# Patient Record
Sex: Male | Born: 1935 | Race: White | Hispanic: No | Smoking: Former smoker
Health system: Southern US, Community
[De-identification: ages and names within clinical notes are randomized; demographics above are authoritative.]

## PROBLEM LIST (undated history)

## (undated) DIAGNOSIS — I219 Acute myocardial infarction, unspecified: Secondary | ICD-10-CM

## (undated) DIAGNOSIS — E11621 Type 2 diabetes mellitus with foot ulcer: Secondary | ICD-10-CM

## (undated) DIAGNOSIS — E119 Type 2 diabetes mellitus without complications: Secondary | ICD-10-CM

## (undated) DIAGNOSIS — I2510 Atherosclerotic heart disease of native coronary artery without angina pectoris: Secondary | ICD-10-CM

## (undated) DIAGNOSIS — G629 Polyneuropathy, unspecified: Secondary | ICD-10-CM

## (undated) DIAGNOSIS — K219 Gastro-esophageal reflux disease without esophagitis: Secondary | ICD-10-CM

## (undated) DIAGNOSIS — T4145XA Adverse effect of unspecified anesthetic, initial encounter: Secondary | ICD-10-CM

## (undated) DIAGNOSIS — E559 Vitamin D deficiency, unspecified: Secondary | ICD-10-CM

## (undated) DIAGNOSIS — M810 Age-related osteoporosis without current pathological fracture: Secondary | ICD-10-CM

## (undated) DIAGNOSIS — E785 Hyperlipidemia, unspecified: Secondary | ICD-10-CM

## (undated) DIAGNOSIS — M1990 Unspecified osteoarthritis, unspecified site: Secondary | ICD-10-CM

## (undated) DIAGNOSIS — E042 Nontoxic multinodular goiter: Secondary | ICD-10-CM

## (undated) DIAGNOSIS — K633 Ulcer of intestine: Secondary | ICD-10-CM

## (undated) DIAGNOSIS — I209 Angina pectoris, unspecified: Secondary | ICD-10-CM

## (undated) DIAGNOSIS — T8859XA Other complications of anesthesia, initial encounter: Secondary | ICD-10-CM

## (undated) DIAGNOSIS — L97529 Non-pressure chronic ulcer of other part of left foot with unspecified severity: Secondary | ICD-10-CM

## (undated) HISTORY — PX: OTHER SURGICAL HISTORY: SHX169

## (undated) HISTORY — PX: CARPAL TUNNEL RELEASE: SHX101

## (undated) HISTORY — PX: JOINT REPLACEMENT: SHX530

## (undated) HISTORY — PX: SKIN LESION EXCISION: SHX2412

---

## 2011-12-06 HISTORY — PX: ABDOMINAL AORTIC ANEURYSM REPAIR: SUR1152

## 2018-05-23 ENCOUNTER — Other Ambulatory Visit: Payer: Self-pay | Admitting: Podiatry

## 2018-05-24 ENCOUNTER — Encounter
Admission: RE | Admit: 2018-05-24 | Discharge: 2018-05-24 | Disposition: A | Discharge status: Home / Self Care | Payer: Medicare Other | Source: Ambulatory Visit | Attending: Podiatry | Admitting: Podiatry

## 2018-05-24 ENCOUNTER — Other Ambulatory Visit: Payer: Self-pay

## 2018-05-24 DIAGNOSIS — Z01812 Encounter for preprocedural laboratory examination: Secondary | ICD-10-CM | POA: Diagnosis not present

## 2018-05-24 HISTORY — DX: Age-related osteoporosis without current pathological fracture: M81.0

## 2018-05-24 HISTORY — DX: Gastro-esophageal reflux disease without esophagitis: K21.9

## 2018-05-24 HISTORY — DX: Type 2 diabetes mellitus without complications: E11.9

## 2018-05-24 HISTORY — DX: Hyperlipidemia, unspecified: E78.5

## 2018-05-24 HISTORY — DX: Unspecified osteoarthritis, unspecified site: M19.90

## 2018-05-24 HISTORY — DX: Nontoxic multinodular goiter: E04.2

## 2018-05-24 HISTORY — DX: Vitamin D deficiency, unspecified: E55.9

## 2018-05-24 HISTORY — DX: Ulcer of intestine: K63.3

## 2018-05-24 LAB — BASIC METABOLIC PANEL
Anion gap: 8 (ref 5–15)
BUN: 15 mg/dL (ref 6–20)
CALCIUM: 9.8 mg/dL (ref 8.9–10.3)
CHLORIDE: 100 mmol/L — AB (ref 101–111)
CO2: 27 mmol/L (ref 22–32)
CREATININE: 0.68 mg/dL (ref 0.61–1.24)
GFR calc Af Amer: >60 mL/min (ref >60)
GFR calc non Af Amer: >60 mL/min (ref >60)
Glucose, Bld: 383 mg/dL — ABNORMAL HIGH (ref 65–99)
Potassium: 4.3 mmol/L (ref 3.5–5.1)
Sodium: 135 mmol/L (ref 135–145)

## 2018-05-24 NOTE — Pre-Procedure Instructions (Signed)
LABS/REQUEST FOR OPTIMIZATION CALLED AND FAXED TO DR B Suezanne JacquetSHANER. ALSO CALLED AND FAXED FYI TO DR Ether GriffinsFOWLER

## 2018-05-24 NOTE — Patient Instructions (Signed)
Your procedure is scheduled on: Friday 06/01/18 Report to Day Surgery. To find out your arrival time please call [phone] between 1PM - 3PM on Thurs. 6/27.  Remember: Instructions that are not followed completely may result in serious medical risk, up to and including death, or upon the discretion of your surgeon and anesthesiologist your surgery may need to be rescheduled.     _X__ 1. Do not eat food after midnight the night before your procedure.                 No gum chewing or hard candies. You may drink clear liquids up to 2 hours                 before you are scheduled to arrive for your surgery- DO not drink clear                 liquids within 2 hours of the start of your surgery.                 Clear Liquids include:  water,  __X__2.  On the morning of surgery brush your teeth with toothpaste and water, you may rinse your mouth with mouthwash if you wish.  Do not swallow any              toothpaste of mouthwash.     _X__ 3.  No Alcohol for 24 hours before or after surgery.   ___ 4.  Do Not Smoke or use e-cigarettes For 24 Hours Prior to Your Surgery.                 Do not use any chewable tobacco products for at least 6 hours prior to                 surgery.  ____  5.  Bring all medications with you on the day of surgery if instructed.   __x__  6.  Notify your doctor if there is any change in your medical condition      (cold, fever, infections).     Do not wear jewelry, make-up, hairpins, clips or nail polish. Do not wear lotions, powders, or perfumes. You may wear deodorant. Do not shave 48 hours prior to surgery. Men may shave face and neck. Do not bring valuables to the hospital.    [facility] is not responsible for any belongings or valuables.  Contacts, dentures or bridgework may not be worn into surgery. Leave your suitcase in the car. After surgery it may be brought to your room. For patients admitted to the hospital, discharge time  is determined by your treatment team.   Patients discharged the day of surgery will not be allowed to drive home.   Please read over the following fact sheets that you were given:    _x___ Take these medicines the morning of surgery with A SIP OF WATER:    1. Omeprazole take an extra dose the night before and morning of surgery  2. sertraline   3. atorvastatin  4.Lyrica  5.  6.  ____ Fleet Enema (as directed)   __x__ Use CHG Soap as directed  __x__ Use inhalers on the day of surgery albuterol and bring to hospital  ____ Stop metformin 2 days prior to surgery    ____ Take 1/2 of usual insulin dose the night before surgery. No insulin the morning          of surgery.   ____ Stop  Coumadin/Plavix/aspirin on   ____ Stop Anti-inflammatories on    ____ Stop supplements until after surgery.    ____ Bring C-Pap to the hospital.

## 2018-05-31 NOTE — Pre-Procedure Instructions (Signed)
NOTE IN CHART,PCP DID NOT CLEAR PATIENT AND NOTIFYING DR Antonietta BarcelonaFOWER

## 2018-06-08 ENCOUNTER — Ambulatory Visit: Admission: RE | Admit: 2018-06-08 | Payer: Medicare Other | Source: Ambulatory Visit | Admitting: Podiatry

## 2018-06-08 ENCOUNTER — Encounter: Admission: RE | Payer: Self-pay | Source: Ambulatory Visit

## 2018-06-08 SURGERY — LENGTHENING, TENDON
Anesthesia: Choice | Laterality: Left

## 2018-09-19 ENCOUNTER — Other Ambulatory Visit: Payer: Self-pay | Admitting: Podiatry

## 2018-09-25 ENCOUNTER — Other Ambulatory Visit: Payer: Self-pay

## 2018-09-25 ENCOUNTER — Encounter
Admission: RE | Admit: 2018-09-25 | Discharge: 2018-09-25 | Disposition: A | Discharge status: Home / Self Care | Payer: Medicare PPO | Source: Ambulatory Visit | Attending: Podiatry | Admitting: Podiatry

## 2018-09-25 DIAGNOSIS — Z880 Allergy status to penicillin: Secondary | ICD-10-CM | POA: Diagnosis not present

## 2018-09-25 DIAGNOSIS — Z87891 Personal history of nicotine dependence: Secondary | ICD-10-CM | POA: Diagnosis not present

## 2018-09-25 DIAGNOSIS — Z79899 Other long term (current) drug therapy: Secondary | ICD-10-CM | POA: Diagnosis not present

## 2018-09-25 DIAGNOSIS — E890 Postprocedural hypothyroidism: Secondary | ICD-10-CM | POA: Diagnosis not present

## 2018-09-25 DIAGNOSIS — Z882 Allergy status to sulfonamides status: Secondary | ICD-10-CM | POA: Diagnosis not present

## 2018-09-25 DIAGNOSIS — M810 Age-related osteoporosis without current pathological fracture: Secondary | ICD-10-CM | POA: Diagnosis not present

## 2018-09-25 DIAGNOSIS — L97522 Non-pressure chronic ulcer of other part of left foot with fat layer exposed: Secondary | ICD-10-CM

## 2018-09-25 DIAGNOSIS — E11621 Type 2 diabetes mellitus with foot ulcer: Secondary | ICD-10-CM | POA: Diagnosis present

## 2018-09-25 DIAGNOSIS — L97521 Non-pressure chronic ulcer of other part of left foot limited to breakdown of skin: Secondary | ICD-10-CM | POA: Diagnosis not present

## 2018-09-25 DIAGNOSIS — E1140 Type 2 diabetes mellitus with diabetic neuropathy, unspecified: Secondary | ICD-10-CM | POA: Diagnosis not present

## 2018-09-25 DIAGNOSIS — K219 Gastro-esophageal reflux disease without esophagitis: Secondary | ICD-10-CM | POA: Diagnosis not present

## 2018-09-25 DIAGNOSIS — Z01818 Encounter for other preprocedural examination: Secondary | ICD-10-CM

## 2018-09-25 DIAGNOSIS — Z888 Allergy status to other drugs, medicaments and biological substances status: Secondary | ICD-10-CM | POA: Diagnosis not present

## 2018-09-25 DIAGNOSIS — M216X2 Other acquired deformities of left foot: Secondary | ICD-10-CM | POA: Diagnosis present

## 2018-09-25 DIAGNOSIS — Z794 Long term (current) use of insulin: Secondary | ICD-10-CM | POA: Diagnosis not present

## 2018-09-25 DIAGNOSIS — Z902 Acquired absence of lung [part of]: Secondary | ICD-10-CM | POA: Diagnosis not present

## 2018-09-25 DIAGNOSIS — Z96651 Presence of right artificial knee joint: Secondary | ICD-10-CM | POA: Diagnosis not present

## 2018-09-25 DIAGNOSIS — E785 Hyperlipidemia, unspecified: Secondary | ICD-10-CM | POA: Diagnosis not present

## 2018-09-25 DIAGNOSIS — Z7982 Long term (current) use of aspirin: Secondary | ICD-10-CM | POA: Diagnosis not present

## 2018-09-25 DIAGNOSIS — Z7951 Long term (current) use of inhaled steroids: Secondary | ICD-10-CM | POA: Diagnosis not present

## 2018-09-25 DIAGNOSIS — I252 Old myocardial infarction: Secondary | ICD-10-CM | POA: Diagnosis not present

## 2018-09-25 DIAGNOSIS — I25118 Atherosclerotic heart disease of native coronary artery with other forms of angina pectoris: Secondary | ICD-10-CM | POA: Diagnosis not present

## 2018-09-25 HISTORY — DX: Acute myocardial infarction, unspecified: I21.9

## 2018-09-25 HISTORY — DX: Polyneuropathy, unspecified: G62.9

## 2018-09-25 HISTORY — DX: Type 2 diabetes mellitus with foot ulcer: E11.621

## 2018-09-25 HISTORY — DX: Angina pectoris, unspecified: I20.9

## 2018-09-25 HISTORY — DX: Non-pressure chronic ulcer of other part of left foot with unspecified severity: L97.529

## 2018-09-25 HISTORY — DX: Adverse effect of unspecified anesthetic, initial encounter: T41.45XA

## 2018-09-25 HISTORY — DX: Atherosclerotic heart disease of native coronary artery without angina pectoris: I25.10

## 2018-09-25 HISTORY — DX: Other complications of anesthesia, initial encounter: T88.59XA

## 2018-09-25 NOTE — Patient Instructions (Signed)
Your procedure is scheduled on: Friday 09/28/18 Report to [place]. To find out your arrival time please call [phone] between 1PM - 3PM on Thursday 09/27/18.  Remember: Instructions that are not followed completely may result in serious medical risk, up to and including death, or upon the discretion of your surgeon and anesthesiologist your surgery may need to be rescheduled.     _X__ 1. Do not eat food after midnight the night before your procedure.                 No gum chewing or hard candies. You may drink clear liquids up to 2 hours                 before you are scheduled to arrive for your surgery- DO not drink clear                 liquids within 2 hours of the start of your surgery.                 Clear Liquids include:  water, apple juice without pulp, clear carbohydrate                 drink such as Clearfast or Gatorade, Black Coffee or Tea (Do not add                 anything to coffee or tea).  __X__2.  On the morning of surgery brush your teeth with toothpaste and water, you                 may rinse your mouth with mouthwash if you wish.  Do not swallow any              toothpaste of mouthwash.     _X__ 3.  No Alcohol for 24 hours before or after surgery.   _X__ 4.  Do Not Smoke or use e-cigarettes For 24 Hours Prior to Your Surgery.                 Do not use any chewable tobacco products for at least 6 hours prior to                 surgery.  ____  5.  Bring all medications with you on the day of surgery if instructed.   __X__  6.  Notify your doctor if there is any change in your medical condition      (cold, fever, infections).     Do not wear jewelry, make-up, hairpins, clips or nail polish. Do not wear lotions, powders, or perfumes.  Do not shave 48 hours prior to surgery. Men may shave face and neck. Do not bring valuables to the hospital.    [facility] is not responsible for any belongings or  valuables.  Contacts, dentures/partials or body piercings may not be worn into surgery. Bring a case for your contacts, glasses or hearing aids, a denture cup will be supplied. Leave your suitcase in the car. After surgery it may be brought to your room. For patients admitted to the hospital, discharge time is determined by your treatment team.   Patients discharged the day of surgery will not be allowed to drive home.   Please read over the following fact sheets that you were given:   MRSA Information  __X__ Take these medicines the morning of surgery with A SIP OF WATER:  1. atorvastatin (LIPITOR)  2. omeprazole (PRILOSEC)   3. pregabalin (LYRICA)  4.  5.  6.  ____ Fleet Enema (as directed)   __X__ Use CHG Soap/SAGE wipes as directed  __X__ Use inhalers on the day of surgery  __X__ Stop metformin/Janumet/Farxiga 2 days prior to surgery    ____ Take 1/2 of usual insulin dose the night before surgery. No insulin the morning          of surgery.   ____ Stop Blood Thinners Coumadin/Plavix/Xarelto/Pleta/Pradaxa/Eliquis/Effient/Aspirin  on   Or contact your Surgeon, Cardiologist or Medical Doctor regarding  ability to stop your blood thinners  __X__ Stop Anti-inflammatories 7 days before surgery such as Advil, Ibuprofen, Motrin,  BC or Goodies Powder, Naprosyn, Naproxen, Aleve, Aspirin    __X__ Stop all herbal supplements, fish oil or vitamin E until after surgery.    ____ Bring C-Pap to the hospital.

## 2018-09-25 NOTE — Pre-Procedure Instructions (Addendum)
Labs from medicine clearance note 09/04/18  Lab Results  Component Value Date  HGBA1C 6.7 (H) 09/04/2018  HGBA1C >14.0 (H) 05/30/2018   Glucose today in clinic was 137  Lab Results  Component Value Date  CREATININE 0.9 05/31/2018  BUN 18 05/31/2018  NA 138 05/31/2018  K 4.2 05/31/2018  CL 100 05/31/2018  CO2 28 05/31/2018   Lab Results  Component Value Date  ALT 19 03/23/2018  AST 19 03/23/2018  ALKPHOS 61 03/23/2018   Lab Results  Component Value Date  WBC 8.4 03/23/2018  HGB 12.9 (L) 03/23/2018  HCT 39.3 03/23/2018  MCV 93 03/23/2018  PLT 161 03/23/2018   Lab Results  Component Value Date  DPCMALBCRERA <30 11/23/2017  MALBCREAT 4.2 07/21/2010  MALBDUAP 10 11/23/2017  MICROALBUR 3.4 07/21/2010

## 2018-09-25 NOTE — Pre-Procedure Instructions (Addendum)
NM Stress test in [facility] 08/2018  Component Name Value Ref Range  Vent Rate (bpm) 105   PR Interval (msec) 230   QRS Interval (msec) 120   QT Interval (msec) 348   QTc (msec) 459   Other Result Information  This result has an attachment that is not available.  Result Narrative  Sinus tachycardia 1st degree AV block Right bundle branch block Low voltage in frontal leads Nonspecific ST elevation Abnormal ECG  When compared with ECG of 24-Aug-2018 14:53, Sinus tachycardia has replaced Sinus rhythm Low voltage in frontal leads are now present I reviewed and concur with this report. Electronically signed WU:[email or web address], MD, PAUL [phone]) on 08/24/2018 8:59:35 PM

## 2018-09-27 MED ORDER — CLINDAMYCIN PHOSPHATE 900 MG/50ML IV SOLN
900.0000 mg | INTRAVENOUS | Status: AC
  Administered 2018-09-28: 900 mg via INTRAVENOUS

## 2018-09-28 ENCOUNTER — Ambulatory Visit: Payer: Medicare PPO | Admitting: Anesthesiology

## 2018-09-28 ENCOUNTER — Encounter: Payer: Self-pay | Admitting: *Deleted

## 2018-09-28 ENCOUNTER — Encounter
Admission: RE | Disposition: A | Discharge status: Home / Self Care | Payer: Self-pay | Source: Ambulatory Visit | Attending: Podiatry

## 2018-09-28 ENCOUNTER — Other Ambulatory Visit: Payer: Self-pay

## 2018-09-28 ENCOUNTER — Ambulatory Visit
Admission: RE | Admit: 2018-09-28 | Discharge: 2018-09-28 | Disposition: A | Discharge status: Home / Self Care | Payer: Medicare PPO | Source: Ambulatory Visit | Attending: Podiatry | Admitting: Podiatry

## 2018-09-28 DIAGNOSIS — Z7951 Long term (current) use of inhaled steroids: Secondary | ICD-10-CM | POA: Insufficient documentation

## 2018-09-28 DIAGNOSIS — Z888 Allergy status to other drugs, medicaments and biological substances status: Secondary | ICD-10-CM | POA: Insufficient documentation

## 2018-09-28 DIAGNOSIS — Z794 Long term (current) use of insulin: Secondary | ICD-10-CM | POA: Insufficient documentation

## 2018-09-28 DIAGNOSIS — E890 Postprocedural hypothyroidism: Secondary | ICD-10-CM | POA: Insufficient documentation

## 2018-09-28 DIAGNOSIS — E1140 Type 2 diabetes mellitus with diabetic neuropathy, unspecified: Secondary | ICD-10-CM | POA: Insufficient documentation

## 2018-09-28 DIAGNOSIS — Z7982 Long term (current) use of aspirin: Secondary | ICD-10-CM | POA: Insufficient documentation

## 2018-09-28 DIAGNOSIS — Z880 Allergy status to penicillin: Secondary | ICD-10-CM | POA: Insufficient documentation

## 2018-09-28 DIAGNOSIS — M810 Age-related osteoporosis without current pathological fracture: Secondary | ICD-10-CM | POA: Insufficient documentation

## 2018-09-28 DIAGNOSIS — E11621 Type 2 diabetes mellitus with foot ulcer: Secondary | ICD-10-CM | POA: Insufficient documentation

## 2018-09-28 DIAGNOSIS — I252 Old myocardial infarction: Secondary | ICD-10-CM | POA: Insufficient documentation

## 2018-09-28 DIAGNOSIS — E785 Hyperlipidemia, unspecified: Secondary | ICD-10-CM | POA: Insufficient documentation

## 2018-09-28 DIAGNOSIS — Z902 Acquired absence of lung [part of]: Secondary | ICD-10-CM | POA: Insufficient documentation

## 2018-09-28 DIAGNOSIS — Z882 Allergy status to sulfonamides status: Secondary | ICD-10-CM | POA: Insufficient documentation

## 2018-09-28 DIAGNOSIS — Z87891 Personal history of nicotine dependence: Secondary | ICD-10-CM | POA: Insufficient documentation

## 2018-09-28 DIAGNOSIS — L97521 Non-pressure chronic ulcer of other part of left foot limited to breakdown of skin: Secondary | ICD-10-CM | POA: Insufficient documentation

## 2018-09-28 DIAGNOSIS — Z79899 Other long term (current) drug therapy: Secondary | ICD-10-CM | POA: Insufficient documentation

## 2018-09-28 DIAGNOSIS — M216X2 Other acquired deformities of left foot: Secondary | ICD-10-CM | POA: Diagnosis not present

## 2018-09-28 DIAGNOSIS — Z96651 Presence of right artificial knee joint: Secondary | ICD-10-CM | POA: Insufficient documentation

## 2018-09-28 DIAGNOSIS — I25118 Atherosclerotic heart disease of native coronary artery with other forms of angina pectoris: Secondary | ICD-10-CM | POA: Insufficient documentation

## 2018-09-28 DIAGNOSIS — K219 Gastro-esophageal reflux disease without esophagitis: Secondary | ICD-10-CM | POA: Insufficient documentation

## 2018-09-28 HISTORY — PX: WOUND DEBRIDEMENT: SHX247

## 2018-09-28 HISTORY — PX: ACHILLES TENDON SURGERY: SHX542

## 2018-09-28 LAB — GLUCOSE, CAPILLARY
Glucose-Capillary: 126 mg/dL — ABNORMAL HIGH (ref 70–99)
Glucose-Capillary: 135 mg/dL — ABNORMAL HIGH (ref 70–99)

## 2018-09-28 LAB — CBC
HEMATOCRIT: 37 % — AB (ref 39.0–52.0)
HEMOGLOBIN: 11.7 g/dL — AB (ref 13.0–17.0)
MCH: 29.5 pg (ref 26.0–34.0)
MCHC: 31.6 g/dL (ref 30.0–36.0)
MCV: 93.4 fL (ref 80.0–100.0)
Platelets: 242 10*3/uL (ref 150–400)
RBC: 3.96 MIL/uL — AB (ref 4.22–5.81)
RDW: 15.5 % (ref 11.5–15.5)
WBC: 7.3 10*3/uL (ref 4.0–10.5)
nRBC: 0 % (ref 0.0–0.2)

## 2018-09-28 LAB — BASIC METABOLIC PANEL
Anion gap: 9 (ref 5–15)
BUN: 28 mg/dL — AB (ref 8–23)
CHLORIDE: 108 mmol/L (ref 98–111)
CO2: 27 mmol/L (ref 22–32)
CREATININE: 0.83 mg/dL (ref 0.61–1.24)
Calcium: 9.7 mg/dL (ref 8.9–10.3)
GFR calc Af Amer: >60 mL/min (ref >60)
GFR calc non Af Amer: >60 mL/min (ref >60)
Glucose, Bld: 153 mg/dL — ABNORMAL HIGH (ref 70–99)
Potassium: 4 mmol/L (ref 3.5–5.1)
SODIUM: 144 mmol/L (ref 135–145)

## 2018-09-28 SURGERY — TENOTOMY, ACHILLES
Anesthesia: Choice | Laterality: Left

## 2018-09-28 MED ORDER — BUPIVACAINE HCL 0.5 % IJ SOLN
INTRAMUSCULAR | Status: DC | PRN
  Administered 2018-09-28: 2.5 mL

## 2018-09-28 MED ORDER — HYDROCODONE-ACETAMINOPHEN 5-325 MG PO TABS: 1.0000 | ORAL_TABLET | Freq: Four times a day (QID) | ORAL | 0 refills | Status: DC | PRN

## 2018-09-28 MED ORDER — ONDANSETRON HCL 4 MG PO TABS: 4.0000 mg | ORAL_TABLET | Freq: Four times a day (QID) | ORAL | Status: DC | PRN

## 2018-09-28 MED ORDER — BUPIVACAINE HCL (PF) 0.5 % IJ SOLN
INTRAMUSCULAR | Status: AC
  Filled 2018-09-28: qty 30

## 2018-09-28 MED ORDER — FENTANYL CITRATE (PF) 100 MCG/2ML IJ SOLN
INTRAMUSCULAR | Status: AC
  Filled 2018-09-28: qty 2

## 2018-09-28 MED ORDER — FENTANYL CITRATE (PF) 100 MCG/2ML IJ SOLN
INTRAMUSCULAR | Status: DC | PRN
  Administered 2018-09-28 (×2): 25 ug via INTRAVENOUS

## 2018-09-28 MED ORDER — POVIDONE-IODINE 7.5 % EX SOLN
Freq: Once | CUTANEOUS | Status: DC
  Filled 2018-09-28: qty 118

## 2018-09-28 MED ORDER — BUPIVACAINE HCL (PF) 0.25 % IJ SOLN
INTRAMUSCULAR | Status: AC
  Filled 2018-09-28: qty 30

## 2018-09-28 MED ORDER — LIDOCAINE HCL (CARDIAC) PF 100 MG/5ML IV SOSY
PREFILLED_SYRINGE | INTRAVENOUS | Status: DC | PRN
  Administered 2018-09-28: 50 mg via INTRAVENOUS

## 2018-09-28 MED ORDER — ONDANSETRON HCL 4 MG/2ML IJ SOLN: 4.0000 mg | Freq: Once | INTRAMUSCULAR | Status: DC | PRN

## 2018-09-28 MED ORDER — LIDOCAINE-EPINEPHRINE 1 %-1:100000 IJ SOLN
INTRAMUSCULAR | Status: DC | PRN
  Administered 2018-09-28: 2.5 mL

## 2018-09-28 MED ORDER — PROPOFOL 500 MG/50ML IV EMUL
INTRAVENOUS | Status: DC | PRN
  Administered 2018-09-28: 55 ug/kg/min via INTRAVENOUS

## 2018-09-28 MED ORDER — LIDOCAINE HCL (PF) 1 % IJ SOLN
INTRAMUSCULAR | Status: AC
  Filled 2018-09-28: qty 30

## 2018-09-28 MED ORDER — FENTANYL CITRATE (PF) 100 MCG/2ML IJ SOLN: 25.0000 ug | INTRAMUSCULAR | Status: DC | PRN

## 2018-09-28 MED ORDER — SODIUM CHLORIDE 0.9 % IV SOLN
INTRAVENOUS | Status: DC
  Administered 2018-09-28: 08:00:00 via INTRAVENOUS

## 2018-09-28 MED ORDER — ONDANSETRON HCL 4 MG/2ML IJ SOLN: 4.0000 mg | Freq: Four times a day (QID) | INTRAMUSCULAR | Status: DC | PRN

## 2018-09-28 MED ORDER — LIDOCAINE HCL (PF) 2 % IJ SOLN
INTRAMUSCULAR | Status: AC
  Filled 2018-09-28: qty 10

## 2018-09-28 MED ORDER — PROPOFOL 500 MG/50ML IV EMUL
INTRAVENOUS | Status: AC
  Filled 2018-09-28: qty 50

## 2018-09-28 MED ORDER — CLINDAMYCIN PHOSPHATE 900 MG/50ML IV SOLN
INTRAVENOUS | Status: AC
  Filled 2018-09-28: qty 50

## 2018-09-28 MED ORDER — PROPOFOL 10 MG/ML IV BOLUS
INTRAVENOUS | Status: AC
  Filled 2018-09-28: qty 20

## 2018-09-28 SURGICAL SUPPLY — 85 items
BANDAGE ACE 4X5 VEL STRL LF (GAUZE/BANDAGES/DRESSINGS) ×2 IMPLANT
BANDAGE ELASTIC 4 LF NS (GAUZE/BANDAGES/DRESSINGS) ×4 IMPLANT
BLADE OSC/SAGITTAL MD 5.5X18 (BLADE) IMPLANT
BLADE OSCILLATING/SAGITTAL (BLADE)
BLADE SURG 15 STRL LF DISP TIS (BLADE) ×2 IMPLANT
BLADE SURG 15 STRL SS (BLADE) ×2
BLADE SURG MINI STRL (BLADE) ×2 IMPLANT
BLADE SW THK.38XMED LNG THN (BLADE) IMPLANT
BNDG COHESIVE 4X5 TAN STRL (GAUZE/BANDAGES/DRESSINGS) ×2 IMPLANT
BNDG COHESIVE 6X5 TAN STRL LF (GAUZE/BANDAGES/DRESSINGS) IMPLANT
BNDG CONFORM 3 STRL LF (GAUZE/BANDAGES/DRESSINGS) ×2 IMPLANT
BNDG ESMARK 4X12 TAN STRL LF (GAUZE/BANDAGES/DRESSINGS) ×2 IMPLANT
BNDG ESMARK 6X12 TAN STRL LF (GAUZE/BANDAGES/DRESSINGS) ×2 IMPLANT
BNDG GAUZE 4.5X4.1 6PLY STRL (MISCELLANEOUS) ×2 IMPLANT
CANISTER SUCT 1200ML W/VALVE (MISCELLANEOUS) ×2 IMPLANT
CANISTER SUCT 3000ML PPV (MISCELLANEOUS) ×2 IMPLANT
COVER WAND RF STERILE (DRAPES) ×2 IMPLANT
CUFF TOURN 18 STER (MISCELLANEOUS) IMPLANT
CUFF TOURN DUAL PL 12 NO SLV (MISCELLANEOUS) IMPLANT
DRAPE FLUOR MINI C-ARM 54X84 (DRAPES) ×2 IMPLANT
DRAPE XRAY CASSETTE 23X24 (DRAPES) IMPLANT
DRESSING ALLEVYN 4X4 (MISCELLANEOUS) IMPLANT
DURAPREP 26ML APPLICATOR (WOUND CARE) ×2 IMPLANT
ELECT REM PT RETURN 9FT ADLT (ELECTROSURGICAL) ×2
ELECTRODE REM PT RTRN 9FT ADLT (ELECTROSURGICAL) ×1 IMPLANT
GAUZE PACKING 1/4 X5 YD (GAUZE/BANDAGES/DRESSINGS) ×2 IMPLANT
GAUZE PACKING IODOFORM 1X5 (MISCELLANEOUS) ×2 IMPLANT
GAUZE PETRO XEROFOAM 1X8 (MISCELLANEOUS) ×2 IMPLANT
GAUZE SPONGE 4X4 12PLY STRL (GAUZE/BANDAGES/DRESSINGS) ×2 IMPLANT
GAUZE STRETCH 2X75IN STRL (MISCELLANEOUS) ×2 IMPLANT
GLOVE BIO SURGEON STRL SZ7.5 (GLOVE) ×2 IMPLANT
GLOVE INDICATOR 8.0 STRL GRN (GLOVE) ×2 IMPLANT
GOWN STRL REUS W/ TWL LRG LVL3 (GOWN DISPOSABLE) ×2 IMPLANT
GOWN STRL REUS W/TWL LRG LVL3 (GOWN DISPOSABLE) ×2
GOWN STRL REUS W/TWL MED LVL3 (GOWN DISPOSABLE) ×4 IMPLANT
HANDLE YANKAUER SUCT BULB TIP (MISCELLANEOUS) ×2 IMPLANT
HANDPIECE VERSAJET DEBRIDEMENT (MISCELLANEOUS) IMPLANT
IV NS 1000ML (IV SOLUTION) ×1
IV NS 1000ML BAXH (IV SOLUTION) ×1 IMPLANT
KIT TURNOVER KIT A (KITS) ×2 IMPLANT
LABEL OR SOLS (LABEL) IMPLANT
NDL MAYO CATGUT SZ5 (NEEDLE) ×1
NDL SUT 5 .5 CRC TPR PNT MAYO (NEEDLE) ×1 IMPLANT
NEEDLE FILTER BLUNT 18X 1/2SAF (NEEDLE) ×1
NEEDLE FILTER BLUNT 18X1 1/2 (NEEDLE) ×1 IMPLANT
NEEDLE HYPO 25X1 1.5 SAFETY (NEEDLE) ×6 IMPLANT
NS IRRIG 500ML POUR BTL (IV SOLUTION) ×2 IMPLANT
PACK EXTREMITY ARMC (MISCELLANEOUS) ×2 IMPLANT
PAD ABD DERMACEA PRESS 5X9 (GAUZE/BANDAGES/DRESSINGS) ×2 IMPLANT
PAD CAST CTTN 4X4 STRL (SOFTGOODS) ×1 IMPLANT
PADDING CAST COTTON 4X4 STRL (SOFTGOODS) ×1
PULSAVAC PLUS IRRIG FAN TIP (DISPOSABLE)
RASP SM TEAR CROSS CUT (RASP) IMPLANT
SHIELD FULL FACE ANTIFOG 7M (MISCELLANEOUS) IMPLANT
SOL .9 NS 3000ML IRR  AL (IV SOLUTION) ×1
SOL .9 NS 3000ML IRR UROMATIC (IV SOLUTION) ×1 IMPLANT
SOL PREP PVP 2OZ (MISCELLANEOUS) ×2
SOLUTION PREP PVP 2OZ (MISCELLANEOUS) ×1 IMPLANT
SPLINT CAST 1 STEP 5X30 WHT (MISCELLANEOUS) ×2 IMPLANT
SPLINT FAST PLASTER 5X30 (CAST SUPPLIES) ×1
SPLINT PLASTER CAST FAST 5X30 (CAST SUPPLIES) ×1 IMPLANT
SPONGE LAP 18X18 RF (DISPOSABLE) ×2 IMPLANT
STOCKINETTE IMPERVIOUS 9X36 MD (GAUZE/BANDAGES/DRESSINGS) ×2 IMPLANT
STOCKINETTE M/LG 89821 (MISCELLANEOUS) ×2 IMPLANT
STRIP CLOSURE SKIN 1/2X4 (GAUZE/BANDAGES/DRESSINGS) ×2 IMPLANT
SUT ETHILON 2 0 FS 18 (SUTURE) IMPLANT
SUT ETHILON 4-0 (SUTURE) ×1
SUT ETHILON 4-0 FS2 18XMFL BLK (SUTURE) ×1
SUT MNCRL+ 5-0 VIOLET P-3 (SUTURE) ×1 IMPLANT
SUT MONOCRYL 5-0 (SUTURE) ×1
SUT PDS AB 0 CT1 27 (SUTURE) IMPLANT
SUT VIC AB 0 SH 27 (SUTURE) ×2 IMPLANT
SUT VIC AB 2-0 SH 27 (SUTURE) ×2
SUT VIC AB 2-0 SH 27XBRD (SUTURE) ×2 IMPLANT
SUT VIC AB 3-0 SH 27 (SUTURE) ×1
SUT VIC AB 3-0 SH 27X BRD (SUTURE) ×1 IMPLANT
SUT VIC AB 4-0 FS2 27 (SUTURE) ×2 IMPLANT
SUT VICRYL AB 3-0 FS1 BRD 27IN (SUTURE) ×2 IMPLANT
SUTURE ETHLN 4-0 FS2 18XMF BLK (SUTURE) ×1 IMPLANT
SWAB CULTURE AMIES ANAERIB BLU (MISCELLANEOUS) IMPLANT
SWABSTK COMLB BENZOIN TINCTURE (MISCELLANEOUS) ×2 IMPLANT
SYR 10ML LL (SYRINGE) ×4 IMPLANT
SYR 3ML LL SCALE MARK (SYRINGE) ×2 IMPLANT
TIP FAN IRRIG PULSAVAC PLUS (DISPOSABLE) IMPLANT
WIRE MAGNUM (SUTURE) ×2 IMPLANT

## 2018-09-28 NOTE — Op Note (Signed)
Operative note   Surgeon:Nekeisha Aure Armed forces logistics/support/administrative officer: None    Preop diagnosis: 1.  Equinus left lower extremity 2.  Superficial ulcer left first MTPJ    Postop diagnosis: Same    Procedure: 1.  Percutaneous tendo Achilles lengthening left lower extremity 2.  Excisional debridement superficial ulcer left first MTPJ    EBL: Minimal    Anesthesia:regional and IV sedation.  Local consisted of a one-to-one mixture of 0.5% plain bupivacaine and 1% lidocaine with epinephrine.  A total of 10 cc was used to all areas    Hemostasis: Epinephrine infiltrated along the incision site    Specimen: None    Complications: None    Operative indications:Rodney Kelly is an 84 y.o. that presents today for surgical intervention.  The risks/benefits/alternatives/complications have been discussed and consent has been given.    Procedure:  Patient was brought into the OR and placed on the operating table in thesupine position. After anesthesia was obtained theleft lower extremity was prepped and draped in usual sterile fashion.  Attention was directed to the posterior aspect of the Achilles tendon at its insertion where at 1, 3, and 5 cm proximal to its insertion 3 hemisections were created.  The proximal and distal hemisections were laterally based in the central was medially based.  Excursion of the Achilles tendon was noted with dorsiflexion of the foot.  Good release was palpated and noted with dorsiflexion of the foot.  At this time the skin was then closed with a 4-0 nylon.  Tension was directed to the plantar aspect of the foot where excisional debridement was performed with a 15 blade down to healthy bleeding tissue.  The wound only measures about 3 mm in diameter at this time was surrounding nonviable hyperkeratotic and fibrotic tissue.  No signs of infection noted.  A bulky sterile bandage was applied and patient was then placed in a equalizer walker boot with the foot at 90 degrees.   Patient tolerated the procedure and anesthesia well.  Was transported from the OR to the PACU with all vital signs stable and vascular status intact. To be discharged per routine protocol.  Will follow up in approximately 1 week in the outpatient clinic.

## 2018-09-28 NOTE — Anesthesia Postprocedure Evaluation (Signed)
Anesthesia Post Note  Patient: Zedekiah Hinderman  Procedure(s) Performed: ACHILLES TENDON MICROTENOTOMY-TAL (Left ) DEBRIDEMENT WOUND-SKIN,SUBCUTANEOUS TISSUE (Left )  Patient location during evaluation: PACU Anesthesia Type: General Level of consciousness: awake and alert Pain management: pain level controlled Vital Signs Assessment: post-procedure vital signs reviewed and stable Respiratory status: spontaneous breathing, nonlabored ventilation, respiratory function stable and patient connected to nasal cannula oxygen Cardiovascular status: blood pressure returned to baseline and stable Postop Assessment: no apparent nausea or vomiting Anesthetic complications: no     Last Vitals:  Vitals:   09/28/18 1002 09/28/18 1007  BP: 123/66   Pulse: 79 79  Resp: 18 15  Temp:  36.6 C  SpO2: 100% 100%    Last Pain:  Vitals:   09/28/18 1002  TempSrc:   PainSc: 0-No pain    LLE Motor Response: Purposeful movement (09/28/18 1002)            Yevette Edwards

## 2018-09-28 NOTE — Anesthesia Post-op Follow-up Note (Signed)
Anesthesia QCDR form completed.        

## 2018-09-28 NOTE — Discharge Instructions (Signed)
[facility]  POST OPERATIVE INSTRUCTIONS FOR DR. TROXLER AND DR. FOWLER [facility]   1. Take your medication as prescribed.  Pain medication should be taken only as needed.  2. Keep the dressing clean, dry and intact.  3. Keep your foot elevated above the heart level for the first 48 hours.  4. Walking to the bathroom and brief periods of walking are acceptable, unless we have instructed you to be non-weight bearing.  5. Always wear your post-op shoe when walking.  Always use your crutches if you are to be non-weight bearing.  6. Do not take a shower. Baths are permissible as long as the foot is kept out of the water.   7. Every hour you are awake:  - Bend your knee 15 times. - Flex foot 15 times - Massage calf 15 times  8. Call [facility] ([phone]) if any of the following problems occur: - You develop a temperature or fever. - The bandage becomes saturated with blood. - Medication does not stop your pain. - Injury of the foot occurs. - Any symptoms of infection including redness, odor, or red streaks running from wound.   AMBULATORY SURGERY  DISCHARGE INSTRUCTIONS   1) The drugs that you were given will stay in your system until tomorrow so for the next 24 hours you should not:  A) Drive an automobile B) Make any legal decisions C) Drink any alcoholic beverage   2) You may resume regular meals tomorrow.  Today it is better to start with liquids and gradually work up to solid foods.  You may eat anything you prefer, but it is better to start with liquids, then soup and crackers, and gradually work up to solid foods.   3) Please notify your doctor immediately if you have any unusual bleeding, trouble breathing, redness and pain at the surgery site, drainage, fever, or pain not relieved by medication.    4) Additional Instructions: TAKE A STOOL SOFTENER TWICE A DAY WHILE TAKING NARCOTIC  PAIN MEDICINE TO PREVENT CONSTIPATION   Please contact your physician with any problems or Same Day Surgery at [phone], Monday through Friday 6 am to 4 pm, or [facility] [place] [facility] number at [phone].      

## 2018-09-28 NOTE — Transfer of Care (Signed)
Immediate Anesthesia Transfer of Care Note  Patient: Rodney Kelly  Procedure(s) Performed: ACHILLES TENDON MICROTENOTOMY-TAL (Left ) DEBRIDEMENT WOUND-SKIN,SUBCUTANEOUS TISSUE (Left )  Patient Location: PACU  Anesthesia Type:MAC  Level of Consciousness: awake and responds to stimulation  Airway & Oxygen Therapy: Patient Spontanous Breathing and Patient connected to face mask oxygen  Post-op Assessment: Report given to RN and Post -op Vital signs reviewed and stable  Post vital signs: Reviewed and stable  Last Vitals:  Vitals Value Taken Time  BP 112/69 09/28/2018  9:31 AM  Temp    Pulse 79 09/28/2018  9:32 AM  Resp 16 09/28/2018  9:32 AM  SpO2 100 % 09/28/2018  9:32 AM  Vitals shown include unvalidated device data.  Last Pain:  Vitals:   09/28/18 0747  TempSrc: Temporal  PainSc: 0-No pain         Complications: No apparent anesthesia complications

## 2018-09-28 NOTE — H&P (Signed)
HISTORY AND PHYSICAL INTERVAL NOTE:  09/28/2018  8:36 AM  Rodney Kelly  has presented today for surgery, with the diagnosis of Skin ulcer of lt foot Gastrocnemius equinus-left.  The various methods of treatment have been discussed with the patient.  No guarantees were given.  After consideration of risks, benefits and other options for treatment, the patient has consented to surgery.  I have reviewed the patients' chart and labs.     A history and physical examination was performed in my office.  The patient was reexamined.  There have been no changes to this history and physical examination.  Gwyneth Revels A

## 2018-09-28 NOTE — Anesthesia Preprocedure Evaluation (Signed)
Anesthesia Evaluation  Patient identified by MRN, date of birth, ID band Patient awake    Reviewed: Allergy & Precautions, H&P , NPO status , Patient's Chart, lab work & pertinent test results, reviewed documented beta blocker date and time   History of Anesthesia Complications (+) history of anesthetic complications  Airway Mallampati: III   Neck ROM: full    Dental  (+) Poor Dentition   Pulmonary neg pulmonary ROS, former smoker,    Pulmonary exam normal        Cardiovascular Exercise Tolerance: Poor + angina with exertion + CAD and + Past MI  negative cardio ROS Normal cardiovascular exam Rhythm:regular Rate:Normal     Neuro/Psych  Neuromuscular disease negative neurological ROS  negative psych ROS   GI/Hepatic negative GI ROS, Neg liver ROS, PUD, GERD  ,  Endo/Other  negative endocrine ROSdiabetes, Well Controlled, Type 2, Oral Hypoglycemic Agents  Renal/GU negative Renal ROS  negative genitourinary   Musculoskeletal   Abdominal   Peds  Hematology negative hematology ROS (+)   Anesthesia Other Findings Past Medical History: No date: Anginal pain (HCC) No date: Arthritis No date: Complication of anesthesia     Comment:  spinal anesthesia caused nerve damage in 2002 No date: Coronary artery disease No date: Diabetes mellitus without complication (HCC)     Comment:  md stopped metformin No date: Diabetic ulcer of left foot (HCC) No date: GERD (gastroesophageal reflux disease) No date: Hyperlipemia No date: Multiple thyroid nodules No date: Myocardial infarction (HCC)     Comment:  STEMI No date: Osteoporosis No date: Peripheral nerve disease No date: Ulceration of colon     Comment:  NSAIDS No date: Vitamin D deficiency Past Surgical History: 2013: ABDOMINAL AORTIC ANEURYSM REPAIR No date: CARPAL TUNNEL RELEASE; Right No date: excision of benign skin lesion No date: JOINT REPLACEMENT     Comment:   right knee No date: SKIN LESION EXCISION No date: thyroid lobectomy; Right BMI    Body Mass Index:  20.66 kg/m     Reproductive/Obstetrics negative OB ROS                             Anesthesia Physical Anesthesia Plan  ASA: IV  Anesthesia Plan: General   Post-op Pain Management:    Induction:   PONV Risk Score and Plan:   Airway Management Planned:   Additional Equipment:   Intra-op Plan:   Post-operative Plan:   Informed Consent: I have reviewed the patients History and Physical, chart, labs and discussed the procedure including the risks, benefits and alternatives for the proposed anesthesia with the patient or authorized representative who has indicated his/her understanding and acceptance.   Dental Advisory Given  Plan Discussed with: CRNA  Anesthesia Plan Comments:         Anesthesia Quick Evaluation

## 2018-09-29 ENCOUNTER — Encounter: Payer: Self-pay | Admitting: Podiatry

## 2021-05-19 ENCOUNTER — Inpatient Hospital Stay: Payer: Medicare PPO

## 2021-05-19 ENCOUNTER — Inpatient Hospital Stay
Admission: EM | Admit: 2021-05-19 | Discharge: 2021-06-08 | DRG: 616 | Disposition: A | Discharge status: Skilled Nursing Facility | Payer: Medicare PPO | Attending: Internal Medicine | Admitting: Internal Medicine

## 2021-05-19 ENCOUNTER — Emergency Department: Payer: Medicare PPO

## 2021-05-19 ENCOUNTER — Other Ambulatory Visit: Payer: Self-pay

## 2021-05-19 DIAGNOSIS — E11621 Type 2 diabetes mellitus with foot ulcer: Secondary | ICD-10-CM | POA: Diagnosis present

## 2021-05-19 DIAGNOSIS — L089 Local infection of the skin and subcutaneous tissue, unspecified: Secondary | ICD-10-CM | POA: Diagnosis not present

## 2021-05-19 DIAGNOSIS — E119 Type 2 diabetes mellitus without complications: Secondary | ICD-10-CM

## 2021-05-19 DIAGNOSIS — Z882 Allergy status to sulfonamides status: Secondary | ICD-10-CM

## 2021-05-19 DIAGNOSIS — L270 Generalized skin eruption due to drugs and medicaments taken internally: Secondary | ICD-10-CM | POA: Diagnosis not present

## 2021-05-19 DIAGNOSIS — M86671 Other chronic osteomyelitis, right ankle and foot: Secondary | ICD-10-CM | POA: Diagnosis not present

## 2021-05-19 DIAGNOSIS — Z7984 Long term (current) use of oral hypoglycemic drugs: Secondary | ICD-10-CM

## 2021-05-19 DIAGNOSIS — R7989 Other specified abnormal findings of blood chemistry: Secondary | ICD-10-CM

## 2021-05-19 DIAGNOSIS — Z681 Body mass index (BMI) 19 or less, adult: Secondary | ICD-10-CM

## 2021-05-19 DIAGNOSIS — D649 Anemia, unspecified: Secondary | ICD-10-CM | POA: Diagnosis present

## 2021-05-19 DIAGNOSIS — E1142 Type 2 diabetes mellitus with diabetic polyneuropathy: Secondary | ICD-10-CM | POA: Diagnosis present

## 2021-05-19 DIAGNOSIS — L97509 Non-pressure chronic ulcer of other part of unspecified foot with unspecified severity: Secondary | ICD-10-CM | POA: Diagnosis present

## 2021-05-19 DIAGNOSIS — F05 Delirium due to known physiological condition: Secondary | ICD-10-CM | POA: Diagnosis present

## 2021-05-19 DIAGNOSIS — E890 Postprocedural hypothyroidism: Secondary | ICD-10-CM | POA: Diagnosis present

## 2021-05-19 DIAGNOSIS — Z880 Allergy status to penicillin: Secondary | ICD-10-CM

## 2021-05-19 DIAGNOSIS — B9561 Methicillin susceptible Staphylococcus aureus infection as the cause of diseases classified elsewhere: Secondary | ICD-10-CM | POA: Diagnosis present

## 2021-05-19 DIAGNOSIS — L03116 Cellulitis of left lower limb: Secondary | ICD-10-CM | POA: Diagnosis present

## 2021-05-19 DIAGNOSIS — R339 Retention of urine, unspecified: Secondary | ICD-10-CM | POA: Diagnosis present

## 2021-05-19 DIAGNOSIS — K219 Gastro-esophageal reflux disease without esophagitis: Secondary | ICD-10-CM | POA: Diagnosis present

## 2021-05-19 DIAGNOSIS — I2510 Atherosclerotic heart disease of native coronary artery without angina pectoris: Secondary | ICD-10-CM | POA: Diagnosis not present

## 2021-05-19 DIAGNOSIS — G928 Other toxic encephalopathy: Secondary | ICD-10-CM | POA: Diagnosis present

## 2021-05-19 DIAGNOSIS — F0390 Unspecified dementia without behavioral disturbance: Secondary | ICD-10-CM | POA: Diagnosis present

## 2021-05-19 DIAGNOSIS — T148XXA Other injury of unspecified body region, initial encounter: Secondary | ICD-10-CM | POA: Diagnosis not present

## 2021-05-19 DIAGNOSIS — R000 Tachycardia, unspecified: Secondary | ICD-10-CM | POA: Diagnosis present

## 2021-05-19 DIAGNOSIS — L03115 Cellulitis of right lower limb: Secondary | ICD-10-CM | POA: Diagnosis present

## 2021-05-19 DIAGNOSIS — Z20822 Contact with and (suspected) exposure to covid-19: Secondary | ICD-10-CM | POA: Diagnosis present

## 2021-05-19 DIAGNOSIS — R0902 Hypoxemia: Secondary | ICD-10-CM | POA: Diagnosis not present

## 2021-05-19 DIAGNOSIS — D638 Anemia in other chronic diseases classified elsewhere: Secondary | ICD-10-CM | POA: Diagnosis present

## 2021-05-19 DIAGNOSIS — E1151 Type 2 diabetes mellitus with diabetic peripheral angiopathy without gangrene: Secondary | ICD-10-CM | POA: Diagnosis present

## 2021-05-19 DIAGNOSIS — E876 Hypokalemia: Secondary | ICD-10-CM | POA: Diagnosis not present

## 2021-05-19 DIAGNOSIS — E118 Type 2 diabetes mellitus with unspecified complications: Secondary | ICD-10-CM | POA: Diagnosis present

## 2021-05-19 DIAGNOSIS — Z87891 Personal history of nicotine dependence: Secondary | ICD-10-CM

## 2021-05-19 DIAGNOSIS — M609 Myositis, unspecified: Secondary | ICD-10-CM | POA: Diagnosis present

## 2021-05-19 DIAGNOSIS — E1169 Type 2 diabetes mellitus with other specified complication: Secondary | ICD-10-CM | POA: Diagnosis present

## 2021-05-19 DIAGNOSIS — Z79899 Other long term (current) drug therapy: Secondary | ICD-10-CM

## 2021-05-19 DIAGNOSIS — E43 Unspecified severe protein-calorie malnutrition: Secondary | ICD-10-CM | POA: Insufficient documentation

## 2021-05-19 DIAGNOSIS — L97529 Non-pressure chronic ulcer of other part of left foot with unspecified severity: Secondary | ICD-10-CM | POA: Diagnosis present

## 2021-05-19 DIAGNOSIS — I2583 Coronary atherosclerosis due to lipid rich plaque: Secondary | ICD-10-CM | POA: Diagnosis not present

## 2021-05-19 DIAGNOSIS — M869 Osteomyelitis, unspecified: Secondary | ICD-10-CM | POA: Diagnosis present

## 2021-05-19 DIAGNOSIS — I4510 Unspecified right bundle-branch block: Secondary | ICD-10-CM | POA: Diagnosis present

## 2021-05-19 DIAGNOSIS — Z8679 Personal history of other diseases of the circulatory system: Secondary | ICD-10-CM

## 2021-05-19 DIAGNOSIS — E11628 Type 2 diabetes mellitus with other skin complications: Principal | ICD-10-CM | POA: Diagnosis present

## 2021-05-19 DIAGNOSIS — M86171 Other acute osteomyelitis, right ankle and foot: Secondary | ICD-10-CM | POA: Diagnosis not present

## 2021-05-19 DIAGNOSIS — T361X5A Adverse effect of cephalosporins and other beta-lactam antibiotics, initial encounter: Secondary | ICD-10-CM | POA: Diagnosis not present

## 2021-05-19 DIAGNOSIS — R410 Disorientation, unspecified: Secondary | ICD-10-CM | POA: Diagnosis not present

## 2021-05-19 DIAGNOSIS — E559 Vitamin D deficiency, unspecified: Secondary | ICD-10-CM | POA: Diagnosis present

## 2021-05-19 DIAGNOSIS — L97429 Non-pressure chronic ulcer of left heel and midfoot with unspecified severity: Secondary | ICD-10-CM | POA: Diagnosis present

## 2021-05-19 DIAGNOSIS — M810 Age-related osteoporosis without current pathological fracture: Secondary | ICD-10-CM | POA: Diagnosis present

## 2021-05-19 DIAGNOSIS — J690 Pneumonitis due to inhalation of food and vomit: Secondary | ICD-10-CM | POA: Diagnosis present

## 2021-05-19 DIAGNOSIS — R1312 Dysphagia, oropharyngeal phase: Secondary | ICD-10-CM | POA: Diagnosis not present

## 2021-05-19 DIAGNOSIS — T17908S Unspecified foreign body in respiratory tract, part unspecified causing other injury, sequela: Secondary | ICD-10-CM | POA: Diagnosis not present

## 2021-05-19 DIAGNOSIS — M7989 Other specified soft tissue disorders: Secondary | ICD-10-CM

## 2021-05-19 DIAGNOSIS — M729 Fibroblastic disorder, unspecified: Secondary | ICD-10-CM | POA: Diagnosis present

## 2021-05-19 DIAGNOSIS — Z7982 Long term (current) use of aspirin: Secondary | ICD-10-CM

## 2021-05-19 DIAGNOSIS — E785 Hyperlipidemia, unspecified: Secondary | ICD-10-CM | POA: Diagnosis present

## 2021-05-19 DIAGNOSIS — L97519 Non-pressure chronic ulcer of other part of right foot with unspecified severity: Secondary | ICD-10-CM | POA: Diagnosis present

## 2021-05-19 DIAGNOSIS — I252 Old myocardial infarction: Secondary | ICD-10-CM

## 2021-05-19 DIAGNOSIS — T17908A Unspecified foreign body in respiratory tract, part unspecified causing other injury, initial encounter: Secondary | ICD-10-CM

## 2021-05-19 LAB — COMPREHENSIVE METABOLIC PANEL
ALT: 12 U/L (ref 0–44)
AST: 12 U/L — ABNORMAL LOW (ref 15–41)
Albumin: 3.1 g/dL — ABNORMAL LOW (ref 3.5–5.0)
Alkaline Phosphatase: 74 U/L (ref 38–126)
Anion gap: 10 (ref 5–15)
BUN: 18 mg/dL (ref 8–23)
CO2: 27 mmol/L (ref 22–32)
Calcium: 9.5 mg/dL (ref 8.9–10.3)
Chloride: 102 mmol/L (ref 98–111)
Creatinine, Ser: 0.8 mg/dL (ref 0.61–1.24)
GFR, Estimated: >60 mL/min (ref >60)
Glucose, Bld: 200 mg/dL — ABNORMAL HIGH (ref 70–99)
Potassium: 3.8 mmol/L (ref 3.5–5.1)
Sodium: 139 mmol/L (ref 135–145)
Total Bilirubin: 0.6 mg/dL (ref 0.3–1.2)
Total Protein: 7.3 g/dL (ref 6.5–8.1)

## 2021-05-19 LAB — CBC WITH DIFFERENTIAL/PLATELET
Abs Immature Granulocytes: 0.03 10*3/uL (ref 0.00–0.07)
Basophils Absolute: 0.1 10*3/uL (ref 0.0–0.1)
Basophils Relative: 1 %
Eosinophils Absolute: 0.2 10*3/uL (ref 0.0–0.5)
Eosinophils Relative: 2 %
HCT: 32.7 % — ABNORMAL LOW (ref 39.0–52.0)
Hemoglobin: 10.5 g/dL — ABNORMAL LOW (ref 13.0–17.0)
Immature Granulocytes: 0 %
Lymphocytes Relative: 14 %
Lymphs Abs: 1.3 10*3/uL (ref 0.7–4.0)
MCH: 28.5 pg (ref 26.0–34.0)
MCHC: 32.1 g/dL (ref 30.0–36.0)
MCV: 88.9 fL (ref 80.0–100.0)
Monocytes Absolute: 0.8 10*3/uL (ref 0.1–1.0)
Monocytes Relative: 9 %
Neutro Abs: 6.8 10*3/uL (ref 1.7–7.7)
Neutrophils Relative %: 74 %
Platelets: 323 10*3/uL (ref 150–400)
RBC: 3.68 MIL/uL — ABNORMAL LOW (ref 4.22–5.81)
RDW: 15.4 % (ref 11.5–15.5)
WBC: 9.1 10*3/uL (ref 4.0–10.5)
nRBC: 0 % (ref 0.0–0.2)

## 2021-05-19 LAB — BRAIN NATRIURETIC PEPTIDE: B Natriuretic Peptide: 71.8 pg/mL (ref 0.0–100.0)

## 2021-05-19 LAB — GLUCOSE, CAPILLARY
Glucose-Capillary: 108 mg/dL — ABNORMAL HIGH (ref 70–99)
Glucose-Capillary: 123 mg/dL — ABNORMAL HIGH (ref 70–99)

## 2021-05-19 LAB — PROTIME-INR
INR: 1.1 (ref 0.8–1.2)
Prothrombin Time: 14.5 seconds (ref 11.4–15.2)

## 2021-05-19 LAB — C-REACTIVE PROTEIN: CRP: 12.5 mg/dL — ABNORMAL HIGH (ref <1.0)

## 2021-05-19 LAB — URINALYSIS, COMPLETE (UACMP) WITH MICROSCOPIC
Bilirubin Urine: NEGATIVE
Glucose, UA: NEGATIVE mg/dL
Hgb urine dipstick: NEGATIVE
Ketones, ur: NEGATIVE mg/dL
Leukocytes,Ua: NEGATIVE
Nitrite: NEGATIVE
Protein, ur: NEGATIVE mg/dL
Specific Gravity, Urine: 1.017 (ref 1.005–1.030)
pH: 6 (ref 5.0–8.0)

## 2021-05-19 LAB — SEDIMENTATION RATE: Sed Rate: 117 mm/hr — ABNORMAL HIGH (ref 0–20)

## 2021-05-19 LAB — APTT: aPTT: 41 seconds — ABNORMAL HIGH (ref 24–36)

## 2021-05-19 LAB — CBG MONITORING, ED: Glucose-Capillary: 123 mg/dL — ABNORMAL HIGH (ref 70–99)

## 2021-05-19 LAB — LACTIC ACID, PLASMA: Lactic Acid, Venous: 1.2 mmol/L (ref 0.5–1.9)

## 2021-05-19 LAB — SARS CORONAVIRUS 2 (TAT 6-24 HRS): SARS Coronavirus 2: NEGATIVE

## 2021-05-19 MED ORDER — FENOFIBRATE 160 MG PO TABS
160.0000 mg | ORAL_TABLET | Freq: Every day | ORAL | Status: DC
  Administered 2021-05-20 – 2021-06-08 (×15): 160 mg via ORAL
  Filled 2021-05-19 (×21): qty 1

## 2021-05-19 MED ORDER — GADOBUTROL 1 MMOL/ML IV SOLN
6.0000 mL | Freq: Once | INTRAVENOUS | Status: AC | PRN
  Administered 2021-05-19: 6 mL via INTRAVENOUS
  Filled 2021-05-19: qty 6

## 2021-05-19 MED ORDER — HYDROCODONE-ACETAMINOPHEN 5-325 MG PO TABS
1.0000 | ORAL_TABLET | Freq: Four times a day (QID) | ORAL | Status: DC | PRN
  Administered 2021-05-28 – 2021-06-08 (×9): 1 via ORAL
  Filled 2021-05-19 (×9): qty 1

## 2021-05-19 MED ORDER — SODIUM CHLORIDE 0.9 % IV SOLN
2.0000 g | Freq: Once | INTRAVENOUS | Status: AC
  Administered 2021-05-19: 2 g via INTRAVENOUS
  Filled 2021-05-19: qty 2

## 2021-05-19 MED ORDER — NITROGLYCERIN 0.4 MG SL SUBL: 0.4000 mg | SUBLINGUAL_TABLET | SUBLINGUAL | Status: DC | PRN

## 2021-05-19 MED ORDER — ONDANSETRON HCL 4 MG/2ML IJ SOLN
4.0000 mg | Freq: Three times a day (TID) | INTRAMUSCULAR | Status: DC | PRN
Start: 2021-05-19 — End: 2021-06-08

## 2021-05-19 MED ORDER — ASPIRIN 81 MG PO CHEW
81.0000 mg | CHEWABLE_TABLET | Freq: Every day | ORAL | Status: DC
  Administered 2021-05-20 – 2021-06-08 (×15): 81 mg via ORAL
  Filled 2021-05-19 (×15): qty 1

## 2021-05-19 MED ORDER — SODIUM CHLORIDE 0.9% FLUSH
10.0000 mL | Freq: Two times a day (BID) | INTRAVENOUS | Status: DC
  Administered 2021-05-19 – 2021-06-06 (×30): 10 mL via INTRAVENOUS

## 2021-05-19 MED ORDER — MUPIROCIN CALCIUM 2 % EX CREA
TOPICAL_CREAM | Freq: Every day | CUTANEOUS | Status: DC
  Filled 2021-05-19 (×2): qty 15

## 2021-05-19 MED ORDER — HALOPERIDOL LACTATE 5 MG/ML IJ SOLN
1.0000 mg | Freq: Once | INTRAMUSCULAR | Status: AC
  Administered 2021-05-19: 1 mg via INTRAVENOUS
  Filled 2021-05-19: qty 1

## 2021-05-19 MED ORDER — ISOSORBIDE MONONITRATE ER 30 MG PO TB24
30.0000 mg | ORAL_TABLET | Freq: Every day | ORAL | Status: DC
  Administered 2021-05-20: 30 mg via ORAL
  Filled 2021-05-19: qty 1

## 2021-05-19 MED ORDER — ALBUTEROL SULFATE (2.5 MG/3ML) 0.083% IN NEBU
3.0000 mL | INHALATION_SOLUTION | RESPIRATORY_TRACT | Status: DC | PRN
  Administered 2021-05-21: 3 mL via RESPIRATORY_TRACT
  Filled 2021-05-19: qty 3

## 2021-05-19 MED ORDER — HEPARIN SODIUM (PORCINE) 5000 UNIT/ML IJ SOLN
5000.0000 [IU] | Freq: Three times a day (TID) | INTRAMUSCULAR | Status: DC
  Administered 2021-05-19 – 2021-05-23 (×11): 5000 [IU] via SUBCUTANEOUS
  Filled 2021-05-19 (×11): qty 1

## 2021-05-19 MED ORDER — PANTOPRAZOLE SODIUM 40 MG PO TBEC
40.0000 mg | DELAYED_RELEASE_TABLET | Freq: Every day | ORAL | Status: DC
  Administered 2021-05-20: 40 mg via ORAL
  Filled 2021-05-19: qty 1

## 2021-05-19 MED ORDER — SODIUM CHLORIDE 0.9 % IV BOLUS
1000.0000 mL | Freq: Once | INTRAVENOUS | Status: AC
  Administered 2021-05-19: 1000 mL via INTRAVENOUS

## 2021-05-19 MED ORDER — ACETAMINOPHEN 325 MG PO TABS
650.0000 mg | ORAL_TABLET | Freq: Four times a day (QID) | ORAL | Status: DC | PRN
  Administered 2021-05-20: 650 mg via ORAL
  Filled 2021-05-19 (×2): qty 2

## 2021-05-19 MED ORDER — INSULIN ASPART 100 UNIT/ML IJ SOLN
0.0000 [IU] | Freq: Three times a day (TID) | INTRAMUSCULAR | Status: DC
  Administered 2021-05-19: 1 [IU] via SUBCUTANEOUS
  Administered 2021-05-20: 2 [IU] via SUBCUTANEOUS
  Administered 2021-05-24 – 2021-05-25 (×3): 1 [IU] via SUBCUTANEOUS
  Administered 2021-05-25: 3 [IU] via SUBCUTANEOUS
  Administered 2021-05-26: 2 [IU] via SUBCUTANEOUS
  Administered 2021-05-26 – 2021-05-27 (×3): 1 [IU] via SUBCUTANEOUS
  Filled 2021-05-19 (×10): qty 1

## 2021-05-19 MED ORDER — PREGABALIN 75 MG PO CAPS
75.0000 mg | ORAL_CAPSULE | Freq: Two times a day (BID) | ORAL | Status: DC
  Administered 2021-05-19 – 2021-05-20 (×2): 75 mg via ORAL
  Filled 2021-05-19 (×4): qty 1

## 2021-05-19 MED ORDER — VANCOMYCIN HCL IN DEXTROSE 1-5 GM/200ML-% IV SOLN
1000.0000 mg | INTRAVENOUS | Status: DC
  Administered 2021-05-20: 1000 mg via INTRAVENOUS
  Filled 2021-05-19 (×3): qty 200

## 2021-05-19 MED ORDER — VANCOMYCIN HCL 1250 MG/250ML IV SOLN
1250.0000 mg | Freq: Once | INTRAVENOUS | Status: AC
  Administered 2021-05-19: 1250 mg via INTRAVENOUS
  Filled 2021-05-19: qty 250

## 2021-05-19 MED ORDER — LORAZEPAM 2 MG/ML IJ SOLN
0.5000 mg | INTRAMUSCULAR | Status: DC | PRN
  Administered 2021-05-20 – 2021-05-21 (×3): 0.5 mg via INTRAVENOUS
  Filled 2021-05-19 (×3): qty 1

## 2021-05-19 MED ORDER — INSULIN ASPART 100 UNIT/ML IJ SOLN: 0.0000 [IU] | Freq: Every day | INTRAMUSCULAR | Status: DC

## 2021-05-19 NOTE — Consult Note (Signed)
Reason for Consult:PAD with left foot cellulitis and mal perforans ulceration Referring Physician: Dr. Roxan Hockey, MD ER  Asaiah Scarber Kinnear is an 87 y.o. male.  HPI: This is a gentleman with history of right heel ulceration that had healed well and has developed a mal perforans ulceration in the right foot.  He was brought to the ER due to right foot cellulitis and swelling. He has a history of EVAR done at [facility]. He is followed by vascular surgery at [facility] and was told that his arterial flow was good.   Past Medical History:  Diagnosis Date   Anginal pain (HCC)    Arthritis    Complication of anesthesia    spinal anesthesia caused nerve damage in 2002   Coronary artery disease    Diabetes mellitus without complication (HCC)    md stopped metformin   Diabetic ulcer of left foot (HCC)    GERD (gastroesophageal reflux disease)    Hyperlipemia    Multiple thyroid nodules    Myocardial infarction [facility])    STEMI   Osteoporosis    Peripheral nerve disease    Ulceration of colon    NSAIDS   Vitamin D deficiency     Past Surgical History:  Procedure Laterality Date   ABDOMINAL AORTIC ANEURYSM REPAIR  2013   ACHILLES TENDON SURGERY Left 09/28/2018   Procedure: ACHILLES TENDON MICROTENOTOMY-TAL;  Surgeon: Gwyneth Revels, DPM;  Location: [facility] ORS;  Service: Podiatry;  Laterality: Left;   CARPAL TUNNEL RELEASE Right    excision of benign skin lesion     JOINT REPLACEMENT     right knee   SKIN LESION EXCISION     thyroid lobectomy Right    WOUND DEBRIDEMENT Left 09/28/2018   Procedure: DEBRIDEMENT WOUND-SKIN,SUBCUTANEOUS TISSUE;  Surgeon: Gwyneth Revels, DPM;  Location: [facility] ORS;  Service: Podiatry;  Laterality: Left;    No family history on file.  Social History:  reports that he quit smoking about 29 years ago. His smoking use included cigarettes. He has never used smokeless tobacco. He reports previous alcohol use. He reports that he does not use drugs.  Allergies:  Allergies   Allergen Reactions   Penicillins Other (See Comments)    unknown   Sulfa Antibiotics Other (See Comments)    unknown    Medications: I have reviewed the patient's current medications.  Results for orders placed or performed during the hospital encounter of 05/19/21 (from the past 48 hour(s))  Protime-INR     Status: None   Collection Time: 05/19/21  7:25 AM  Result Value Ref Range   Prothrombin Time 14.5 11.4 - 15.2 seconds   INR 1.1 0.8 - 1.2    Comment: (NOTE) INR goal varies based on device and disease states. Performed at [facility], [address] [place], [place] [address]   APTT     Status: Abnormal   Collection Time: 05/19/21  7:25 AM  Result Value Ref Range   aPTT 41 (H) 24 - 36 seconds    Comment:        IF BASELINE aPTT IS ELEVATED, SUGGEST PATIENT RISK ASSESSMENT BE USED TO DETERMINE APPROPRIATE ANTICOAGULANT THERAPY. Performed at [facility], [address] [place], [place] [address]   Sedimentation rate     Status: Abnormal   Collection Time: 05/19/21  7:25 AM  Result Value Ref Range   Sed Rate 117 (H) 0 - 20 mm/hr    Comment: Performed at [facility], [address] [place],  [place] [address]  Lactic acid, plasma     Status: None   Collection Time: 05/19/21  7:28 AM  Result Value Ref Range   Lactic Acid, Venous 1.2 0.5 - 1.9 mmol/L    Comment: Performed at [facility], [address] [place], [place] [address]  Comprehensive metabolic panel     Status: Abnormal   Collection Time: 05/19/21  7:28 AM  Result Value Ref Range   Sodium 139 135 - 145 mmol/L   Potassium 3.8 3.5 - 5.1 mmol/L   Chloride 102 98 - 111 mmol/L   CO2 27 22 - 32 mmol/L   Glucose, Bld 200 (H) 70 - 99 mg/dL    Comment: Glucose reference range applies only to samples taken after fasting for at least 8 hours.   BUN 18 8 - 23 mg/dL   Creatinine, Ser 1.69 0.61 - 1.24 mg/dL   Calcium 9.5 8.9 - 45.0 mg/dL   Total Protein 7.3 6.5 - 8.1 g/dL    Albumin 3.1 (L) 3.5 - 5.0 g/dL   AST 12 (L) 15 - 41 U/L   ALT 12 0 - 44 U/L   Alkaline Phosphatase 74 38 - 126 U/L   Total Bilirubin 0.6 0.3 - 1.2 mg/dL   GFR, Estimated >38 >88 mL/min    Comment: (NOTE) Calculated using the CKD-EPI Creatinine Equation (2021)    Anion gap 10 5 - 15    Comment: Performed at [facility], [address] [place], [place] [address]  CBC with Differential     Status: Abnormal   Collection Time: 05/19/21  7:28 AM  Result Value Ref Range   WBC 9.1 4.0 - 10.5 K/uL   RBC 3.68 (L) 4.22 - 5.81 MIL/uL   Hemoglobin 10.5 (L) 13.0 - 17.0 g/dL   HCT 49.1 (L) 79.1 - 50.5 %   MCV 88.9 80.0 - 100.0 fL   MCH 28.5 26.0 - 34.0 pg   MCHC 32.1 30.0 - 36.0 g/dL   RDW 69.7 94.8 - 01.6 %   Platelets 323 150 - 400 K/uL   nRBC 0.0 0.0 - 0.2 %   Neutrophils Relative % 74 %   Neutro Abs 6.8 1.7 - 7.7 K/uL   Lymphocytes Relative 14 %   Lymphs Abs 1.3 0.7 - 4.0 K/uL   Monocytes Relative 9 %   Monocytes Absolute 0.8 0.1 - 1.0 K/uL   Eosinophils Relative 2 %   Eosinophils Absolute 0.2 0.0 - 0.5 K/uL   Basophils Relative 1 %   Basophils Absolute 0.1 0.0 - 0.1 K/uL   Immature Granulocytes 0 %   Abs Immature Granulocytes 0.03 0.00 - 0.07 K/uL    Comment: Performed at [facility], [address] [place], [place] [address]    No results found.  Review of Systems  Constitutional: Negative.   HENT: Negative.    Eyes: Negative.   Respiratory: Negative.    Endocrine: Negative.   Genitourinary: Negative.   Allergic/Immunologic: Negative.   Neurological: Negative.   All other systems reviewed and are negative. Blood pressure 129/71, pulse (!) 112, temperature 98.9 F (37.2 C), temperature source Oral, resp. rate 18, height 5\' 9"  (1.753 m), weight 59 kg, SpO2 100 %. Physical Exam Vitals and nursing note reviewed.  Constitutional:      Appearance: Normal appearance. He is normal weight.  HENT:     Head: Normocephalic and atraumatic.  Eyes:      Extraocular Movements: Extraocular movements intact.     Conjunctiva/sclera:  Conjunctivae normal.     Pupils: Pupils are equal, round, and reactive to light.  Cardiovascular:     Rate and Rhythm: Normal rate and regular rhythm.     Pulses:          Carotid pulses are 1+ on the right side and 1+ on the left side.      Radial pulses are 1+ on the right side and 1+ on the left side.       Femoral pulses are 1+ on the right side and 1+ on the left side.      Popliteal pulses are 1+ on the right side and 2+ on the left side.       Dorsalis pedis pulses are 1+ on the right side and 1+ on the left side.       Posterior tibial pulses are 1+ on the right side and 1+ on the left side.     Heart sounds: Normal heart sounds, S1 normal and S2 normal.  Pulmonary:     Effort: Pulmonary effort is normal.     Breath sounds: Normal breath sounds.  Abdominal:     General: Abdomen is flat. Bowel sounds are normal.  Musculoskeletal:        General: Normal range of motion.     Cervical back: Normal range of motion and neck supple.     Right lower leg: 2+ Pitting Edema present.       Feet:  Feet:     Right foot:     Skin integrity: Ulcer and warmth present.     Toenail Condition: Right toenails are abnormally thick.     Left foot:     Skin integrity: Ulcer present.     Toenail Condition: Left toenails are abnormally thick.  Skin:    General: Skin is warm and dry.     Capillary Refill: Capillary refill takes less than 2 seconds.  Neurological:     General: No focal deficit present.     Mental Status: He is alert and oriented to person, place, and time. Mental status is at baseline.  Psychiatric:        Mood and Affect: Mood normal.        Behavior: Behavior normal.    Assessment/Plan: This is a 87 year old male with right foot mal perforans ulceration, cellulitis, and right limb swelling, left heel small ulcerations.  Possible right foot osteomyelitis. Prominent left popliteal pulse. Good arterial  flow to both feet. Recommend: MRI of the right foot.  Follow-up with Vascular Surgery outpatient clinic for evaluation of possible popliteal artery aneurysm   Festus Barren 05/19/2021, 10:33 AM

## 2021-05-19 NOTE — Consult Note (Signed)
Pharmacy Antibiotic Note  Rodney Kelly is a 87 y.o. male w/ h/o HLD, CAD, STEMI, DM, ,admitted on 05/19/2021 with  Diabetic Foot Infection .  Pharmacy has been consulted for Vancomycin dosing.  Plan: Pt received Vanc 1.25g loading dose x1 in ED (6/15); Plan vanc 1g q24h starting (6/16>> - Predicted AUC 541, Cmax 36, Cmin 13.4  - calculated on Scr 0.8 (at BL 0.8), Vd 0.72, TBW<IBW  Also received aztreonam x1 in ED.  Height: 5\' 9"  (175.3 cm) Weight: 59 kg (130 lb) IBW/kg (Calculated) : 70.7  Temp (24hrs), Avg:98.9 F (37.2 C), Min:98.9 F (37.2 C), Max:98.9 F (37.2 C)  Recent Labs  Lab 05/19/21 0728  WBC 9.1  CREATININE 0.80  LATICACIDVEN 1.2    Estimated Creatinine Clearance: 47.1 mL/min (by C-G formula based on SCr of 0.8 mg/dL).    Allergies  Allergen Reactions   Penicillins Other (See Comments)    unknown   Sulfa Antibiotics Other (See Comments)    unknown    Antimicrobials this admission: Vanc (6/15 >>  AZT (6/15)  Dose adjustments this admission: CTM renal fxn; adust prn  Microbiology results: 6/15 BCx: sent/pending 6/15 Covid swab: sent/pending 6/15 MRSA PCR: ordered  Thank you for allowing pharmacy to be a part of this patient's care.  7/15 Rodney Kelly 05/19/2021 10:16 AM

## 2021-05-19 NOTE — ED Triage Notes (Signed)
Per pt son, pt has chronic wounds on BL feet, worse on the right Dr. Ether Griffins is concerned he may need to do surgery. Pt has dementia and is a poor historian.

## 2021-05-19 NOTE — Consult Note (Signed)
PODIATRY / FOOT AND ANKLE SURGERY CONSULTATION NOTE  Requesting Physician: Dr. Clyde LundborgNiu  Reason for consult: Foot wounds, infection right foot  Chief Complaint: Right foot wound infection   HPI: Rodney BoringCharles Lindberg Kelly is a 87 y.o. male who presents with nonhealing ulcerations to the plantar aspect the left heel and right first metatarsal phalangeal joint plantar.  Patient was seen in clinic by Dr. Ether GriffinsFowler a few days ago and was noted to have purulent drainage coming from the right first metatarsal phalangeal joint ulceration with associated erythema and edema compared to the contralateral limb.  Patient has had a history of chronic ulcerations to these areas.  Patient was urged to go to the emergency room after that visit for IV antibiotics and MRI imaging.  Patient [facility] [place] but after being in the live very long wait decided to go to [facility].  Patient presents today for further evaluation and treatment.  Patient does have a history of peripheral vascular disease as well as diabetes.  PMHx:  Past Medical History:  Diagnosis Date   Anginal pain (HCC)    Arthritis    Complication of anesthesia    spinal anesthesia caused nerve damage in 2002   Coronary artery disease    Diabetes mellitus without complication (HCC)    md stopped metformin   Diabetic ulcer of left foot (HCC)    GERD (gastroesophageal reflux disease)    Hyperlipemia    Multiple thyroid nodules    Myocardial infarction (HCC)    STEMI   Osteoporosis    Peripheral nerve disease    Ulceration of colon    NSAIDS   Vitamin D deficiency     Surgical Hx:  Past Surgical History:  Procedure Laterality Date   ABDOMINAL AORTIC ANEURYSM REPAIR  2013   ACHILLES TENDON SURGERY Left 09/28/2018   Procedure: ACHILLES TENDON MICROTENOTOMY-TAL;  Surgeon: Gwyneth RevelsFowler, Justin, DPM;  Location: [facility] ORS;  Service: Podiatry;  Laterality: Left;   CARPAL TUNNEL RELEASE Right    excision of benign skin  lesion     JOINT REPLACEMENT     right knee   SKIN LESION EXCISION     thyroid lobectomy Right    WOUND DEBRIDEMENT Left 09/28/2018   Procedure: DEBRIDEMENT WOUND-SKIN,SUBCUTANEOUS TISSUE;  Surgeon: Gwyneth RevelsFowler, Justin, DPM;  Location: [facility] ORS;  Service: Podiatry;  Laterality: Left;    FHx: No family history on file.  Social History:  reports that he quit smoking about 29 years ago. His smoking use included cigarettes. He has never used smokeless tobacco. He reports previous alcohol use. He reports that he does not use drugs.  Allergies:  Allergies  Allergen Reactions   Penicillins Other (See Comments)    unknown   Sulfa Antibiotics Other (See Comments)    unknown    (Not in a hospital admission)   Physical Exam: General: Alert and oriented.  No apparent distress.  Vascular: DP/PT pulses +1 bilateral, capillary fill time appears to be intact to digits bilateral.  Both feet appear to be warm to touch right greater than left.  No hair growth noted to digits to bilateral lower extremities.  Moderate edema and erythema present to the right forefoot that extends to the midfoot.  Neuro: Light touch sensation absent to bilateral lower extremities.  Derm: Ulceration present to the plantar aspect of the left heel.  Appears to be stable but does probe to the deep subcutaneous tissue, no bone exposed.  No associated erythema or edema, minimal  serous drainage.  Measures approximately 0.1 x 0.1 x 0.4 cm  Right plantar first metatarsal phalangeal joint ulceration measures approximately 0.3 x 0.2 x 0.6 cm and probes deep very close to the sesamoids and first metatarsal phalangeal joint to hard stop concerning for depth of wound to bone.  Associated purulent drainage as well as erythema and edema that extends from the ulceration to the midfoot.  MSK: 4/5 strength bilateral lower extremities.  Results for orders placed or performed during the hospital encounter of 05/19/21 (from the past 48  hour(s))  Protime-INR     Status: None   Collection Time: 05/19/21  7:25 AM  Result Value Ref Range   Prothrombin Time 14.5 11.4 - 15.2 seconds   INR 1.1 0.8 - 1.2    Comment: (NOTE) INR goal varies based on device and disease states. Performed at [facility], [address] [place], [place] [address]   APTT     Status: Abnormal   Collection Time: 05/19/21  7:25 AM  Result Value Ref Range   aPTT 41 (H) 24 - 36 seconds    Comment:        IF BASELINE aPTT IS ELEVATED, SUGGEST PATIENT RISK ASSESSMENT BE USED TO DETERMINE APPROPRIATE ANTICOAGULANT THERAPY. Performed at [facility], [address] [place], [place] [address]   Sedimentation rate     Status: Abnormal   Collection Time: 05/19/21  7:25 AM  Result Value Ref Range   Sed Rate 117 (H) 0 - 20 mm/hr    Comment: Performed at [facility], [address] [place], [place] [address]  C-reactive protein     Status: Abnormal   Collection Time: 05/19/21  7:25 AM  Result Value Ref Range   CRP 12.5 (H) <1.0 mg/dL    Comment: Performed at [facility], [address] [place], [place] [address]  Brain natriuretic peptide     Status: None   Collection Time: 05/19/21  7:25 AM  Result Value Ref Range   B Natriuretic Peptide 71.8 0.0 - 100.0 pg/mL    Comment: Performed at [facility], [address] [place], [place] [address]  Lactic acid, plasma     Status: None   Collection Time: 05/19/21  7:28 AM  Result Value Ref Range   Lactic Acid, Venous 1.2 0.5 - 1.9 mmol/L    Comment: Performed at [facility], [address] [place], [place] [address]  Comprehensive metabolic panel     Status: Abnormal   Collection Time: 05/19/21  7:28 AM  Result Value Ref Range   Sodium 139 135 - 145 mmol/L   Potassium 3.8 3.5 - 5.1 mmol/L   Chloride 102 98 - 111 mmol/L   CO2 27 22 - 32 mmol/L   Glucose, Bld 200 (H) 70 - 99 mg/dL    Comment: Glucose reference range applies only to samples  taken after fasting for at least 8 hours.   BUN 18 8 - 23 mg/dL   Creatinine, Ser 3.01 0.61 - 1.24 mg/dL   Calcium 9.5 8.9 - 60.1 mg/dL   Total Protein 7.3 6.5 - 8.1 g/dL   Albumin 3.1 (L) 3.5 - 5.0 g/dL   AST 12 (L) 15 - 41 U/L   ALT 12 0 - 44 U/L   Alkaline Phosphatase 74 38 - 126 U/L   Total Bilirubin 0.6 0.3 - 1.2 mg/dL   GFR, Estimated >09 >32 mL/min    Comment: (NOTE) Calculated using the CKD-EPI Creatinine  Equation (2021)    Anion gap 10 5 - 15    Comment: Performed at [facility], [address] [place], [place] [address]  CBC with Differential     Status: Abnormal   Collection Time: 05/19/21  7:28 AM  Result Value Ref Range   WBC 9.1 4.0 - 10.5 K/uL   RBC 3.68 (L) 4.22 - 5.81 MIL/uL   Hemoglobin 10.5 (L) 13.0 - 17.0 g/dL   HCT 87.6 (L) 81.1 - 57.2 %   MCV 88.9 80.0 - 100.0 fL   MCH 28.5 26.0 - 34.0 pg   MCHC 32.1 30.0 - 36.0 g/dL   RDW 62.0 35.5 - 97.4 %   Platelets 323 150 - 400 K/uL   nRBC 0.0 0.0 - 0.2 %   Neutrophils Relative % 74 %   Neutro Abs 6.8 1.7 - 7.7 K/uL   Lymphocytes Relative 14 %   Lymphs Abs 1.3 0.7 - 4.0 K/uL   Monocytes Relative 9 %   Monocytes Absolute 0.8 0.1 - 1.0 K/uL   Eosinophils Relative 2 %   Eosinophils Absolute 0.2 0.0 - 0.5 K/uL   Basophils Relative 1 %   Basophils Absolute 0.1 0.0 - 0.1 K/uL   Immature Granulocytes 0 %   Abs Immature Granulocytes 0.03 0.00 - 0.07 K/uL    Comment: Performed at [facility], [address] [place], [place] [address]   US Venous Img Lower Unilateral Right (DVT)  Result Date: 05/19/2021 CLINICAL DATA:  Lower extremity edema EXAM: RIGHT LOWER EXTREMITY VENOUS DUPLEX ULTRASOUND TECHNIQUE: Gray-scale sonography with graded compression, as well as color Doppler and duplex ultrasound were performed to evaluate the right lower extremity deep venous system from the level of the common femoral vein and including the common femoral, femoral, profunda femoral, popliteal and calf veins  including the posterior tibial, peroneal and gastrocnemius veins when visible. The superficial great saphenous vein was also interrogated. Spectral Doppler was utilized to evaluate flow at rest and with distal augmentation maneuvers in the common femoral, femoral and popliteal veins. COMPARISON:  None. FINDINGS: Contralateral Common Femoral Vein: Respiratory phasicity is normal and symmetric with the symptomatic side. No evidence of thrombus. Normal compressibility. Common Femoral Vein: No evidence of thrombus. Normal compressibility, respiratory phasicity and response to augmentation. Saphenofemoral Junction: No evidence of thrombus. Normal compressibility and flow on color Doppler imaging. Profunda Femoral Vein: No evidence of thrombus. Normal compressibility and flow on color Doppler imaging. Femoral Vein: No evidence of thrombus. Normal compressibility, respiratory phasicity and response to augmentation. Popliteal Vein: No evidence of thrombus. Normal compressibility, respiratory phasicity and response to augmentation. Calf Veins: No evidence of thrombus. Normal compressibility and flow on color Doppler imaging. Superficial Great Saphenous Vein: No evidence of thrombus. Normal compressibility. Venous Reflux:  None. Other Findings:  None. IMPRESSION: No evidence of deep venous thrombosis in the right lower extremity. Left common femoral vein also patent. Electronically Signed   By: Bretta Bang III M.D.   On: 05/19/2021 12:09    Blood pressure (!) 151/96, pulse 82, temperature 98.9 F (37.2 C), temperature source Oral, resp. rate 20, height 5\' 9"  (1.753 m), weight 59 kg, SpO2 98 %.   Assessment Right foot likely osteomyelitis first metatarsal phalangeal joint with associated open wound and cellulitis with purulent drainage/abscess Diabetes type 2 polyneuropathy PVD Left plantar heel wound, stable  Plan -Patient seen and examined. -Left plantar heel wound appears to be stable with no signs of  infection present. -Right plantar first metatarsal  phalangeal joint wound appears to probe deep to capsule and bone of the first metatarsal phalangeal joint concerning for potential osteomyelitis, associated erythema and edema as well as purulent drainage. -Wound culture taken today and sent off. -Applied Betadine wet-to-dry dressings to both feet today. -MRI pending -Discussed potential treatment options including incision and drainage versus potential even a partial first ray amputation depending on MRI present but will discuss this tomorrow. -Appreciate medicine recommendations for IV antibiotic therapy. -Appreciate vascular recommendations.  They believe he likely has enough flow to heal any procedure at this time and recommend outpatient follow-up. -Patient ambulate in surgical shoes to both feet. -We will follow-up tomorrow to discuss MRI imaging.  Likely planning for surgery at some point on Friday if needed with Dr. Ether Griffins or myself.  Rosetta Posner, DPM 05/19/2021, 12:25 PM

## 2021-05-19 NOTE — Progress Notes (Signed)
PHARMACY -  BRIEF ANTIBIOTIC NOTE   Pharmacy has received consult(s) for Vancomycin and aztreonam from an ED provider.  The patient's profile has been reviewed for ht/wt/allergies/indication/available labs.    One time order(s) placed for Vancomycin 1250 mg IV and Aztreonam 2 gm  Further antibiotics/pharmacy consults should be ordered by admitting physician if indicated.                       Thank you, Irbin Fines A 05/19/2021  8:46 AM

## 2021-05-19 NOTE — Consult Note (Addendum)
WOC consult requested to provide topical treatment recommendations for bilat foot wounds.   Consult performed remotely after review of photos and progress notes in the EMR. Pt has been followed in the past by podiatry and was just assessed this am by the vascular team.  MRI is pending to r/o osteomyelitis.  Left plantar heel with full thickness wound in 2 areas; yellow and surrounded by dry callous  Right plantar foot with full thickness wound; yellow and surrounded by dry callous. Topical treatment orders provided for bedside nurses to perform to provide antimicrobial benefits and promote moist healing as follows: Apply Bactroban to left heel and right foot wounds Q day, then cover with 2X2 and foam dressing.  (Change foam dressings Q 3 days or PRN soiling.) Please re-consult if further assistance is needed.  Thank-you,  Cammie Mcgee MSN, RN, CWOCN, [place], CNS [phone]

## 2021-05-19 NOTE — H&P (Signed)
History and Physical    Rodney Kelly ZMO:[id number] DOB: Jul 20, 1924 DOA: 05/19/2021  Referring MD/NP/PA:   PCP: Rodney Drum, MD   Patient coming from:  The patient is coming from home.     Chief Complaint: foot ulcer with infection  HPI: Rodney Kelly is a 87 y.o. male with medical history significant of DM, HLD, PCD, GERD, CAD, MI, diabetic foot ulcer, dementia who presents with foot ulcer with infection.  Patient has chronic bilateral foot ulcers.  Patient has been followed up with Dr. Vickki Muff of podiatry. His left foot ulcer seems to have been in healing process.  Patient was treated with 1 course of doxycycline as outpatient without improvement in right foot infection. Wound culture of right foot was done and grew normal skin flora per Dr. Alvera Singh clinic note. Patient has swelling and erythema to right foot and right lower leg. Dr. Vickki Muff recommended hospitalization for antibiotics and likely MRI of his right foot.  Patient may need debridement depending on MRI findings.  Per his son, patient has pain in right foot, but cannot provide detailed information due to dementia.  Patient does not have fever, chills.  No chest pain, cough, shortness breath.  No nausea vomiting, diarrhea or abdominal pain.  ED Course: pt was found to have WBC 9.1, lactic acid 1.2, pending COVID-19 PCR, electrolytes renal function okay, temperature normal, blood pressure 108/57, heart rate 112, RR 18, oxygen saturation 100% on room air.  Patient is admitted to [place] bed as inpatient.  Dr. Luana Shu of podiatry and Dr. Lucky Cowboy of [facility] are consulted   Review of Systems: Could not be reviewed accurately due to dementia.   Allergy:  Allergies  Allergen Reactions   Penicillins Other (See Comments)    unknown   Sulfa Antibiotics Other (See Comments)    unknown    Past Medical History:  Diagnosis Date   Anginal pain ([place])    Arthritis    Complication of anesthesia    spinal anesthesia  caused nerve damage in 2002   Coronary artery disease    Diabetes mellitus without complication ([place])    md stopped metformin   Diabetic ulcer of left foot (HCC)    GERD (gastroesophageal reflux disease)    Hyperlipemia    Multiple thyroid nodules    Myocardial infarction ([place])    STEMI   Osteoporosis    Peripheral nerve disease    Ulceration of colon    NSAIDS   Vitamin D deficiency     Past Surgical History:  Procedure Laterality Date   ABDOMINAL AORTIC ANEURYSM REPAIR  2013   ACHILLES TENDON SURGERY Left 09/28/2018   Procedure: ACHILLES TENDON MICROTENOTOMY-TAL;  Surgeon: Samara Deist, DPM;  Location: [facility] ORS;  Service: Podiatry;  Laterality: Left;   CARPAL TUNNEL RELEASE Right    excision of benign skin lesion     JOINT REPLACEMENT     right knee   SKIN LESION EXCISION     thyroid lobectomy Right    WOUND DEBRIDEMENT Left 09/28/2018   Procedure: DEBRIDEMENT WOUND-SKIN,SUBCUTANEOUS TISSUE;  Surgeon: Samara Deist, DPM;  Location: [facility] ORS;  Service: Podiatry;  Laterality: Left;    Social History:  reports that he quit smoking about 29 years ago. His smoking use included cigarettes. He has never used smokeless tobacco. He reports previous alcohol use. He reports that he does not use drugs.  Family History: Could not be reviewed accurately due to dementia.  Prior to Admission medications   Medication Sig  Start Date End Date Taking? Authorizing Provider  acetaminophen (TYLENOL) 500 MG tablet Take 1,000 mg by mouth every 8 (eight) hours as needed for moderate pain.   Yes [provider]  aspirin 81 MG chewable tablet Chew 81 mg by mouth daily.   Yes [provider]  fenofibrate (TRICOR) 145 MG tablet Take 145 mg by mouth every evening.   Yes [provider]  fenofibrate (TRICOR) 145 MG tablet  10/09/14  Yes [provider]  isosorbide mononitrate (IMDUR) 30 MG 24 hr tablet Take by mouth. 07/22/20  Yes [provider]  metFORMIN  (GLUCOPHAGE) 1000 MG tablet Take 1,000 mg by mouth 2 (two) times daily with a meal.   Yes [provider]  omeprazole (PRILOSEC) 40 MG capsule Take 40 mg by mouth daily.   Yes [provider]  pregabalin (LYRICA) 75 MG capsule Take 75 mg by mouth 2 (two) times daily.    Yes [provider]  albuterol (PROVENTIL HFA;VENTOLIN HFA) 108 (90 Base) MCG/ACT inhaler Inhale 2 puffs into the lungs every 6 (six) hours as needed for wheezing or shortness of breath. Patient not taking: No sig reported    [provider]  alendronate (FOSAMAX) 70 MG tablet Take 70 mg by mouth once a week. Take with a full glass of water on an empty stomach. Patient not taking: No sig reported    [provider]  atorvastatin (LIPITOR) 20 MG tablet Take 20 mg by mouth daily. Patient not taking: No sig reported    [provider]  HYDROcodone-acetaminophen (NORCO) 5-325 MG tablet Take 1 tablet by mouth every 6 (six) hours as needed for moderate pain. Patient not taking: No sig reported 09/28/18   Samara Deist, DPM  nitroGLYCERIN (NITROSTAT) 0.4 MG SL tablet Place 0.4 mg under the tongue every 5 (five) minutes as needed for chest pain.    [provider]  sertraline (ZOLOFT) 50 MG tablet Take 50 mg by mouth daily. Patient not taking: No sig reported    [provider]    Physical Exam: Vitals:   05/19/21 0721 05/19/21 0722 05/19/21 0900  BP: (!) 108/57  129/71  Pulse: (!) 112    Resp: 18    Temp: 98.9 F (37.2 C)    TempSrc: Oral    SpO2: 100%    Weight:  59 kg   Height:  5' 9"  (1.753 m)    General: Not in acute distress HEENT:       Eyes: PERRL, EOMI, no scleral icterus.       ENT: No discharge from the ears and nose, no pharynx injection, no tonsillar enlargement.        Neck: No JVD, no bruit, no mass felt. Heme: No neck lymph node enlargement. Cardiac: S1/S2, RRR, No murmurs, No gallops or rubs. Respiratory: No rales, wheezing, rhonchi  or rubs. GI: Soft, nondistended, nontender, no rebound pain, no organomegaly, BS present. GU: No hematuria Ext: Has a small ulcer in left foot heel without active drainage, no surrounding erythema.  Has an ulcer in the plantar area of right foot, with active pus draining.  Has erythema, swelling and tenderness in the right foot and right lower leg.  No swelling in left lower leg.     Musculoskeletal: No joint deformities, No joint redness or warmth, no limitation of ROM in spin. Skin: No rashes.  Neuro: Alert, cranial nerves II-XII grossly intact, moves all extremities normally.  Psych: Patient is not psychotic, no suicidal or  hemocidal ideation.  Labs on Admission: I have personally reviewed following labs and imaging studies  CBC: Recent Labs  Lab 05/19/21 0728  WBC 9.1  NEUTROABS 6.8  HGB 10.5*  HCT 32.7*  MCV 88.9  PLT 762   Basic Metabolic Panel: Recent Labs  Lab 05/19/21 0728  NA 139  K 3.8  CL 102  CO2 27  GLUCOSE 200*  BUN 18  CREATININE 0.80  CALCIUM 9.5   GFR: Estimated Creatinine Clearance: 47.1 mL/min (by C-G formula based on SCr of 0.8 mg/dL). Liver Function Tests: Recent Labs  Lab 05/19/21 0728  AST 12*  ALT 12  ALKPHOS 74  BILITOT 0.6  PROT 7.3  ALBUMIN 3.1*   No results for input(s): LIPASE, AMYLASE in the last 168 hours. No results for input(s): AMMONIA in the last 168 hours. Coagulation Profile: No results for input(s): INR, PROTIME in the last 168 hours. Cardiac Enzymes: No results for input(s): CKTOTAL, CKMB, CKMBINDEX, TROPONINI in the last 168 hours. BNP (last 3 results) No results for input(s): PROBNP in the last 8760 hours. HbA1C: No results for input(s): HGBA1C in the last 72 hours. CBG: No results for input(s): GLUCAP in the last 168 hours. Lipid Profile: No results for input(s): CHOL, HDL, LDLCALC, TRIG, CHOLHDL, LDLDIRECT in the last 72 hours. Thyroid Function Tests: No results for input(s): TSH, T4TOTAL, FREET4, T3FREE,  THYROIDAB in the last 72 hours. Anemia Panel: No results for input(s): VITAMINB12, FOLATE, FERRITIN, TIBC, IRON, RETICCTPCT in the last 72 hours. Urine analysis: No results found for: COLORURINE, APPEARANCEUR, LABSPEC, PHURINE, GLUCOSEU, HGBUR, BILIRUBINUR, KETONESUR, PROTEINUR, UROBILINOGEN, NITRITE, LEUKOCYTESUR Sepsis Labs: @LABRCNTIP (procalcitonin:4,lacticidven:4) )No results found for this or any previous visit (from the past 240 hour(s)).   Radiological Exams on Admission: No results found.   EKG: I have personally reviewed.  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  Assessment/Plan Principal Problem:   Diabetic foot infection ([place]) Active Problems:   Coronary artery disease   Type 2 diabetes mellitus with complication, without long-term current use of insulin (HCC)   Diabetic foot ulcers (HCC)   Hyperlipemia   GERD (gastroesophageal reflux disease)   Normocytic anemia   Cellulitis of right lower extremity   Diabetic foot ulcer infection and cellulitis of right lower extremity: Patient failed outpatient oral antibiotic doxycycline treatment.  Patient has tachycardia, but no fever, tachypnea or leukocytosis.  Clinically not septic.  Lactic acid normal 1.2. Dr. Luana Shu of podiatry and Dr. Lucky Cowboy of [facility] are consulted.   - Admitted to MedSurg bed as inpatient - Empiric antimicrobial treatment with vancomycin (patient received 1 dose of aztreonam in the ED) - PRN Zofran for nausea, tylenol and Norco for pain - Blood cultures x 2  - ESR and CRP - wound care consult - MRI-right foot  - IVF: 1.0 L of NS bolus  - INR/PTT - LE doppler of right leg to r/o DVT  Coronary artery disease: No CP. S/p of stent per his son -ASA, tricor, Imdur -prn NGT  Type 2 diabetes mellitus with complication of foot ulcer, without long-term current use of insulin ([place]): Recent A1c 6.4, well controlled.  Patient taking metformin at home -Sliding scale  insulin  Hyperlipemia -Fenofibrate -Patient is not taking Lipitor currently  GERD (gastroesophageal reflux disease) -Protonix  Normocytic anemia: Hemoglobin 10.5 (11.4 on 10/22/2020), slightly dropped. -Follow-up with CBC     DVT ppx: SQ Heparin   Code Status: Full code per his son Family Communication:  Yes, patient's   son at bed side Disposition Plan:  Anticipate discharge back to previous environment Consults called:  Dr. Luana Shu of podiatry and Dr. Lucky Cowboy of [facility] are consulted Admission status and Level of care: Med-Surg:     as inpt     Status is: Inpatient  Remains inpatient appropriate because:Inpatient level of care appropriate due to severity of illness  Dispo: The patient is from: Home              Anticipated d/c is to: Home              Patient currently is not medically stable to d/c.   Difficult to place patient No           Date of Service 05/19/2021    [facility]   If 7PM-7AM, please contact night-coverage [email or web address] 05/19/2021, 10:26 AM

## 2021-05-19 NOTE — Plan of Care (Signed)
  Problem: Education: Goal: Knowledge of General Education information will improve Description: Including pain rating scale, medication(s)/side effects and non-pharmacologic comfort measures Outcome: Progressing   Problem: Health Behavior/Discharge Planning: Goal: Ability to manage health-related needs will improve Outcome: Progressing   Problem: Clinical Measurements: Goal: Ability to maintain clinical measurements within normal limits will improve Outcome: Progressing Goal: Will remain free from infection Outcome: Progressing Goal: Diagnostic test results will improve Outcome: Progressing Goal: Respiratory complications will improve Outcome: Progressing Goal: Cardiovascular complication will be avoided Outcome: Progressing   Problem: Activity: Goal: Risk for activity intolerance will decrease Outcome: Progressing   Problem: Nutrition: Goal: Adequate nutrition will be maintained Outcome: Progressing   Problem: Coping: Goal: Level of anxiety will decrease Outcome: Progressing   Problem: Elimination: Goal: Will not experience complications related to bowel motility Outcome: Progressing Goal: Will not experience complications related to urinary retention Outcome: Progressing   Problem: Pain Managment: Goal: General experience of comfort will improve Outcome: Progressing   Problem: Safety: Goal: Ability to remain free from injury will improve Outcome: Progressing   Problem: Skin Integrity: Goal: Risk for impaired skin integrity will decrease Outcome: Progressing   Problem: Education: Goal: Ability to describe self-care measures that may prevent or decrease complications (Diabetes Survival Skills Education) will improve Outcome: Progressing Goal: Individualized Educational Video(s) Outcome: Progressing   Problem: Coping: Goal: Ability to adjust to condition or change in health will improve Outcome: Progressing   Problem: Fluid Volume: Goal: Ability to  maintain a balanced intake and output will improve Outcome: Progressing   Problem: Health Behavior/Discharge Planning: Goal: Ability to identify and utilize available resources and services will improve Outcome: Progressing Goal: Ability to manage health-related needs will improve Outcome: Progressing   Problem: Metabolic: Goal: Ability to maintain appropriate glucose levels will improve Outcome: Progressing   

## 2021-05-19 NOTE — ED Notes (Signed)
Patient transported to MRI 

## 2021-05-19 NOTE — ED Provider Notes (Signed)
[facility] Emergency Department Provider Note    Event Date/Time   First MD Initiated Contact with Patient 05/19/21 0818     (approximate)  I have reviewed the triage vital signs and the nursing notes.   HISTORY  Chief Complaint Wound Infection    HPI Rodney Kelly is a 87 y.o. male below listed past medical history presents to the ER for evaluation of wound that is developing worsening erythema, drainage to the right foot.  Has a history of foot wounds followed by podiatry.  Just completed a course of doxycycline without any improvement is directed to the hospital for admission for IV antibiotics and possible debridement per podiatry.  No other complaints.  Past Medical History:  Diagnosis Date   Anginal pain (HCC)    Arthritis    Complication of anesthesia    spinal anesthesia caused nerve damage in 2002   Coronary artery disease    Diabetes mellitus without complication (HCC)    md stopped metformin   Diabetic ulcer of left foot (HCC)    GERD (gastroesophageal reflux disease)    Hyperlipemia    Multiple thyroid nodules    Myocardial infarction [facility])    STEMI   Osteoporosis    Peripheral nerve disease    Ulceration of colon    NSAIDS   Vitamin D deficiency    No family history on file. Past Surgical History:  Procedure Laterality Date   ABDOMINAL AORTIC ANEURYSM REPAIR  2013   ACHILLES TENDON SURGERY Left 09/28/2018   Procedure: ACHILLES TENDON MICROTENOTOMY-TAL;  Surgeon: Gwyneth Revels, DPM;  Location: [facility] ORS;  Service: Podiatry;  Laterality: Left;   CARPAL TUNNEL RELEASE Right    excision of benign skin lesion     JOINT REPLACEMENT     right knee   SKIN LESION EXCISION     thyroid lobectomy Right    WOUND DEBRIDEMENT Left 09/28/2018   Procedure: DEBRIDEMENT WOUND-SKIN,SUBCUTANEOUS TISSUE;  Surgeon: Gwyneth Revels, DPM;  Location: [facility] ORS;  Service: Podiatry;  Laterality: Left;   There are no problems to display for  this patient.     Prior to Admission medications   Medication Sig Start Date End Date Taking? Authorizing Provider  acetaminophen (TYLENOL) 500 MG tablet Take 1,000 mg by mouth every 8 (eight) hours as needed for moderate pain.    [provider]  albuterol (PROVENTIL HFA;VENTOLIN HFA) 108 (90 Base) MCG/ACT inhaler Inhale 2 puffs into the lungs every 6 (six) hours as needed for wheezing or shortness of breath.    [provider]  alendronate (FOSAMAX) 70 MG tablet Take 70 mg by mouth once a week. Take with a full glass of water on an empty stomach.    [provider]  aspirin 81 MG chewable tablet Chew 81 mg by mouth daily.    [provider]  atorvastatin (LIPITOR) 20 MG tablet Take 20 mg by mouth daily.    [provider]  fenofibrate (TRICOR) 145 MG tablet Take 145 mg by mouth every evening.    [provider]  HYDROcodone-acetaminophen (NORCO) 5-325 MG tablet Take 1 tablet by mouth every 6 (six) hours as needed for moderate pain. 09/28/18   Gwyneth Revels, DPM  metFORMIN (GLUCOPHAGE) 1000 MG tablet Take 1,000 mg by mouth 2 (two) times daily with a meal.    [provider]  nitroGLYCERIN (NITROSTAT) 0.4 MG SL tablet Place 0.4 mg under the tongue every 5 (five) minutes as needed for chest pain.  [provider]  omeprazole (PRILOSEC) 40 MG capsule Take 40 mg by mouth daily.    [provider]  pregabalin (LYRICA) 75 MG capsule Take 75 mg by mouth 2 (two) times daily.     [provider]  sertraline (ZOLOFT) 50 MG tablet Take 50 mg by mouth daily.    [provider]    Allergies Penicillins and Sulfa antibiotics    Social History Social History   Tobacco Use   Smoking status: Former    Pack years: 0.00    Types: Cigarettes    Quit date: 05/24/1992    Years since quitting: 29.0   Smokeless tobacco: Never  Vaping Use   Vaping Use: Never used  Substance Use Topics   Alcohol use:  Not Currently   Drug use: Never    Review of Systems Patient denies headaches, rhinorrhea, blurry vision, numbness, shortness of breath, chest pain, edema, cough, abdominal pain, nausea, vomiting, diarrhea, dysuria, fevers, rashes or hallucinations unless otherwise stated above in HPI. ____________________________________________   PHYSICAL EXAM:  VITAL SIGNS: Vitals:   05/19/21 0721  BP: (!) 108/57  Pulse: (!) 112  Resp: 18  Temp: 98.9 F (37.2 C)  SpO2: 100%    Constitutional: Alert and oriented.  Eyes: Conjunctivae are normal.  Head: Atraumatic. Nose: No congestion/rhinnorhea. Mouth/Throat: Mucous membranes are moist.   Neck: No stridor. Painless ROM.  Cardiovascular: Normal rate, regular rhythm. Grossly normal heart sounds.  Good peripheral circulation. Respiratory: Normal respiratory effort.  No retractions. Lungs CTAB. Gastrointestinal: Soft and nontender. No distention. No abdominal bruits. No CVA tenderness. Genitourinary:  Musculoskeletal: There is an ulcer at the base of the right foot also has ulceration of the left heel.  No drainage from the right foot ulcer right now but does have diffuse edema and erythema of the right foot..  No joint effusions. Neurologic:  Normal speech and language. No gross focal neurologic deficits are appreciated. No facial droop Skin:  Skin is warm, dry and intact. No rash noted. Psychiatric: Mood and affect are normal. Speech and behavior are normal.  ____________________________________________   LABS (all labs ordered are listed, but only abnormal results are displayed)  Results for orders placed or performed during the hospital encounter of 05/19/21 (from the past 24 hour(s))  Lactic acid, plasma     Status: None   Collection Time: 05/19/21  7:28 AM  Result Value Ref Range   Lactic Acid, Venous 1.2 0.5 - 1.9 mmol/L  Comprehensive metabolic panel     Status: Abnormal   Collection Time: 05/19/21  7:28 AM  Result Value Ref  Range   Sodium 139 135 - 145 mmol/L   Potassium 3.8 3.5 - 5.1 mmol/L   Chloride 102 98 - 111 mmol/L   CO2 27 22 - 32 mmol/L   Glucose, Bld 200 (H) 70 - 99 mg/dL   BUN 18 8 - 23 mg/dL   Creatinine, Ser 4.40 0.61 - 1.24 mg/dL   Calcium 9.5 8.9 - 10.2 mg/dL   Total Protein 7.3 6.5 - 8.1 g/dL   Albumin 3.1 (L) 3.5 - 5.0 g/dL   AST 12 (L) 15 - 41 U/L   ALT 12 0 - 44 U/L   Alkaline Phosphatase 74 38 - 126 U/L   Total Bilirubin 0.6 0.3 - 1.2 mg/dL   GFR, Estimated >72 >53 mL/min   Anion gap 10 5 - 15  CBC with Differential     Status: Abnormal   Collection Time: 05/19/21  7:28  AM  Result Value Ref Range   WBC 9.1 4.0 - 10.5 K/uL   RBC 3.68 (L) 4.22 - 5.81 MIL/uL   Hemoglobin 10.5 (L) 13.0 - 17.0 g/dL   HCT 60.7 (L) 37.1 - 06.2 %   MCV 88.9 80.0 - 100.0 fL   MCH 28.5 26.0 - 34.0 pg   MCHC 32.1 30.0 - 36.0 g/dL   RDW 69.4 85.4 - 62.7 %   Platelets 323 150 - 400 K/uL   nRBC 0.0 0.0 - 0.2 %   Neutrophils Relative % 74 %   Neutro Abs 6.8 1.7 - 7.7 K/uL   Lymphocytes Relative 14 %   Lymphs Abs 1.3 0.7 - 4.0 K/uL   Monocytes Relative 9 %   Monocytes Absolute 0.8 0.1 - 1.0 K/uL   Eosinophils Relative 2 %   Eosinophils Absolute 0.2 0.0 - 0.5 K/uL   Basophils Relative 1 %   Basophils Absolute 0.1 0.0 - 0.1 K/uL   Immature Granulocytes 0 %   Abs Immature Granulocytes 0.03 0.00 - 0.07 K/uL   ____________________________________________  EKG My review and personal interpretation at Time: 7:25   Indication: right foot wound  Rate: 100  Rhythm: sinus Axis: normal Other: RBBB, no stemi, nonspecific st abn ____________________________________________  RADIOLOGY   ____________________________________________   PROCEDURES  Procedure(s) performed:  Procedures    Critical Care performed: no ____________________________________________   INITIAL IMPRESSION / ASSESSMENT AND PLAN / ED COURSE  Pertinent labs & imaging results that were available during my care of the patient  were reviewed by me and considered in my medical decision making (see chart for details).   DDX: Diabetic foot wound, ulcer, limb ischemia, sepsis    Rodney Kelly is a 87 y.o. who presents to the ED with presentation as described above.  Currently afebrile hemodynamically stable.  Does have evidence of bilateral foot wounds with new erythema edema of the right foot according to podiatry note but it probes down to the joint with concern for deep space infection was recommended to come to the hospital for IV antibiotics and MRI and possible debridement.  Will order IV antibiotics.  Patient does have a documented penicillin allergy unable to provide any history as to the severity.  Have ordered IV antibiotics.  Will discuss with hospitalist for admission.     The patient was evaluated in Emergency Department today for the symptoms described in the history of present illness. He/she was evaluated in the context of the global COVID-19 pandemic, which necessitated consideration that the patient might be at risk for infection with the SARS-CoV-2 virus that causes COVID-19. Institutional protocols and algorithms that pertain to the evaluation of patients at risk for COVID-19 are in a state of rapid change based on information released by regulatory bodies including the CDC and federal and state organizations. These policies and algorithms were followed during the patient's care in the ED.  As part of my medical decision making, I reviewed the following data within the electronic MEDICAL RECORD NUMBER Nursing notes reviewed and incorporated, Labs reviewed, notes from prior ED visits and [place] Controlled Substance Database   ____________________________________________   FINAL CLINICAL IMPRESSION(S) / ED DIAGNOSES  Final diagnoses:  Wound infection      NEW MEDICATIONS STARTED DURING THIS VISIT:  New Prescriptions   No medications on file     Note:  This document was prepared using Dragon  voice recognition software and may include unintentional dictation errors.    Willy Eddy, MD 05/19/21  0837  

## 2021-05-20 ENCOUNTER — Encounter: Payer: Self-pay | Admitting: Anesthesiology

## 2021-05-20 DIAGNOSIS — L03115 Cellulitis of right lower limb: Secondary | ICD-10-CM

## 2021-05-20 DIAGNOSIS — I2510 Atherosclerotic heart disease of native coronary artery without angina pectoris: Secondary | ICD-10-CM

## 2021-05-20 DIAGNOSIS — I2583 Coronary atherosclerosis due to lipid rich plaque: Secondary | ICD-10-CM

## 2021-05-20 DIAGNOSIS — E118 Type 2 diabetes mellitus with unspecified complications: Secondary | ICD-10-CM

## 2021-05-20 LAB — GLUCOSE, CAPILLARY
Glucose-Capillary: 135 mg/dL — ABNORMAL HIGH (ref 70–99)
Glucose-Capillary: 202 mg/dL — ABNORMAL HIGH (ref 70–99)
Glucose-Capillary: 126 mg/dL — ABNORMAL HIGH (ref 70–99)
Glucose-Capillary: 104 mg/dL — ABNORMAL HIGH (ref 70–99)

## 2021-05-20 LAB — CBC
HCT: 29.6 % — ABNORMAL LOW (ref 39.0–52.0)
Hemoglobin: 9.7 g/dL — ABNORMAL LOW (ref 13.0–17.0)
MCH: 28.2 pg (ref 26.0–34.0)
MCHC: 32.8 g/dL (ref 30.0–36.0)
MCV: 86 fL (ref 80.0–100.0)
Platelets: 281 10*3/uL (ref 150–400)
RBC: 3.44 MIL/uL — ABNORMAL LOW (ref 4.22–5.81)
RDW: 14.9 % (ref 11.5–15.5)
WBC: 7.5 10*3/uL (ref 4.0–10.5)
nRBC: 0 % (ref 0.0–0.2)

## 2021-05-20 LAB — MRSA PCR SCREENING: MRSA by PCR: NEGATIVE

## 2021-05-20 MED ORDER — CHLORHEXIDINE GLUCONATE 4 % EX LIQD: 60.0000 mL | Freq: Once | CUTANEOUS | Status: DC

## 2021-05-20 NOTE — Progress Notes (Signed)
OT Cancellation Note  Patient Details Name: Rodney Kelly MRN: [id number] DOB: [birth year]   Cancelled Treatment:    Reason Eval/Treat Not Completed: Other (comment). OT order received and chart reviewed. Pt with possible surgery tomorrow. Per podiatry note, pt to ambulate with B surgical shoes that are not yet present in room. OT to follow up when pt is next available and pt able to participate with needed equipment. They have been ordered.   Jackquline Denmark, MS, OTR/L , CBIS ascom [phone]  05/20/21, 10:38 AM    05/20/2021, 10:36 AM

## 2021-05-20 NOTE — Progress Notes (Signed)
PROGRESS NOTE    Rodney Kelly  ZOX:[id number] DOB: 1924/07/29 DOA: 05/19/2021 PCP: Wilkie AyeShaner, Brian Allen, MD    Chief Complaint  Patient presents with   Wound Infection    Brief Narrative:   87 y.o. male with medical history significant of DM, HLD, PCD, GERD, CAD, MI, diabetic foot ulcer, dementia who presents with foot ulcer with infection, work-up significant for osteomyelitis, patient was seen by vascular surgery, no further work-up from their side, he has been followed by podiatry.  Assessment & Plan:   Principal Problem:   Diabetic foot infection (HCC) Active Problems:   Coronary artery disease   Type 2 diabetes mellitus with complication, without long-term current use of insulin (HCC)   Diabetic foot ulcers (HCC)   Hyperlipemia   GERD (gastroesophageal reflux disease)   Normocytic anemia   Cellulitis of right lower extremity   Right foot, cellulitis/mew fasciitis with abscess,  osteomyelitis right great toe and first metatarsal bone. - Patient failed outpatient oral antibiotic doxycycline treatment.  -  Patient has tachycardia, but no fever, tachypnea or leukocytosis.  Clinically not septic.  Lactic acid normal 1.2. Dr. Excell SeltzerBaker of podiatry and Dr. Wyn Quakerew of [facility] are consulted. -Continue with broad-spectrum antibiotic coverage, follow on blood cultures -Further plan per podiatry -Neurosurgery input greatly appreciated, no further work-up currently, outpatient follow-up. -No evidence of DVT on venous Dopplers   Coronary artery disease:  -No CP. S/p of stent per his son -ASA, tricor, Imdur -prn NGT   Type 2 diabetes mellitus with complication of foot ulcer, without long-term current use of insulin (HCC): Recent A1c 6.4, well controlled.  Patient taking metformin at home -Sliding scale insulin   Hyperlipemia -Fenofibrate -Patient is not taking Lipitor currently   GERD (gastroesophageal reflux disease) -Protonix   Normocytic anemia:  -Plan, this is most  likely anemia of chronic disease      DVT prophylaxis: Heparin Code Status: Full Family Communication: son at bedside Disposition:   Status is: Inpatient  Remains inpatient appropriate because:IV treatments appropriate due to intensity of illness or inability to take PO  Dispo: The patient is from: Home              Anticipated d/c is to: Home              Patient currently is not medically stable to d/c.   Difficult to place patient No       Consultants:  Podiatry Vascular surgery  Subjective:  No significant events overnight as discussed with staff, patient himself denies any complaints.  Objective: Vitals:   05/19/21 1939 05/19/21 2346 05/20/21 0431 05/20/21 0745  BP: 132/71 128/71 118/69 104/67  Pulse: 76 73 98 81  Resp: 20 18 18 18   Temp: 97.8 F (36.6 C) 98 F (36.7 C) 98 F (36.7 C) 97.7 F (36.5 C)  TempSrc: Oral Oral Oral Oral  SpO2: 99% 96% 96% 97%  Weight:      Height:        Intake/Output Summary (Last 24 hours) at 05/20/2021 1042 Last data filed at 05/19/2021 2308 Gross per 24 hour  Intake --  Output 150 ml  Net -150 ml   Filed Weights   05/19/21 0722  Weight: 59 kg    Examination:  Awake, pleasant, demented, and poor judgment and insight Symmetrical Chest wall movement, Good air movement bilaterally, CTAB RRR,No Gallops,Rubs or new Murmurs, No Parasternal Heave +ve B.Sounds, Abd Soft, No tenderness, No rebound - guarding or rigidity. Right foot bandaged  Data Reviewed: I have personally reviewed following labs and imaging studies  CBC: Recent Labs  Lab 05/19/21 0728 05/20/21 0416  WBC 9.1 7.5  NEUTROABS 6.8  --   HGB 10.5* 9.7*  HCT 32.7* 29.6*  MCV 88.9 86.0  PLT 323 281    Basic Metabolic Panel: Recent Labs  Lab 05/19/21 0728  NA 139  K 3.8  CL 102  CO2 27  GLUCOSE 200*  BUN 18  CREATININE 0.80  CALCIUM 9.5    GFR: Estimated Creatinine Clearance: 47.1 mL/min (by C-G formula based on SCr of 0.8  mg/dL).  Liver Function Tests: Recent Labs  Lab 05/19/21 0728  AST 12*  ALT 12  ALKPHOS 74  BILITOT 0.6  PROT 7.3  ALBUMIN 3.1*    CBG: Recent Labs  Lab 05/19/21 1235 05/19/21 1546 05/19/21 2103 05/20/21 0801  GLUCAP 123* 108* 123* 135*     Recent Results (from the past 240 hour(s))  Blood Cultures x 2 sites     Status: None (Preliminary result)   Collection Time: 05/19/21  9:35 AM   Specimen: BLOOD  Result Value Ref Range Status   Specimen Description BLOOD LEFT AC  Final   Special Requests   Final    BOTTLES DRAWN AEROBIC AND ANAEROBIC Blood Culture results may not be optimal due to an excessive volume of blood received in culture bottles   Culture   Final    NO GROWTH < 24 HOURS Performed at [facility], [address], [place], [place] [address]    Report Status PENDING  Incomplete  Blood Cultures x 2 sites     Status: None (Preliminary result)   Collection Time: 05/19/21  9:35 AM   Specimen: BLOOD  Result Value Ref Range Status   Specimen Description BLOOD  RIGHT [facility]  Final   Special Requests   Final    BOTTLES DRAWN AEROBIC AND ANAEROBIC Blood Culture adequate volume   Culture   Final    NO GROWTH < 24 HOURS Performed at [facility], [address] [place], [place] [address]    Report Status PENDING  Incomplete  SARS CORONAVIRUS 2 (TAT 6-24 HRS) Nasopharyngeal Nasopharyngeal Swab     Status: None   Collection Time: 05/19/21  9:35 AM   Specimen: Nasopharyngeal Swab  Result Value Ref Range Status   SARS Coronavirus 2 NEGATIVE NEGATIVE Final    Comment: (NOTE) SARS-CoV-2 target nucleic acids are NOT DETECTED.  The SARS-CoV-2 RNA is generally detectable in upper and lower respiratory specimens during the acute phase of infection. Negative results do not preclude SARS-CoV-2 infection, do not rule out co-infections with other pathogens, and should not be used as the sole basis for treatment or other patient management  decisions. Negative results must be combined with clinical observations, patient history, and epidemiological information. The expected result is Negative.  Fact Sheet for Patients: HairSlick.no  Fact Sheet for Healthcare Providers: quierodirigir.com  This test is not yet approved or cleared by the [place] FDA and  has been authorized for detection and/or diagnosis of SARS-CoV-2 by FDA under an Emergency Use Authorization (EUA). This EUA will remain  in effect (meaning this test can be used) for the duration of the COVID-19 declaration under Se ction 564(b)(1) of the Act, 21 U.S.C. section 360bbb-3(b)(1), unless the authorization is terminated or revoked sooner.  Performed at [facility], [address], [place], [place] [address]   Aerobic/Anaerobic Culture w Gram Stain (surgical/deep wound)  Status: None (Preliminary result)   Collection Time: 05/19/21 12:36 PM   Specimen: Wound  Result Value Ref Range Status   Specimen Description   Final    WOUND Performed at [facility], [address] [place], [place] [address]    Special Requests   Final    NONE Performed at [facility], [address] [place], [place] [address]    Gram Stain   Final    RARE WBC PRESENT, PREDOMINANTLY MONONUCLEAR RARE GRAM POSITIVE COCCI    Culture   Final    CULTURE REINCUBATED FOR BETTER GROWTH Performed at [facility], [address] [place], [place] [address]    Report Status PENDING  Incomplete         Radiology Studies: MR FOOT RIGHT W WO CONTRAST  Result Date: 05/19/2021 CLINICAL DATA:  Foot pain and swelling. Open wound on the plantar aspect great toe. EXAM: MRI OF THE RIGHT FOREFOOT WITHOUT AND WITH CONTRAST TECHNIQUE: Multiplanar, multisequence MR imaging of the right foot was performed before and after the administration of intravenous contrast. CONTRAST:  41mL GADAVIST GADOBUTROL 1  MMOL/ML IV SOLN COMPARISON:  None. FINDINGS: There is a small open wound on the plantar aspect of the medial forefoot at the level of the first MTP joint. There is a small rim enhancing abscess measuring approximately 15 x 13. Associated osteomyelitis involving medial sesamoid of the great toe. Abnormal T1 and T2 signal intensity and enhancement in the proximal phalanx of the great toe consistent with osteomyelitis. I do not see any definite findings for septic arthritis at the first MTP joint. Suspect osteomyelitis involving first metatarsal head along its plantar aspect. The other bony structures are intact. Diffuse cellulitis myofasciitis without definite findings for pyomyositis. IMPRESSION: 1. Small open wound on the plantar aspect of the medial forefoot at the level of the first MTP joint. Associated underlying soft tissue abscess. 2. Associated osteomyelitis involving the medial sesamoid of the great toe and also the proximal phalanx of the great toe. 3. Suspect osteomyelitis involving the first metatarsal head along its plantar aspect. 4. Diffuse cellulitis and myofasciitis without definite findings pyomyositis. Electronically Signed   By: Rudie Meyer M.D.   On: 05/19/2021 14:37   US Venous Img Lower Unilateral Right (DVT)  Result Date: 05/19/2021 CLINICAL DATA:  Lower extremity edema EXAM: RIGHT LOWER EXTREMITY VENOUS DUPLEX ULTRASOUND TECHNIQUE: Gray-scale sonography with graded compression, as well as color Doppler and duplex ultrasound were performed to evaluate the right lower extremity deep venous system from the level of the common femoral vein and including the common femoral, femoral, profunda femoral, popliteal and calf veins including the posterior tibial, peroneal and gastrocnemius veins when visible. The superficial great saphenous vein was also interrogated. Spectral Doppler was utilized to evaluate flow at rest and with distal augmentation maneuvers in the common femoral, femoral and  popliteal veins. COMPARISON:  None. FINDINGS: Contralateral Common Femoral Vein: Respiratory phasicity is normal and symmetric with the symptomatic side. No evidence of thrombus. Normal compressibility. Common Femoral Vein: No evidence of thrombus. Normal compressibility, respiratory phasicity and response to augmentation. Saphenofemoral Junction: No evidence of thrombus. Normal compressibility and flow on color Doppler imaging. Profunda Femoral Vein: No evidence of thrombus. Normal compressibility and flow on color Doppler imaging. Femoral Vein: No evidence of thrombus. Normal compressibility, respiratory phasicity and response to augmentation. Popliteal Vein: No evidence of thrombus. Normal compressibility, respiratory phasicity and response to augmentation. Calf Veins: No evidence of thrombus. Normal compressibility  and flow on color Doppler imaging. Superficial Great Saphenous Vein: No evidence of thrombus. Normal compressibility. Venous Reflux:  None. Other Findings:  None. IMPRESSION: No evidence of deep venous thrombosis in the right lower extremity. Left common femoral vein also patent. Electronically Signed   By: Bretta Bang III M.D.   On: 05/19/2021 12:09        Scheduled Meds:  aspirin  81 mg Oral Daily   fenofibrate  160 mg Oral Daily   heparin  5,000 Units Subcutaneous Q8H   insulin aspart  0-5 Units Subcutaneous QHS   insulin aspart  0-9 Units Subcutaneous TID WC   isosorbide mononitrate  30 mg Oral Daily   mupirocin cream   Topical Daily   pantoprazole  40 mg Oral Daily   pregabalin  75 mg Oral BID   sodium chloride flush  10 mL Intravenous Q12H   Continuous Infusions:  vancomycin       LOS: 1 day       Huey Bienenstock, MD [facility]   To contact the attending provider between 7A-7P or the covering provider during after hours 7P-7A, please log into the web site [email or web address] and access using universal [place] password for that web site. If you do not  have the password, please call the hospital operator.  05/20/2021, 10:42 AM

## 2021-05-20 NOTE — Progress Notes (Signed)
PT Cancellation Note  Patient Details Name: Rodney Kelly MRN: [id number] DOB: 09-11-1924   Cancelled Treatment:    Reason Eval/Treat Not Completed: Medical issues which prohibited therapy Orders for PT this am, seen by podiatry later in the day and recommending great toe amp, slated for tomorrow. Per podiatry will also need b/l post-op shoes, not in room.  Awaiting further orders/recs post op, not appropriate for PT this date.  Malachi Pro, DPT 05/20/2021, 5:10 PM

## 2021-05-20 NOTE — TOC Progression Note (Signed)
Transition of Care [facility]) - Progression Note    Patient Details  Name: Kamoni Gentles MRN: [id number] Date of Birth: 1924/04/30  [facility]) CM/SW Contact  Caryn Section, RN Phone Number: 05/20/2021, 9:43 AM  Clinical Narrative:   Patient son at bedside, states he and daughter in law assist patient at home. Son expresses that he and his wife are starting to experience caregiver burnout.  He has asked for assistance at home.  RNCM sent messages to care team for nurse aide assistance at home and PT eval, as patient walks with a walker at home.  RNCM provided resources for CARES act and contact information for [facility] [place] and the Alzheimer's Association website and helpline numbers.  RNCM discussed paid caregivers vs home health, son stated he is looking into options.  TOC contact information given, TOC to follow through discharge.    Expected Discharge Plan: Home w Home Health Services Barriers to Discharge: Continued Medical Work up  Expected Discharge Plan and Services Expected Discharge Plan: Home w Home Health Services   Discharge Planning Services: CM Consult Post Acute Care Choice: Home Health                                         Social Determinants of Health (SDOH) Interventions    Readmission Risk Interventions No flowsheet data found.

## 2021-05-20 NOTE — Plan of Care (Signed)
  Problem: Activity: Goal: Risk for activity intolerance will decrease Outcome: Progressing   Problem: Health Behavior/Discharge Planning: Goal: Ability to manage health-related needs will improve Outcome: Not Progressing   Problem: Coping: Goal: Level of anxiety will decrease Outcome: Not Progressing Note: Patient is very anxious and confused this shift. Administered ativan. Patient is trying to elope from bed. Patient is refusing medications.

## 2021-05-20 NOTE — Progress Notes (Signed)
Daily Progress Note   Subjective  - * No surgery date entered *  Follow-up right foot and left foot wounds.  Right foot worsening infection.  Underwent MRI.  Objective Vitals:   05/19/21 2346 05/20/21 0431 05/20/21 0745 05/20/21 1157  BP: 128/71 118/69 104/67 (!) 92/56  Pulse: 73 98 81 81  Resp: 18 18 18 16   Temp: 98 F (36.7 C) 98 F (36.7 C) 97.7 F (36.5 C) 97.6 F (36.4 C)  TempSrc: Oral Oral Oral Oral  SpO2: 96% 96% 97% 98%  Weight:      Height:        Physical Exam: MRI is consistent with osteomyelitis of the proximal phalanx sesamoid and first metatarsal.  Laboratory CBC    Component Value Date/Time   WBC 7.5 05/20/2021 0416   HGB 9.7 (L) 05/20/2021 0416   HCT 29.6 (L) 05/20/2021 0416   PLT 281 05/20/2021 0416    BMET    Component Value Date/Time   NA 139 05/19/2021 0728   K 3.8 05/19/2021 0728   CL 102 05/19/2021 0728   CO2 27 05/19/2021 0728   GLUCOSE 200 (H) 05/19/2021 0728   BUN 18 05/19/2021 0728   CREATININE 0.80 05/19/2021 0728   CALCIUM 9.5 05/19/2021 0728   GFRNONAA >60 05/19/2021 0728   GFRAA >60 09/28/2018 0749    Assessment/Planning: Osteomyelitis right first metatarsal phalangeal joint Left heel ulceration  I discussed with the patient as well as the patient's son that he has obvious infection in the great toe joint region.  I would recommend amputation of the first ray.  I have discussed this with Dr. 09/30/2018 as well.  He will plan for surgery tomorrow.  Plan is for first ray amputation and likely debridement of the ulcer on the left heel. I discussed the risk benefits alternatives and complications with the family today.  Verbal consent has been given.  Dr. Excell Seltzer has placed orders.   Excell Seltzer A  05/20/2021, 12:32 PM

## 2021-05-21 ENCOUNTER — Encounter
Admission: EM | Disposition: A | Discharge status: Skilled Nursing Facility | Payer: Self-pay | Source: Home / Self Care | Attending: Obstetrics and Gynecology

## 2021-05-21 ENCOUNTER — Inpatient Hospital Stay: Payer: Medicare PPO

## 2021-05-21 DIAGNOSIS — T17908S Unspecified foreign body in respiratory tract, part unspecified causing other injury, sequela: Secondary | ICD-10-CM

## 2021-05-21 LAB — BASIC METABOLIC PANEL
Anion gap: 6 (ref 5–15)
BUN: 15 mg/dL (ref 8–23)
CO2: 27 mmol/L (ref 22–32)
Calcium: 9.2 mg/dL (ref 8.9–10.3)
Chloride: 103 mmol/L (ref 98–111)
Creatinine, Ser: 0.55 mg/dL — ABNORMAL LOW (ref 0.61–1.24)
GFR, Estimated: >60 mL/min (ref >60)
Glucose, Bld: 144 mg/dL — ABNORMAL HIGH (ref 70–99)
Potassium: 3.6 mmol/L (ref 3.5–5.1)
Sodium: 136 mmol/L (ref 135–145)

## 2021-05-21 LAB — CBC
HCT: 32.1 % — ABNORMAL LOW (ref 39.0–52.0)
Hemoglobin: 10.4 g/dL — ABNORMAL LOW (ref 13.0–17.0)
MCH: 28.4 pg (ref 26.0–34.0)
MCHC: 32.4 g/dL (ref 30.0–36.0)
MCV: 87.7 fL (ref 80.0–100.0)
Platelets: 337 10*3/uL (ref 150–400)
RBC: 3.66 MIL/uL — ABNORMAL LOW (ref 4.22–5.81)
RDW: 14.7 % (ref 11.5–15.5)
WBC: 10.2 10*3/uL (ref 4.0–10.5)
nRBC: 0 % (ref 0.0–0.2)

## 2021-05-21 LAB — GLUCOSE, CAPILLARY
Glucose-Capillary: 145 mg/dL — ABNORMAL HIGH (ref 70–99)
Glucose-Capillary: 128 mg/dL — ABNORMAL HIGH (ref 70–99)
Glucose-Capillary: 146 mg/dL — ABNORMAL HIGH (ref 70–99)
Glucose-Capillary: 140 mg/dL — ABNORMAL HIGH (ref 70–99)

## 2021-05-21 LAB — CREATININE, SERUM
Creatinine, Ser: 0.63 mg/dL (ref 0.61–1.24)
GFR, Estimated: >60 mL/min (ref >60)

## 2021-05-21 LAB — PROCALCITONIN: Procalcitonin: <0.1 ng/mL

## 2021-05-21 SURGERY — AMPUTATION, TOE
Anesthesia: Choice | Site: Toe | Laterality: Right

## 2021-05-21 MED ORDER — CHLORHEXIDINE GLUCONATE CLOTH 2 % EX PADS
6.0000 | MEDICATED_PAD | Freq: Every day | CUTANEOUS | Status: DC
  Administered 2021-05-21 – 2021-06-08 (×16): 6 via TOPICAL

## 2021-05-21 MED ORDER — MUPIROCIN 2 % EX OINT
TOPICAL_OINTMENT | Freq: Every day | CUTANEOUS | Status: DC
  Administered 2021-06-02: 1 via TOPICAL
  Filled 2021-05-21: qty 22

## 2021-05-21 MED ORDER — LACTATED RINGERS IV SOLN: INTRAVENOUS | Status: DC

## 2021-05-21 MED ORDER — VANCOMYCIN HCL IN DEXTROSE 1-5 GM/200ML-% IV SOLN
1000.0000 mg | INTRAVENOUS | Status: DC
  Administered 2021-05-21: 1000 mg via INTRAVENOUS
  Filled 2021-05-21 (×2): qty 200

## 2021-05-21 MED ORDER — METRONIDAZOLE 500 MG/100ML IV SOLN
500.0000 mg | Freq: Three times a day (TID) | INTRAVENOUS | Status: AC
  Administered 2021-05-21 – 2021-05-25 (×14): 500 mg via INTRAVENOUS
  Filled 2021-05-21 (×16): qty 100

## 2021-05-21 MED ORDER — TAMSULOSIN HCL 0.4 MG PO CAPS
0.4000 mg | ORAL_CAPSULE | Freq: Every day | ORAL | Status: DC
  Administered 2021-05-24 – 2021-06-07 (×15): 0.4 mg via ORAL
  Filled 2021-05-21 (×13): qty 1

## 2021-05-21 MED ORDER — METOPROLOL TARTRATE 5 MG/5ML IV SOLN
2.5000 mg | Freq: Once | INTRAVENOUS | Status: AC
  Administered 2021-05-21: 2.5 mg via INTRAVENOUS
  Filled 2021-05-21: qty 5

## 2021-05-21 MED ORDER — ACETAMINOPHEN 650 MG RE SUPP
650.0000 mg | RECTAL | Status: AC | PRN
  Administered 2021-05-21: 650 mg via RECTAL
  Filled 2021-05-21: qty 1

## 2021-05-21 MED ORDER — SODIUM CHLORIDE 0.9 % IV SOLN
2.0000 g | Freq: Two times a day (BID) | INTRAVENOUS | Status: DC
  Administered 2021-05-21 – 2021-05-24 (×6): 2 g via INTRAVENOUS
  Filled 2021-05-21 (×8): qty 2

## 2021-05-21 MED ORDER — HALOPERIDOL LACTATE 5 MG/ML IJ SOLN
1.0000 mg | Freq: Four times a day (QID) | INTRAMUSCULAR | Status: DC | PRN
  Administered 2021-05-21 – 2021-05-22 (×2): 1 mg via INTRAVENOUS
  Filled 2021-05-21 (×2): qty 1

## 2021-05-21 SURGICAL SUPPLY — 63 items
BAG COUNTER SPONGE EZ (MISCELLANEOUS) ×3 IMPLANT
BLADE MED AGGRESSIVE (BLADE) ×3 IMPLANT
BLADE OSC/SAGITTAL MD 5.5X18 (BLADE) IMPLANT
BLADE OSCILLATING/SAGITTAL (BLADE)
BLADE SURG 15 STRL LF DISP TIS (BLADE) IMPLANT
BLADE SURG 15 STRL SS (BLADE)
BLADE SURG MINI STRL (BLADE) IMPLANT
BLADE SW THK.38XMED LNG THN (BLADE) IMPLANT
BNDG CONFORM 2 STRL LF (GAUZE/BANDAGES/DRESSINGS) IMPLANT
BNDG CONFORM 3 STRL LF (GAUZE/BANDAGES/DRESSINGS) IMPLANT
BNDG ELASTIC 3X5.8 VLCR NS LF (GAUZE/BANDAGES/DRESSINGS) IMPLANT
BNDG ELASTIC 4X5.8 VLCR NS LF (GAUZE/BANDAGES/DRESSINGS) IMPLANT
BNDG ELASTIC 4X5.8 VLCR STR LF (GAUZE/BANDAGES/DRESSINGS) ×3 IMPLANT
BNDG ESMARK 4X12 TAN STRL LF (GAUZE/BANDAGES/DRESSINGS) ×3 IMPLANT
BNDG GAUZE 4.5X4.1 6PLY STRL (MISCELLANEOUS) ×3 IMPLANT
CANISTER SUCT 1200ML W/VALVE (MISCELLANEOUS) ×3 IMPLANT
CNTNR SPEC 2.5X3XGRAD LEK (MISCELLANEOUS) ×2
CONT SPEC 4OZ STER OR WHT (MISCELLANEOUS) ×1
CONTAINER SPEC 2.5X3XGRAD LEK (MISCELLANEOUS) ×2 IMPLANT
COVER WAND RF STERILE (DRAPES) ×3 IMPLANT
CUFF TOURN SGL QUICK 12 (TOURNIQUET CUFF) IMPLANT
CUFF TOURN SGL QUICK 18X4 (TOURNIQUET CUFF) IMPLANT
DRAPE FLUOR MINI C-ARM 54X84 (DRAPES) IMPLANT
DURAPREP 26ML APPLICATOR (WOUND CARE) ×3 IMPLANT
ELECT REM PT RETURN 9FT ADLT (ELECTROSURGICAL) ×3
ELECTRODE REM PT RTRN 9FT ADLT (ELECTROSURGICAL) ×2 IMPLANT
GAUZE PACKING 1/4 X5 YD (GAUZE/BANDAGES/DRESSINGS) IMPLANT
GAUZE PACKING IODOFORM 1X5 (PACKING) IMPLANT
GAUZE SPONGE 4X4 12PLY STRL (GAUZE/BANDAGES/DRESSINGS) ×6 IMPLANT
GAUZE XEROFORM 1X8 LF (GAUZE/BANDAGES/DRESSINGS) ×3 IMPLANT
GLOVE SURG ENC MOIS LTX SZ7 (GLOVE) ×3 IMPLANT
GLOVE SURG UNDER LTX SZ7 (GLOVE) ×3 IMPLANT
GOWN STRL REUS W/ TWL LRG LVL3 (GOWN DISPOSABLE) ×4 IMPLANT
GOWN STRL REUS W/TWL LRG LVL3 (GOWN DISPOSABLE) ×2
HANDPIECE VERSAJET DEBRIDEMENT (MISCELLANEOUS) IMPLANT
IV NS 1000ML (IV SOLUTION)
IV NS 1000ML BAXH (IV SOLUTION) IMPLANT
IV NS IRRIG 3000ML ARTHROMATIC (IV SOLUTION) IMPLANT
KIT TURNOVER KIT A (KITS) ×3 IMPLANT
LABEL OR SOLS (LABEL) ×3 IMPLANT
MANIFOLD NEPTUNE II (INSTRUMENTS) ×3 IMPLANT
NEEDLE FILTER BLUNT 18X 1/2SAF (NEEDLE) ×1
NEEDLE FILTER BLUNT 18X1 1/2 (NEEDLE) ×2 IMPLANT
NEEDLE HYPO 25X1 1.5 SAFETY (NEEDLE) ×6 IMPLANT
NS IRRIG 500ML POUR BTL (IV SOLUTION) ×3 IMPLANT
PACK EXTREMITY ARMC (MISCELLANEOUS) ×3 IMPLANT
PAD ABD DERMACEA PRESS 5X9 (GAUZE/BANDAGES/DRESSINGS) ×3 IMPLANT
PULSAVAC PLUS IRRIG FAN TIP (DISPOSABLE)
RASP SM TEAR CROSS CUT (RASP) IMPLANT
SOL PREP PVP 2OZ (MISCELLANEOUS) ×3
SOLUTION PREP PVP 2OZ (MISCELLANEOUS) ×2 IMPLANT
STOCKINETTE STRL 6IN 960660 (GAUZE/BANDAGES/DRESSINGS) ×3 IMPLANT
SUT ETHILON 3-0 FS-10 30 BLK (SUTURE) ×6
SUT ETHILON 4-0 (SUTURE)
SUT ETHILON 4-0 FS2 18XMFL BLK (SUTURE)
SUT VIC AB 3-0 SH 27 (SUTURE) ×1
SUT VIC AB 3-0 SH 27X BRD (SUTURE) ×2 IMPLANT
SUT VIC AB 4-0 FS2 27 (SUTURE) IMPLANT
SUTURE EHLN 3-0 FS-10 30 BLK (SUTURE) ×4 IMPLANT
SUTURE ETHLN 4-0 FS2 18XMF BLK (SUTURE) IMPLANT
SWAB CULTURE AMIES ANAERIB BLU (MISCELLANEOUS) IMPLANT
SYR 10ML LL (SYRINGE) ×3 IMPLANT
TIP FAN IRRIG PULSAVAC PLUS (DISPOSABLE) IMPLANT

## 2021-05-21 NOTE — TOC Progression Note (Signed)
Transition of Care [facility]) - Progression Note    Patient Details  Name: Gabryel Files MRN: [id number] Date of Birth: [birth year]  Transition [facility]) CM/SW Contact  Caryn Section, RN Phone Number: 05/21/2021, 2:07 PM  Clinical Narrative:   TOC in to see patient and son.  Son states the plan is for care team to watch patient and determine when he is cleared for surgery, possibly on Monday.    Son is looking into CARES Act and Alzheimer's/Dementia resources as well to assist in discharge planning for patient.  TOC contact information given, TOC to follow to discharge.    Expected Discharge Plan: Home w Home Health Services Barriers to Discharge: Continued Medical Work up  Expected Discharge Plan and Services Expected Discharge Plan: Home w Home Health Services   Discharge Planning Services: CM Consult Post Acute Care Choice: Home Health                                         Social Determinants of Health (SDOH) Interventions    Readmission Risk Interventions No flowsheet data found.

## 2021-05-21 NOTE — Progress Notes (Signed)
PT Cancellation Note  Patient Details Name: Rodney Kelly MRN: [id number] DOB: Jan 03, 1924   Cancelled Treatment:    Reason Eval/Treat Not Completed: Medical issues which prohibited therapy;Other (comment) (Patient not medically ready. Chart reviewed. Pt scheduled for first ray amputation and likely debridement of the ulcer on the left heel, per podiatry. Will also need b/l post-op shoes. Awaiting further orders/recs post op.)   Keryn Nessler A Sie Formisano 05/21/2021, 9:34 AM

## 2021-05-21 NOTE — Progress Notes (Signed)
OT Cancellation Note  Patient Details Name: Rodney Kelly MRN: [id number] DOB: April 02, 1924   Cancelled Treatment:    Reason Eval/Treat Not Completed: Patient not medically ready. Chart reviewed. Pt scheduled for first ray amputation and likely debridement of the ulcer on the left heel, per podiatry. Will also need b/l post-op shoes. Awaiting further orders/recs post op.   Wynona Canes, MPH, MS, OTR/L ascom [phone] 05/21/21, 8:30 AM

## 2021-05-21 NOTE — Care Management Important Message (Signed)
Important Message  Patient Details  Name: Rodney Kelly MRN: [id number] Date of Birth: 07-Apr-1924   Medicare Important Message Given:  N/A - LOS <3 / Initial given by admissions     [name] A Athalene Kolle 05/21/2021, 9:05 AM

## 2021-05-21 NOTE — Consult Note (Addendum)
Pharmacy Antibiotic Note  Rodney Kelly is a 87 y.o. male w/ h/o HLD, CAD, STEMI, DM, ,admitted on 05/19/2021 with  Diabetic Foot Infection .  Pharmacy consulted for Vancomycin dosing 6/15. Pharmacy now consulted for cefepime dosing for HCAP  Plan: Day 3 Abx  Continue vanc 1g q24h (6/16>> ) - Predicted AUC 541, Cmax 36, Cmin 13.4  - calculated on Scr 0.8 (at BL 0.8), Vd 0.72, TBW<IBW  Start Cefepime 2g IV every 12 hours.    Height: 5\' 9"  (175.3 cm) Weight: 59 kg (130 lb) IBW/kg (Calculated) : 70.7  Temp (24hrs), Avg:97.6 F (36.4 C), Min:97.5 F (36.4 C), Max:97.8 F (36.6 C)  Recent Labs  Lab 05/19/21 0728 05/20/21 0416 05/21/21 0404  WBC 9.1 7.5  --   CREATININE 0.80  --  0.63  LATICACIDVEN 1.2  --   --      Estimated Creatinine Clearance: 47.1 mL/min (by C-G formula based on SCr of 0.63 mg/dL).    Allergies  Allergen Reactions   Penicillins Other (See Comments)    unknown   Sulfa Antibiotics Other (See Comments)    unknown    Antimicrobials this admission: Vanc (6/15 >>  AZT (6/15) CEfepime (6/17>>  Dose adjustments this admission: CTM renal fxn; adust prn  Microbiology results: 6/15 BCx: NGTD 6/15 Covid swab: negative  6/15 MRSA PCR: negative  6/15 7/15 STAPHYLOCOCCUS EPIDERMIDIS   Thank you for allowing pharmacy to be a part of this patient's care.  YTR:ZNBV, PharmD, BCPS Clinical Pharmacist 05/21/2021 8:21 AM

## 2021-05-21 NOTE — Progress Notes (Signed)
Patient is congested, and has crackles throughout, oxygen level is in the 80's on room air. Patient has been treated with iv ativan twice this shift for agitation. Patient is asleep and unable to do a deep cough. Patient has been placed on 3liters of oxygen and oral suctioning has been performed. Updated Dr. Arville Care about his condition, awaiting orders.

## 2021-05-21 NOTE — Progress Notes (Signed)
Patient's HR elevated  sustaining in the 120s -130s, BP 168/98 , MAP 114 , SPO2 99% on 2L. Provider on call notified, LR increased to 100 cc/hr and lopressor 2.5mg  IV ordered.

## 2021-05-21 NOTE — Progress Notes (Signed)
    BRIEF OVERNIGHT PROGRESS REPORT  Notified by RN over concern of possible aspiration of fluid by patient. Patient is sitting up in bed and calm on 3LNC O2 sats on portable device are +/- 91% while on 4LPM [place]. He does not appear in distress and does not show use of accessory muscles. RR 16-20 HR 70s Patient did clear his throat on his own while being evaluated. He has a history of agitation and dementia and has received medication for this. Chest x-ray and breathing treatment are ordered.  Discussed with Dr. Arville Care.   Chinita Greenland MSNA ACNPC-AG Acute Care Nurse Practitioner [facility]

## 2021-05-21 NOTE — Progress Notes (Signed)
PODIATRY / FOOT AND ANKLE SURGERY PROGRESS NOTE  Requesting Physician: Dr. Clyde Lundborg  Reason for consult: Foot wounds, infection right foot  Chief Complaint: Right foot wound infection   HPI: Rodney Kelly is a 87 y.o. male who presents today resting in bed but moaning and appearing to be out of it.  Patient's son is bedside.  Patient was noted to have aspiration yesterday.  Patient's surgery has been canceled for today due to concerns for aspiration.  Patient's son states that he is acting more unusual today and not with it.  PMHx:  Past Medical History:  Diagnosis Date   Anginal pain (HCC)    Arthritis    Complication of anesthesia    spinal anesthesia caused nerve damage in 2002   Coronary artery disease    Diabetes mellitus without complication (HCC)    md stopped metformin   Diabetic ulcer of left foot (HCC)    GERD (gastroesophageal reflux disease)    Hyperlipemia    Multiple thyroid nodules    Myocardial infarction (HCC)    STEMI   Osteoporosis    Peripheral nerve disease    Ulceration of colon    NSAIDS   Vitamin D deficiency     Surgical Hx:  Past Surgical History:  Procedure Laterality Date   ABDOMINAL AORTIC ANEURYSM REPAIR  2013   ACHILLES TENDON SURGERY Left 09/28/2018   Procedure: ACHILLES TENDON MICROTENOTOMY-TAL;  Surgeon: Gwyneth Revels, DPM;  Location: [facility] ORS;  Service: Podiatry;  Laterality: Left;   CARPAL TUNNEL RELEASE Right    excision of benign skin lesion     JOINT REPLACEMENT     right knee   SKIN LESION EXCISION     thyroid lobectomy Right    WOUND DEBRIDEMENT Left 09/28/2018   Procedure: DEBRIDEMENT WOUND-SKIN,SUBCUTANEOUS TISSUE;  Surgeon: Gwyneth Revels, DPM;  Location: [facility] OR[facility];  Service: Podiatry;  Laterality: Left;    FHx: No family history on file.  Social History:  reports that he quit smoking about 29 years ago. His smoking use included cigarettes. He has never used smokeless tobacco. He reports previous alcohol use. He  reports that he does not use drugs.  Allergies:  Allergies  Allergen Reactions   Penicillins Other (See Comments)    unknown   Sulfa Antibiotics Other (See Comments)    unknown    Medications Prior to Admission  Medication Sig Dispense Refill   acetaminophen (TYLENOL) 500 MG tablet Take 1,000 mg by mouth every 8 (eight) hours as needed for moderate pain.     aspirin 81 MG chewable tablet Chew 81 mg by mouth daily.     fenofibrate (TRICOR) 145 MG tablet Take 145 mg by mouth every evening.     fenofibrate (TRICOR) 145 MG tablet      isosorbide mononitrate (IMDUR) 30 MG 24 hr tablet Take by mouth.     metFORMIN (GLUCOPHAGE) 1000 MG tablet Take 1,000 mg by mouth 2 (two) times daily with a meal.     omeprazole (PRILOSEC) 40 MG capsule Take 40 mg by mouth daily.     pregabalin (LYRICA) 75 MG capsule Take 75 mg by mouth 2 (two) times daily.      albuterol (PROVENTIL HFA;VENTOLIN HFA) 108 (90 Base) MCG/ACT inhaler Inhale 2 puffs into the lungs every 6 (six) hours as needed for wheezing or shortness of breath. (Patient not taking: No sig reported)     alendronate (FOSAMAX) 70 MG tablet Take 70 mg by mouth once a week. Take with a  full glass of water on an empty stomach. (Patient not taking: No sig reported)     atorvastatin (LIPITOR) 20 MG tablet Take 20 mg by mouth daily. (Patient not taking: No sig reported)     HYDROcodone-acetaminophen (NORCO) 5-325 MG tablet Take 1 tablet by mouth every 6 (six) hours as needed for moderate pain. (Patient not taking: No sig reported) 15 tablet 0   nitroGLYCERIN (NITROSTAT) 0.4 MG SL tablet Place 0.4 mg under the tongue every 5 (five) minutes as needed for chest pain.     sertraline (ZOLOFT) 50 MG tablet Take 50 mg by mouth daily. (Patient not taking: No sig reported)       Physical Exam: Vascular: DP/PT pulses +1 bilateral, capillary fill time appears to be intact to digits bilateral.  Both feet appear to be warm to touch right greater than left.  No hair  growth noted to digits to bilateral lower extremities.  Mild edema and erythema to the right forefoot and midfoot, greatly improved overall since admission.  Neuro: Light touch sensation absent to bilateral lower extremities.  Derm: Ulceration present to the plantar aspect of the left heel.  Appears to be stable but does probe to the deep subcutaneous tissue, no bone exposed.  No associated erythema or edema, minimal serous drainage.  Measures approximately 0.1 x 0.1 x 0.4 cm  Right plantar first metatarsal phalangeal joint ulceration measures approximately 0.3 x 0.2 x 0.6 cm and probes deep very close to the sesamoids and first metatarsal phalangeal joint to hard stop concerning for depth of wound to bone.  Associated purulent drainage as well as erythema and edema that extends from the ulceration to the midfoot.  MSK: 4/5 strength bilateral lower extremities.  Results for orders placed or performed during the hospital encounter of 05/19/21 (from the past 48 hour(s))  Urinalysis, Complete w Microscopic     Status: Abnormal   Collection Time: 05/19/21  3:26 PM  Result Value Ref Range   Color, Urine YELLOW (A) YELLOW   APPearance CLEAR (A) CLEAR   Specific Gravity, Urine 1.017 1.005 - 1.030   pH 6.0 5.0 - 8.0   Glucose, UA NEGATIVE NEGATIVE mg/dL   Hgb urine dipstick NEGATIVE NEGATIVE   Bilirubin Urine NEGATIVE NEGATIVE   Ketones, ur NEGATIVE NEGATIVE mg/dL   Protein, ur NEGATIVE NEGATIVE mg/dL   Nitrite NEGATIVE NEGATIVE   Leukocytes,Ua NEGATIVE NEGATIVE   RBC / HPF 0-5 0 - 5 RBC/hpf   WBC, UA 0-5 0 - 5 WBC/hpf   Bacteria, UA RARE (A) NONE SEEN   Squamous Epithelial / LPF 0-5 0 - 5   Mucus PRESENT     Comment: Performed at [facility], [address] [place], [place] [address]  Glucose, capillary     Status: Abnormal   Collection Time: 05/19/21  3:46 PM  Result Value Ref Range   Glucose-Capillary 108 (H) 70 - 99 mg/dL    Comment: Glucose reference range applies only to  samples taken after fasting for at least 8 hours.  Glucose, capillary     Status: Abnormal   Collection Time: 05/19/21  9:03 PM  Result Value Ref Range   Glucose-Capillary 123 (H) 70 - 99 mg/dL    Comment: Glucose reference range applies only to samples taken after fasting for at least 8 hours.  CBC     Status: Abnormal   Collection Time: 05/20/21  4:16 AM  Result Value Ref Range   WBC 7.5 4.0 - 10.5 K/uL  RBC 3.44 (L) 4.22 - 5.81 MIL/uL   Hemoglobin 9.7 (L) 13.0 - 17.0 g/dL   HCT 48.5 (L) 46.2 - 70.3 %   MCV 86.0 80.0 - 100.0 fL   MCH 28.2 26.0 - 34.0 pg   MCHC 32.8 30.0 - 36.0 g/dL   RDW 50.0 93.8 - 18.2 %   Platelets 281 150 - 400 K/uL   nRBC 0.0 0.0 - 0.2 %    Comment: Performed at [facility], [address] [place], [place] [address]  Glucose, capillary     Status: Abnormal   Collection Time: 05/20/21  8:01 AM  Result Value Ref Range   Glucose-Capillary 135 (H) 70 - 99 mg/dL    Comment: Glucose reference range applies only to samples taken after fasting for at least 8 hours.  Glucose, capillary     Status: Abnormal   Collection Time: 05/20/21 11:29 AM  Result Value Ref Range   Glucose-Capillary 202 (H) 70 - 99 mg/dL    Comment: Glucose reference range applies only to samples taken after fasting for at least 8 hours.  MRSA PCR Screening     Status: None   Collection Time: 05/20/21  3:50 PM  Result Value Ref Range   MRSA by PCR NEGATIVE NEGATIVE    Comment:        The GeneXpert MRSA Assay (FDA approved for NASAL specimens only), is one component of a comprehensive MRSA colonization surveillance program. It is not intended to diagnose MRSA infection nor to guide or monitor treatment for MRSA infections. Performed at [facility], [address] [place], [place] [address]   Glucose, capillary     Status: Abnormal   Collection Time: 05/20/21  4:01 PM  Result Value Ref Range   Glucose-Capillary 126 (H) 70 - 99 mg/dL    Comment: Glucose  reference range applies only to samples taken after fasting for at least 8 hours.  Glucose, capillary     Status: Abnormal   Collection Time: 05/20/21 10:47 PM  Result Value Ref Range   Glucose-Capillary 104 (H) 70 - 99 mg/dL    Comment: Glucose reference range applies only to samples taken after fasting for at least 8 hours.   Comment 1 Notify RN   Creatinine, serum     Status: None   Collection Time: 05/21/21  4:04 AM  Result Value Ref Range   Creatinine, Ser 0.63 0.61 - 1.24 mg/dL   GFR, Estimated >93 >81 mL/min    Comment: (NOTE) Calculated using the CKD-EPI Creatinine Equation (2021) Performed at [facility], [address] [place], [place] [address]   Glucose, capillary     Status: Abnormal   Collection Time: 05/21/21  8:28 AM  Result Value Ref Range   Glucose-Capillary 145 (H) 70 - 99 mg/dL    Comment: Glucose reference range applies only to samples taken after fasting for at least 8 hours.  CBC     Status: Abnormal   Collection Time: 05/21/21  9:30 AM  Result Value Ref Range   WBC 10.2 4.0 - 10.5 K/uL   RBC 3.66 (L) 4.22 - 5.81 MIL/uL   Hemoglobin 10.4 (L) 13.0 - 17.0 g/dL   HCT 02.5 (L) 85.2 - 77.8 %   MCV 87.7 80.0 - 100.0 fL   MCH 28.4 26.0 - 34.0 pg   MCHC 32.4 30.0 - 36.0 g/dL   RDW 24.2 35.3 - 61.4 %   Platelets 337 150 - 400 K/uL   nRBC 0.0 0.0 -  0.2 %    Comment: Performed at [facility], [address] [place], [place] [address]  Basic metabolic panel     Status: Abnormal   Collection Time: 05/21/21  9:30 AM  Result Value Ref Range   Sodium 136 135 - 145 mmol/L   Potassium 3.6 3.5 - 5.1 mmol/L   Chloride 103 98 - 111 mmol/L   CO2 27 22 - 32 mmol/L   Glucose, Bld 144 (H) 70 - 99 mg/dL    Comment: Glucose reference range applies only to samples taken after fasting for at least 8 hours.   BUN 15 8 - 23 mg/dL   Creatinine, Ser 2.130.55 (L) 0.61 - 1.24 mg/dL   Calcium 9.2 8.9 - 08.610.3 mg/dL   GFR, Estimated >57>60 >84>60 mL/min    Comment:  (NOTE) Calculated using the CKD-EPI Creatinine Equation (2021)    Anion gap 6 5 - 15    Comment: Performed at [facility], [address] [place], [place] [address]  Procalcitonin - Baseline     Status: None   Collection Time: 05/21/21  9:30 AM  Result Value Ref Range   Procalcitonin <0.10 ng/mL    Comment:        Interpretation: PCT (Procalcitonin) <= 0.5 ng/mL: Systemic infection (sepsis) is not likely. Local bacterial infection is possible. (NOTE)       Sepsis PCT Algorithm           Lower Respiratory Tract                                      Infection PCT Algorithm    ----------------------------     ----------------------------         PCT < 0.25 ng/mL                PCT < 0.10 ng/mL          Strongly encourage             Strongly discourage   discontinuation of antibiotics    initiation of antibiotics    ----------------------------     -----------------------------       PCT 0.25 - 0.50 ng/mL            PCT 0.10 - 0.25 ng/mL               OR       >80% decrease in PCT            Discourage initiation of                                            antibiotics      Encourage discontinuation           of antibiotics    ----------------------------     -----------------------------         PCT >= 0.50 ng/mL              PCT 0.26 - 0.50 ng/mL               AND        <80% decrease in PCT             Encourage initiation of  antibiotics       Encourage continuation           of antibiotics    ----------------------------     -----------------------------        PCT >= 0.50 ng/mL                  PCT > 0.50 ng/mL               AND         increase in PCT                  Strongly encourage                                      initiation of antibiotics    Strongly encourage escalation           of antibiotics                                     -----------------------------                                           PCT  <= 0.25 ng/mL                                                 OR                                        > 80% decrease in PCT                                      Discontinue / Do not initiate                                             antibiotics  Performed at [facility], [address] [place], [place] [address]   Glucose, capillary     Status: Abnormal   Collection Time: 05/21/21 11:59 AM  Result Value Ref Range   Glucose-Capillary 128 (H) 70 - 99 mg/dL    Comment: Glucose reference range applies only to samples taken after fasting for at least 8 hours.   MR FOOT RIGHT W WO CONTRAST  Result Date: 05/19/2021 CLINICAL DATA:  Foot pain and swelling. Open wound on the plantar aspect great toe. EXAM: MRI OF THE RIGHT FOREFOOT WITHOUT AND WITH CONTRAST TECHNIQUE: Multiplanar, multisequence MR imaging of the right foot was performed before and after the administration of intravenous contrast. CONTRAST:  6mL GADAVIST GADOBUTROL 1 MMOL/ML IV SOLN COMPARISON:  None. FINDINGS: There is a small open wound on the plantar aspect of the medial forefoot at the level of the first MTP joint. There is a small rim enhancing abscess measuring approximately 15 x 13. Associated osteomyelitis involving medial sesamoid  of the great toe. Abnormal T1 and T2 signal intensity and enhancement in the proximal phalanx of the great toe consistent with osteomyelitis. I do not see any definite findings for septic arthritis at the first MTP joint. Suspect osteomyelitis involving first metatarsal head along its plantar aspect. The other bony structures are intact. Diffuse cellulitis myofasciitis without definite findings for pyomyositis. IMPRESSION: 1. Small open wound on the plantar aspect of the medial forefoot at the level of the first MTP joint. Associated underlying soft tissue abscess. 2. Associated osteomyelitis involving the medial sesamoid of the great toe and also the proximal phalanx of the great toe. 3.  Suspect osteomyelitis involving the first metatarsal head along its plantar aspect. 4. Diffuse cellulitis and myofasciitis without definite findings pyomyositis. Electronically Signed   By: Rudie Meyer M.D.   On: 05/19/2021 14:37   DG Chest Port 1 View  Result Date: 05/21/2021 CLINICAL DATA:  Aspiration into the airway EXAM: PORTABLE CHEST 1 VIEW COMPARISON:  None. FINDINGS: Coarse reticular opacities and airways thickening present in the right lung base. The more mild reticular changes seen elsewhere in the lungs. No pneumothorax or visible effusion. Biapical pleuroparenchymal scarring. The aorta is calcified. The remaining cardiomediastinal contours are unremarkable. No acute osseous or soft tissue abnormality. Degenerative changes are present in the imaged spine and shoulders. Multiple surgical clips at base of the neck, a prior thyroidectomy. IMPRESSION: Coarse reticular changes are present in the right lung base with additional airways thickening. Could reflect sequela of aspiration in the appropriate clinical setting. The more diffusely increased interstitial markings elsewhere are nonspecific could reflect findings of an underlying interstitial disease. Aortic Atherosclerosis (ICD10-I70.0). Electronically Signed   By: Kreg Shropshire M.D.   On: 05/21/2021 05:16    Blood pressure (!) 153/82, pulse 89, temperature (!) 96.8 F (36 C), resp. rate 16, height 5\' 9"  (1.753 m), weight 59 kg, SpO2 100 %.   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 -Left plantar heel wound appears to be stable with no signs of infection present. -Right plantar first metatarsal phalangeal joint wound appears to probe deep to capsule and bone of the first metatarsal phalangeal joint concerning for potential osteomyelitis,  associated erythema and edema as well as purulent drainage. -Wound culture of right foot grew staph epidermidis.  Appreciate medicine recommendations for continued IV antibiotic therapy until surgery. -Applied Betadine wet-to-dry dressings to both feet today. -Reviewed MRI which did show a septic joint/osteomyelitis to the first metatarsal phalangeal joint -Discussed once patient becomes more oriented and once aspiration testing is cleared then could proceed with surgery consisting of right partial first ray amputation with left heel wound debridement. -We will continue to monitor patient over the weekend and follow slightly more peripherally until cleared for surgery. -Appreciate vascular recommendations.  They believe he likely has enough flow to heal any procedure at this time and recommend outpatient follow-up. -Patient ambulate in surgical shoes to both feet when cleared from medicine standpoint.  Orders placed.  , DPM 05/21/2021, 12:43 PM

## 2021-05-21 NOTE — Progress Notes (Signed)
PROGRESS NOTE    Rodney BoringCharles Lindberg Kelly  ZOX:[id number] DOB: 01-14-1924 DOA: 05/19/2021 PCP: Wilkie AyeShaner, Brian Allen, MD    Chief Complaint  Patient presents with   Wound Infection    Brief Narrative:   87 y.o. male with medical history significant of DM, HLD, PCD, GERD, CAD, MI, diabetic foot ulcer, dementia who presents with foot ulcer with infection, work-up significant for osteomyelitis, patient was seen by vascular surgery, no further work-up from their side, he has been followed by podiatry.  MRI significant for osteomyelitis, on 6/17 early a.m., patient with hypoxia, new oxygen requirement and aspiration event.  Assessment & Plan:   Principal Problem:   Diabetic foot infection (HCC) Active Problems:   Coronary artery disease   Type 2 diabetes mellitus with complication, without long-term current use of insulin (HCC)   Diabetic foot ulcers (HCC)   Hyperlipemia   GERD (gastroesophageal reflux disease)   Normocytic anemia   Cellulitis of right lower extremity   Right foot, cellulitis/mew fasciitis with abscess,  osteomyelitis right great toe and first metatarsal bone. - Patient failed outpatient oral antibiotic doxycycline treatment.  -  Patient has tachycardia, but no fever, tachypnea or leukocytosis.  Clinically not septic.  Lactic acid normal 1.2. Dr. Excell SeltzerBaker of podiatry and Dr. Wyn Quakerew of [facility] are consulted. -Continue with IV vancomycin for osteomyelitis coverage - Vascular surgery input greatly appreciated, no further work-up currently, outpatient follow-up. -No evidence of DVT on venous Dopplers -Podiatry input greatly appreciated, plan for right partial first ray amputation with left heel wound debridement, I have discussed with podiatry, surgery will be postponed till after the weekend and improvement in delirium and aspiration.  Acute toxic encephalopathy/hospital delirium -Continue with as needed Haldol, patient has a sitter, have discussed with son, encouraged him to  have him or family member stay next to patient to have familiar faces, will try to minimize narcotics.  Aspiration pneumonia -Chest x-ray significant for right lung base opacity, given his allergies I will add cefepime/Flagyl to vancomycin. -SLP consult when more awake and appropriate.   Coronary artery disease:  -No CP. S/p of stent per his son -ASA, tricor, Imdur -prn NGT  Urinary retention -Foley catheter inserted 6/17, 450 cc urine output, started on Flomax.   Type 2 diabetes mellitus with complication of foot ulcer, without long-term current use of insulin (HCC): Recent A1c 6.4, well controlled.  Patient taking metformin at home -Sliding scale insulin   Hyperlipemia -Fenofibrate -Patient is not taking Lipitor currently   GERD (gastroesophageal reflux disease) -Protonix   Normocytic anemia:  -Plan, this is most likely anemia of chronic disease      DVT prophylaxis: Heparin Code Status: Full Family Communication: Discussed with son at bedside Disposition:   Status is: Inpatient  Remains inpatient appropriate because:IV treatments appropriate due to intensity of illness or inability to take PO  Dispo: The patient is from: Home              Anticipated d/c is to: Home              Patient currently is not medically stable to d/c.   Difficult to place patient No       Consultants:  Podiatry Vascular surgery  Subjective:  No significant events overnight as discussed with staff, patient himself denies any complaints.  Objective: Vitals:   05/21/21 0428 05/21/21 0511 05/21/21 0811 05/21/21 1129  BP:   137/82 (!) 153/82  Pulse: 75 78 80 89  Resp:   16 16  Temp: (!) 97.5 F (36.4 C)  (!) 96.7 F (35.9 C) (!) 96.8 F (36 C)  TempSrc: Axillary     SpO2: 90% 94% 99% 100%  Weight:      Height:       No intake or output data in the 24 hours ending 05/21/21 1425  Filed Weights   05/19/21 0722  Weight: 59 kg    Examination:  Confused, lethargic  today, moaning incoherently  Symmetrical Chest wall movement, diminished air entry at the bases with scattered rales RRR,No Gallops,Rubs or new Murmurs, No Parasternal Heave +ve B.Sounds, Abd Soft, No tenderness, No rebound - guarding or rigidity. No Cyanosis, both feet warm to touch, erythema and right foot has significantly improved      Data Reviewed: I have personally reviewed following labs and imaging studies  CBC: Recent Labs  Lab 05/19/21 0728 05/20/21 0416 05/21/21 0930  WBC 9.1 7.5 10.2  NEUTROABS 6.8  --   --   HGB 10.5* 9.7* 10.4*  HCT 32.7* 29.6* 32.1*  MCV 88.9 86.0 87.7  PLT 323 281 337    Basic Metabolic Panel: Recent Labs  Lab 05/19/21 0728 05/21/21 0404 05/21/21 0930  NA 139  --  136  K 3.8  --  3.6  CL 102  --  103  CO2 27  --  27  GLUCOSE 200*  --  144*  BUN 18  --  15  CREATININE 0.80 0.63 0.55*  CALCIUM 9.5  --  9.2    GFR: Estimated Creatinine Clearance: 47.1 mL/min (A) (by C-G formula based on SCr of 0.55 mg/dL (L)).  Liver Function Tests: Recent Labs  Lab 05/19/21 0728  AST 12*  ALT 12  ALKPHOS 74  BILITOT 0.6  PROT 7.3  ALBUMIN 3.1*    CBG: Recent Labs  Lab 05/20/21 1129 05/20/21 1601 05/20/21 2247 05/21/21 0828 05/21/21 1159  GLUCAP 202* 126* 104* 145* 128*     Recent Results (from the past 240 hour(s))  Blood Cultures x 2 sites     Status: None (Preliminary result)   Collection Time: 05/19/21  9:35 AM   Specimen: BLOOD  Result Value Ref Range Status   Specimen Description BLOOD LEFT AC  Final   Special Requests   Final    BOTTLES DRAWN AEROBIC AND ANAEROBIC Blood Culture results may not be optimal due to an excessive volume of blood received in culture bottles   Culture   Final    NO GROWTH < 24 HOURS Performed at [facility], [address] [place], [place] [address]    Report Status PENDING  Incomplete  Blood Cultures x 2 sites     Status: None (Preliminary result)   Collection Time: 05/19/21   9:35 AM   Specimen: BLOOD  Result Value Ref Range Status   Specimen Description BLOOD  RIGHT [facility]  Final   Special Requests   Final    BOTTLES DRAWN AEROBIC AND ANAEROBIC Blood Culture adequate volume   Culture   Final    NO GROWTH < 24 HOURS Performed at [facility], [address] [place], [place] [address]    Report Status PENDING  Incomplete  SARS CORONAVIRUS 2 (TAT 6-24 HRS) Nasopharyngeal Nasopharyngeal Swab     Status: None   Collection Time: 05/19/21  9:35 AM   Specimen: Nasopharyngeal Swab  Result Value Ref Range Status   SARS Coronavirus 2 NEGATIVE NEGATIVE Final    Comment: (NOTE) SARS-CoV-2 target nucleic acids are NOT DETECTED.  The SARS-CoV-2 RNA is generally detectable in upper and lower respiratory specimens during the acute phase of infection. Negative results do not preclude SARS-CoV-2 infection, do not rule out co-infections with other pathogens, and should not be used as the sole basis for treatment or other patient management decisions. Negative results must be combined with clinical observations, patient history, and epidemiological information. The expected result is Negative.  Fact Sheet for Patients: HairSlick.no  Fact Sheet for Healthcare Providers: quierodirigir.com  This test is not yet approved or cleared by the [place] FDA and  has been authorized for detection and/or diagnosis of SARS-CoV-2 by FDA under an Emergency Use Authorization (EUA). This EUA will remain  in effect (meaning this test can be used) for the duration of the COVID-19 declaration under Se ction 564(b)(1) of the Act, 21 U.S.C. section 360bbb-3(b)(1), unless the authorization is terminated or revoked sooner.  Performed at [facility], [address] [place], [place] [address]   Aerobic/Anaerobic Culture w Gram Stain (surgical/deep wound)     Status: None (Preliminary result)   Collection Time:  05/19/21 12:36 PM   Specimen: Wound  Result Value Ref Range Status   Specimen Description   Final    WOUND Performed at [facility], [address], [place], [place] [address]    Special Requests   Final    NONE Performed at [facility], [address] [place], [place] [address]    Gram Stain   Final    RARE WBC PRESENT, PREDOMINANTLY MONONUCLEAR RARE GRAM POSITIVE COCCI Performed at [facility], [address], [place], [place] [address]    Culture   Final    RARE STAPHYLOCOCCUS EPIDERMIDIS NO ANAEROBES ISOLATED; CULTURE IN PROGRESS FOR 5 DAYS    Report Status PENDING  Incomplete   Organism ID, Bacteria STAPHYLOCOCCUS EPIDERMIDIS  Final      Susceptibility   Staphylococcus epidermidis - MIC*    CIPROFLOXACIN <=0.5 SENSITIVE Sensitive     ERYTHROMYCIN >=8 RESISTANT Resistant     GENTAMICIN <=0.5 SENSITIVE Sensitive     OXACILLIN <=0.25 SENSITIVE Sensitive     TETRACYCLINE >=16 RESISTANT Resistant     VANCOMYCIN 1 SENSITIVE Sensitive     TRIMETH/SULFA <=10 SENSITIVE Sensitive     CLINDAMYCIN <=0.25 SENSITIVE Sensitive     RIFAMPIN <=0.5 SENSITIVE Sensitive     Inducible Clindamycin NEGATIVE Sensitive     * RARE STAPHYLOCOCCUS EPIDERMIDIS  MRSA PCR Screening     Status: None   Collection Time: 05/20/21  3:50 PM  Result Value Ref Range Status   MRSA by PCR NEGATIVE NEGATIVE Final    Comment:        The GeneXpert MRSA Assay (FDA approved for NASAL specimens only), is one component of a comprehensive MRSA colonization surveillance program. It is not intended to diagnose MRSA infection nor to guide or monitor treatment for MRSA infections. Performed at [facility], [address], [place], [place] [address]          Radiology Studies: [facility] Chest [place] 1 View  Result Date: 05/21/2021 CLINICAL DATA:  Aspiration into the airway EXAM: PORTABLE CHEST 1 VIEW COMPARISON:  None. FINDINGS: Coarse reticular opacities and airways  thickening present in the right lung base. The more mild reticular changes seen elsewhere in the lungs. No pneumothorax or visible effusion. Biapical pleuroparenchymal scarring. The aorta is calcified. The remaining cardiomediastinal contours are unremarkable. No acute osseous or soft tissue abnormality. Degenerative changes are present in  the imaged spine and shoulders. Multiple surgical clips at base of the neck, a prior thyroidectomy. IMPRESSION: Coarse reticular changes are present in the right lung base with additional airways thickening. Could reflect sequela of aspiration in the appropriate clinical setting. The more diffusely increased interstitial markings elsewhere are nonspecific could reflect findings of an underlying interstitial disease. Aortic Atherosclerosis (ICD10-I70.0). Electronically Signed   By: Kreg Shropshire M.D.   On: 05/21/2021 05:16        Scheduled Meds:  aspirin  81 mg Oral Daily   chlorhexidine  60 mL Topical Once   fenofibrate  160 mg Oral Daily   heparin  5,000 Units Subcutaneous Q8H   insulin aspart  0-5 Units Subcutaneous QHS   insulin aspart  0-9 Units Subcutaneous TID WC   isosorbide mononitrate  30 mg Oral Daily   mupirocin ointment   Topical Daily   pantoprazole  40 mg Oral Daily   pregabalin  75 mg Oral BID   sodium chloride flush  10 mL Intravenous Q12H   Continuous Infusions:  ceFEPime (MAXIPIME) IV 2 g (05/21/21 1127)   metronidazole 500 mg (05/21/21 1022)   vancomycin 1,000 mg (05/21/21 1243)     LOS: 2 days       Huey Bienenstock, MD [facility]   To contact the attending provider between 7A-7P or the covering provider during after hours 7P-7A, please log into the web site [email or web address] and access using universal [place] password for that web site. If you do not have the password, please call the hospital operator.  05/21/2021, 2:25 PM

## 2021-05-22 ENCOUNTER — Inpatient Hospital Stay: Payer: Medicare PPO

## 2021-05-22 LAB — CBC
HCT: 29.6 % — ABNORMAL LOW (ref 39.0–52.0)
Hemoglobin: 9.5 g/dL — ABNORMAL LOW (ref 13.0–17.0)
MCH: 27.9 pg (ref 26.0–34.0)
MCHC: 32.1 g/dL (ref 30.0–36.0)
MCV: 87.1 fL (ref 80.0–100.0)
Platelets: 333 10*3/uL (ref 150–400)
RBC: 3.4 MIL/uL — ABNORMAL LOW (ref 4.22–5.81)
RDW: 14.7 % (ref 11.5–15.5)
WBC: 8.1 10*3/uL (ref 4.0–10.5)
nRBC: 0 % (ref 0.0–0.2)

## 2021-05-22 LAB — GLUCOSE, CAPILLARY
Glucose-Capillary: 126 mg/dL — ABNORMAL HIGH (ref 70–99)
Glucose-Capillary: 124 mg/dL — ABNORMAL HIGH (ref 70–99)
Glucose-Capillary: 126 mg/dL — ABNORMAL HIGH (ref 70–99)
Glucose-Capillary: 123 mg/dL — ABNORMAL HIGH (ref 70–99)

## 2021-05-22 LAB — BASIC METABOLIC PANEL
Anion gap: 6 (ref 5–15)
BUN: 12 mg/dL (ref 8–23)
CO2: 24 mmol/L (ref 22–32)
Calcium: 9.1 mg/dL (ref 8.9–10.3)
Chloride: 107 mmol/L (ref 98–111)
Creatinine, Ser: 0.59 mg/dL — ABNORMAL LOW (ref 0.61–1.24)
GFR, Estimated: >60 mL/min (ref >60)
Glucose, Bld: 126 mg/dL — ABNORMAL HIGH (ref 70–99)
Potassium: 3.4 mmol/L — ABNORMAL LOW (ref 3.5–5.1)
Sodium: 137 mmol/L (ref 135–145)

## 2021-05-22 LAB — PROCALCITONIN: Procalcitonin: <0.1 ng/mL

## 2021-05-22 MED ORDER — METOPROLOL TARTRATE 5 MG/5ML IV SOLN
5.0000 mg | Freq: Once | INTRAVENOUS | Status: AC
  Administered 2021-05-23: 5 mg via INTRAVENOUS
  Filled 2021-05-22: qty 5

## 2021-05-22 MED ORDER — POTASSIUM CHLORIDE 10 MEQ/100ML IV SOLN
10.0000 meq | INTRAVENOUS | Status: AC
  Administered 2021-05-22 (×2): 10 meq via INTRAVENOUS
  Filled 2021-05-22 (×2): qty 100

## 2021-05-22 NOTE — Progress Notes (Signed)
Pt again startes getting aggressive around 6pm, trying to get out of bed. Gave 1mg  of haldol. No change yet. Disoriented x 4 kicking and pushing. Will continue to monitor until shift change

## 2021-05-22 NOTE — Progress Notes (Signed)
PODIATRY / FOOT AND ANKLE SURGERY PROGRESS NOTE  Requesting Physician: Dr. Clyde Lundborg  Reason for consult: Foot wounds, infection right foot  Chief Complaint: Right foot wound infection   HPI: Rodney Kelly is a 87 y.o. male who presents today sitting up resting in a chair.  Patient appears to be more closer to his baseline state today that he arrived in.  Patient was noted to have aspiration a few days ago which delayed his R 1st partial ray amputation procedure.  Still awaiting clearance for surgery from internal medicine standpoint after patient has stabilized.    PMHx:  Past Medical History:  Diagnosis Date   Anginal pain (HCC)    Arthritis    Complication of anesthesia    spinal anesthesia caused nerve damage in 2002   Coronary artery disease    Diabetes mellitus without complication (HCC)    md stopped metformin   Diabetic ulcer of left foot (HCC)    GERD (gastroesophageal reflux disease)    Hyperlipemia    Multiple thyroid nodules    Myocardial infarction (HCC)    STEMI   Osteoporosis    Peripheral nerve disease    Ulceration of colon    NSAIDS   Vitamin D deficiency     Surgical Hx:  Past Surgical History:  Procedure Laterality Date   ABDOMINAL AORTIC ANEURYSM REPAIR  2013   ACHILLES TENDON SURGERY Left 09/28/2018   Procedure: ACHILLES TENDON MICROTENOTOMY-TAL;  Surgeon: Gwyneth Revels, DPM;  Location: [facility] OR[facility];  Service: Podiatry;  Laterality: Left;   CARPAL TUNNEL RELEASE Right    excision of benign skin lesion     JOINT REPLACEMENT     right knee   SKIN LESION EXCISION     thyroid lobectomy Right    WOUND DEBRIDEMENT Left 09/28/2018   Procedure: DEBRIDEMENT WOUND-SKIN,SUBCUTANEOUS TISSUE;  Surgeon: Gwyneth Revels, DPM;  Location: [facility];  Service: Podiatry;  Laterality: Left;    FHx: No family history on file.  Social History:  reports that he quit smoking about 29 years ago. His smoking use included cigarettes. He has never used smokeless tobacco.  He reports previous alcohol use. He reports that he does not use drugs.  Allergies:  Allergies  Allergen Reactions   Penicillins Other (See Comments)    unknown   Sulfa Antibiotics Other (See Comments)    unknown    Medications Prior to Admission  Medication Sig Dispense Refill   acetaminophen (TYLENOL) 500 MG tablet Take 1,000 mg by mouth every 8 (eight) hours as needed for moderate pain.     aspirin 81 MG chewable tablet Chew 81 mg by mouth daily.     fenofibrate (TRICOR) 145 MG tablet Take 145 mg by mouth every evening.     fenofibrate (TRICOR) 145 MG tablet      isosorbide mononitrate (IMDUR) 30 MG 24 hr tablet Take by mouth.     metFORMIN (GLUCOPHAGE) 1000 MG tablet Take 1,000 mg by mouth 2 (two) times daily with a meal.     omeprazole (PRILOSEC) 40 MG capsule Take 40 mg by mouth daily.     pregabalin (LYRICA) 75 MG capsule Take 75 mg by mouth 2 (two) times daily.      albuterol (PROVENTIL HFA;VENTOLIN HFA) 108 (90 Base) MCG/ACT inhaler Inhale 2 puffs into the lungs every 6 (six) hours as needed for wheezing or shortness of breath. (Patient not taking: No sig reported)     alendronate (FOSAMAX) 70 MG tablet Take 70 mg by mouth once  a week. Take with a full glass of water on an empty stomach. (Patient not taking: No sig reported)     atorvastatin (LIPITOR) 20 MG tablet Take 20 mg by mouth daily. (Patient not taking: No sig reported)     HYDROcodone-acetaminophen (NORCO) 5-325 MG tablet Take 1 tablet by mouth every 6 (six) hours as needed for moderate pain. (Patient not taking: No sig reported) 15 tablet 0   nitroGLYCERIN (NITROSTAT) 0.4 MG SL tablet Place 0.4 mg under the tongue every 5 (five) minutes as needed for chest pain.     sertraline (ZOLOFT) 50 MG tablet Take 50 mg by mouth daily. (Patient not taking: No sig reported)       Physical Exam: Vascular: DP/PT pulses +1 bilateral, capillary fill time appears to be intact to digits bilateral.  Both feet appear to be warm to touch  right greater than left.  No hair growth noted to digits to bilateral lower extremities.  Mild edema and erythema to the right forefoot and midfoot, greatly improved overall since admission.  Neuro: Light touch sensation absent to bilateral lower extremities.  Derm: Ulceration present to the plantar aspect of the left heel.  Appears to be stable but does probe to the deep subcutaneous tissue, no bone exposed.  No associated erythema or edema, minimal serous drainage.  Measures approximately 0.1 x 0.1 x 0.4 cm  Right plantar first metatarsal phalangeal joint ulceration measures approximately 0.3 x 0.2 x 0.6 cm and probes deep very close to the sesamoids and first metatarsal phalangeal joint to hard stop concerning for depth of wound to bone.  Associated purulent drainage as well as erythema and edema that extends from the ulceration to the midfoot.  MSK: 4/5 strength bilateral lower extremities.  Results for orders placed or performed during the hospital encounter of 05/19/21 (from the past 48 hour(s))  Glucose, capillary     Status: Abnormal   Collection Time: 05/20/21 11:29 AM  Result Value Ref Range   Glucose-Capillary 202 (H) 70 - 99 mg/dL    Comment: Glucose reference range applies only to samples taken after fasting for at least 8 hours.  MRSA PCR Screening     Status: None   Collection Time: 05/20/21  3:50 PM  Result Value Ref Range   MRSA by PCR NEGATIVE NEGATIVE    Comment:        The GeneXpert MRSA Assay (FDA approved for NASAL specimens only), is one component of a comprehensive MRSA colonization surveillance program. It is not intended to diagnose MRSA infection nor to guide or monitor treatment for MRSA infections. Performed at [facility], [address] [place], [place] [address]   Glucose, capillary     Status: Abnormal   Collection Time: 05/20/21  4:01 PM  Result Value Ref Range   Glucose-Capillary 126 (H) 70 - 99 mg/dL    Comment: Glucose reference  range applies only to samples taken after fasting for at least 8 hours.  Glucose, capillary     Status: Abnormal   Collection Time: 05/20/21 10:47 PM  Result Value Ref Range   Glucose-Capillary 104 (H) 70 - 99 mg/dL    Comment: Glucose reference range applies only to samples taken after fasting for at least 8 hours.   Comment 1 Notify RN   Creatinine, serum     Status: None   Collection Time: 05/21/21  4:04 AM  Result Value Ref Range   Creatinine, Ser 0.63 0.61 - 1.24 mg/dL   GFR, Estimated >  60 >60 mL/min    Comment: (NOTE) Calculated using the CKD-EPI Creatinine Equation (2021) Performed at [facility], [address] [place], [place] [address]   Glucose, capillary     Status: Abnormal   Collection Time: 05/21/21  8:28 AM  Result Value Ref Range   Glucose-Capillary 145 (H) 70 - 99 mg/dL    Comment: Glucose reference range applies only to samples taken after fasting for at least 8 hours.  CBC     Status: Abnormal   Collection Time: 05/21/21  9:30 AM  Result Value Ref Range   WBC 10.2 4.0 - 10.5 K/uL   RBC 3.66 (L) 4.22 - 5.81 MIL/uL   Hemoglobin 10.4 (L) 13.0 - 17.0 g/dL   HCT 60.432.1 (L) 54.039.0 - 98.152.0 %   MCV 87.7 80.0 - 100.0 fL   MCH 28.4 26.0 - 34.0 pg   MCHC 32.4 30.0 - 36.0 g/dL   RDW 19.114.7 47.811.5 - 29.515.5 %   Platelets 337 150 - 400 K/uL   nRBC 0.0 0.0 - 0.2 %    Comment: Performed at [facility], [address] [place], [place] [address]  Basic metabolic panel     Status: Abnormal   Collection Time: 05/21/21  9:30 AM  Result Value Ref Range   Sodium 136 135 - 145 mmol/L   Potassium 3.6 3.5 - 5.1 mmol/L   Chloride 103 98 - 111 mmol/L   CO2 27 22 - 32 mmol/L   Glucose, Bld 144 (H) 70 - 99 mg/dL    Comment: Glucose reference range applies only to samples taken after fasting for at least 8 hours.   BUN 15 8 - 23 mg/dL   Creatinine, Ser 8.650.55 (L) 0.61 - 1.24 mg/dL   Calcium 9.2 8.9 - 78.410.3 mg/dL   GFR, Estimated >69>60 >62>60 mL/min    Comment:  (NOTE) Calculated using the CKD-EPI Creatinine Equation (2021)    Anion gap 6 5 - 15    Comment: Performed at [facility], [address] [place], [place] [address]  Procalcitonin - Baseline     Status: None   Collection Time: 05/21/21  9:30 AM  Result Value Ref Range   Procalcitonin <0.10 ng/mL    Comment:        Interpretation: PCT (Procalcitonin) <= 0.5 ng/mL: Systemic infection (sepsis) is not likely. Local bacterial infection is possible. (NOTE)       Sepsis PCT Algorithm           Lower Respiratory Tract                                      Infection PCT Algorithm    ----------------------------     ----------------------------         PCT < 0.25 ng/mL                PCT < 0.10 ng/mL          Strongly encourage             Strongly discourage   discontinuation of antibiotics    initiation of antibiotics    ----------------------------     -----------------------------       PCT 0.25 - 0.50 ng/mL            PCT 0.10 - 0.25 ng/mL               OR       >  80% decrease in PCT            Discourage initiation of                                            antibiotics      Encourage discontinuation           of antibiotics    ----------------------------     -----------------------------         PCT >= 0.50 ng/mL              PCT 0.26 - 0.50 ng/mL               AND        <80% decrease in PCT             Encourage initiation of                                             antibiotics       Encourage continuation           of antibiotics    ----------------------------     -----------------------------        PCT >= 0.50 ng/mL                  PCT > 0.50 ng/mL               AND         increase in PCT                  Strongly encourage                                      initiation of antibiotics    Strongly encourage escalation           of antibiotics                                     -----------------------------                                           PCT  <= 0.25 ng/mL                                                 OR                                        > 80% decrease in PCT                                      Discontinue / Do not initiate  antibiotics  Performed at [facility], [address] [place], [place] [address]   Glucose, capillary     Status: Abnormal   Collection Time: 05/21/21 11:59 AM  Result Value Ref Range   Glucose-Capillary 128 (H) 70 - 99 mg/dL    Comment: Glucose reference range applies only to samples taken after fasting for at least 8 hours.  Glucose, capillary     Status: Abnormal   Collection Time: 05/21/21  4:26 PM  Result Value Ref Range   Glucose-Capillary 146 (H) 70 - 99 mg/dL    Comment: Glucose reference range applies only to samples taken after fasting for at least 8 hours.  Glucose, capillary     Status: Abnormal   Collection Time: 05/21/21  9:49 PM  Result Value Ref Range   Glucose-Capillary 140 (H) 70 - 99 mg/dL    Comment: Glucose reference range applies only to samples taken after fasting for at least 8 hours.  CBC     Status: Abnormal   Collection Time: 05/22/21  4:56 AM  Result Value Ref Range   WBC 8.1 4.0 - 10.5 K/uL   RBC 3.40 (L) 4.22 - 5.81 MIL/uL   Hemoglobin 9.5 (L) 13.0 - 17.0 g/dL   HCT 50.5 (L) 39.7 - 67.3 %   MCV 87.1 80.0 - 100.0 fL   MCH 27.9 26.0 - 34.0 pg   MCHC 32.1 30.0 - 36.0 g/dL   RDW 41.9 37.9 - 02.4 %   Platelets 333 150 - 400 K/uL   nRBC 0.0 0.0 - 0.2 %    Comment: Performed at [facility], [address], [place], [place] [address]  Basic metabolic panel     Status: Abnormal   Collection Time: 05/22/21  4:56 AM  Result Value Ref Range   Sodium 137 135 - 145 mmol/L   Potassium 3.4 (L) 3.5 - 5.1 mmol/L   Chloride 107 98 - 111 mmol/L   CO2 24 22 - 32 mmol/L   Glucose, Bld 126 (H) 70 - 99 mg/dL    Comment: Glucose reference range applies only to samples taken after fasting for at least 8 hours.    BUN 12 8 - 23 mg/dL   Creatinine, Ser 3.29 (L) 0.61 - 1.24 mg/dL   Calcium 9.1 8.9 - 92.4 mg/dL   GFR, Estimated >26 >83 mL/min    Comment: (NOTE) Calculated using the CKD-EPI Creatinine Equation (2021)    Anion gap 6 5 - 15    Comment: Performed at [facility], [address] [place], [place] [address]  Procalcitonin     Status: None   Collection Time: 05/22/21  4:56 AM  Result Value Ref Range   Procalcitonin <0.10 ng/mL    Comment:        Interpretation: PCT (Procalcitonin) <= 0.5 ng/mL: Systemic infection (sepsis) is not likely. Local bacterial infection is possible. (NOTE)       Sepsis PCT Algorithm           Lower Respiratory Tract                                      Infection PCT Algorithm    ----------------------------     ----------------------------         PCT < 0.25 ng/mL                PCT < 0.10 ng/mL  Strongly encourage             Strongly discourage   discontinuation of antibiotics    initiation of antibiotics    ----------------------------     -----------------------------       PCT 0.25 - 0.50 ng/mL            PCT 0.10 - 0.25 ng/mL               OR       >80% decrease in PCT            Discourage initiation of                                            antibiotics      Encourage discontinuation           of antibiotics    ----------------------------     -----------------------------         PCT >= 0.50 ng/mL              PCT 0.26 - 0.50 ng/mL               AND        <80% decrease in PCT             Encourage initiation of                                             antibiotics       Encourage continuation           of antibiotics    ----------------------------     -----------------------------        PCT >= 0.50 ng/mL                  PCT > 0.50 ng/mL               AND         increase in PCT                  Strongly encourage                                      initiation of antibiotics    Strongly encourage escalation            of antibiotics                                     -----------------------------                                           PCT <= 0.25 ng/mL                                                 OR                                        >  80% decrease in PCT                                      Discontinue / Do not initiate                                             antibiotics  Performed at [facility], [address] [place], [place] [address]   Glucose, capillary     Status: Abnormal   Collection Time: 05/22/21  7:47 AM  Result Value Ref Range   Glucose-Capillary 126 (H) 70 - 99 mg/dL    Comment: Glucose reference range applies only to samples taken after fasting for at least 8 hours.   DG Chest Port 1 View  Result Date: 05/21/2021 CLINICAL DATA:  Aspiration into the airway EXAM: PORTABLE CHEST 1 VIEW COMPARISON:  None. FINDINGS: Coarse reticular opacities and airways thickening present in the right lung base. The more mild reticular changes seen elsewhere in the lungs. No pneumothorax or visible effusion. Biapical pleuroparenchymal scarring. The aorta is calcified. The remaining cardiomediastinal contours are unremarkable. No acute osseous or soft tissue abnormality. Degenerative changes are present in the imaged spine and shoulders. Multiple surgical clips at base of the neck, a prior thyroidectomy. IMPRESSION: Coarse reticular changes are present in the right lung base with additional airways thickening. Could reflect sequela of aspiration in the appropriate clinical setting. The more diffusely increased interstitial markings elsewhere are nonspecific could reflect findings of an underlying interstitial disease. Aortic Atherosclerosis (ICD10-I70.0). Electronically Signed   By: Kreg Shropshire M.D.   On: 05/21/2021 05:16    Blood pressure 129/65, pulse 80, temperature 97.6 F (36.4 C), resp. rate 14, height  (1.753 m), weight 59 kg, SpO2 100 %.   Assessment Right foot  osteomyelitis first metatarsal phalangeal joint with associated open wound and cellulitis with purulent drainage/abscess Diabetes type 2 polyneuropathy PVD Left plantar heel wound, stable  Plan -Patient seen and examined. -Left plantar heel wound appears to be stable with no signs of infection present. -Right plantar first metatarsal phalangeal joint wound appears to probe deep to capsule and bone of the first metatarsal phalangeal joint concerning for potential osteomyelitis, associated erythema and edema as well as purulent drainage. -Wound culture of right foot grew staph epidermidis.  Appreciate medicine recommendations for continued IV antibiotic therapy until surgery. -Applied Betadine wet-to-dry dressings to both feet today. -Reviewed MRI which did show a septic joint/osteomyelitis to the first metatarsal phalangeal joint -Discussed once patient becomes more oriented and once aspiration testing is cleared then could proceed with surgery consisting of right partial first ray amputation with left heel wound debridement. -We will continue to monitor patient over the weekend and follow slightly more peripherally until cleared for surgery. -Appreciate vascular recommendations.  They believe he likely has enough flow to heal any procedure at this time and recommend outpatient follow-up. -Patient ambulate in surgical shoes to both feet when cleared from medicine standpoint.  Orders placed.  Rosetta Posner, DPM 05/22/2021, 9:44 AM

## 2021-05-22 NOTE — Progress Notes (Signed)
Pt having a slightly better DAY. WHEN THE SON LEFT WE GOT PATIENT UP TO CHAIR HIS MOBILITY IN 2 DAYS HAS GREATLY DECLINED. HOWEVER HE DID TOLERATE THE CHAIR WELL. MAX ASSIST X 2 AT THIS POINT. WILL MONITOR PATIENT ALWAYS TRYING TO GO HOME WHEN THE SON IS HERE AND THEY END UP YELLING AND FUSSING WITH EACH OTHER. WHEEZING IS NOT DETECTED TODAY AND WAS VERY LOUD YESTERDAY. STILL MAINTAINING AT 2 LITERS AND YESTERDAY IT WAS 4. PT WAS PUT BACK TO BED AND WAS UNABLE TO STAND AT WHICH TIME MYSELF AND Juliet PICKED HIM UP AND PUT HIM IN THE BED SO CT COULD TAKE HIM FOR A BRAIN SCAN THAT REPORTEDLY STATES NO NEW DEFICITS. WILL CONTINUE TO MONITOR

## 2021-05-22 NOTE — Evaluation (Addendum)
Clinical/Bedside Swallow Evaluation Patient Details  Name: Rodney Kelly MRN: [id number] Date of Birth: 02/06/1924  Today's Date: 05/22/2021 Time: SLP Start Time (ACUTE ONLY): 0820 SLP Stop Time (ACUTE ONLY): 0915 SLP Time Calculation (min) (ACUTE ONLY): 55 min  Past Medical History:  Past Medical History:  Diagnosis Date   Anginal pain (HCC)    Arthritis    Complication of anesthesia    spinal anesthesia caused nerve damage in 2002   Coronary artery disease    Diabetes mellitus without complication (HCC)    md stopped metformin   Diabetic ulcer of left foot (HCC)    GERD (gastroesophageal reflux disease)    Hyperlipemia    Multiple thyroid nodules    Myocardial infarction (HCC)    STEMI   Osteoporosis    Peripheral nerve disease    Ulceration of colon    NSAIDS   Vitamin D deficiency    Past Surgical History:  Past Surgical History:  Procedure Laterality Date   ABDOMINAL AORTIC ANEURYSM REPAIR  2013   ACHILLES TENDON SURGERY Left 09/28/2018   Procedure: ACHILLES TENDON MICROTENOTOMY-TAL;  Surgeon: Gwyneth Revels, DPM;  Location: [facility] ORS;  Service: Podiatry;  Laterality: Left;   CARPAL TUNNEL RELEASE Right    excision of benign skin lesion     JOINT REPLACEMENT     right knee   SKIN LESION EXCISION     thyroid lobectomy Right    WOUND DEBRIDEMENT Left 09/28/2018   Procedure: DEBRIDEMENT WOUND-SKIN,SUBCUTANEOUS TISSUE;  Surgeon: Gwyneth Revels, DPM;  Location: [facility] ORS;  Service: Podiatry;  Laterality: Left;   HPI:  Pt is a 87 y.o. male with medical history significant of DM, HLD, PCD, GERD, CAD, MI, diabetic foot ulcer, advanced Dementia who presents with foot ulcer with infection.  Patient has chronic bilateral foot ulcers.  Patient has been followed up with Dr. Ether Griffins of podiatry. Per chart notes, Pt scheduled for first ray amputation and likely debridement of the ulcer on the left heel, per podiatry.  CXR: Coarse reticular changes are present in the right  lung base with  additional airways thickening. Could reflect sequela of aspiration  in the appropriate clinical setting.  The more diffusely increased interstitial markings elsewhere are  nonspecific could reflect findings of an underlying interstitial  disease.   Assessment / Plan / Recommendation Clinical Impression  Pt appears to present w/ oropharyngeal phase dysphagia in light of Significantly declined Cognitive status; advanced Dementia at his Baseline per chart. Pt is also exhibiting Mod-Severe increased upper airway congestion w/ mucousy, wet vocal quality at Rest PRIOR TO any po's -- minimal attempts to cough/clear this. Decreased awareness and attention to self w/ advanced Cognitive decline impacts his overall awareness/engagement and safety during po tasks which increases risk for aspiration, choking. Pt's risk for aspiration, as well as meeting nutritional needs fully, is present at this time. It was difficult to discern pt's vocal quality and effectiveness of the pharygneal swallow w/ the amount of laryngeal-pharyngeal Phlegm Baseline. He required Mod tactile/verbal/ visual cues for orientation to bolus presentation, and during feeding support of the trials.   Pt consumed trials of ice chip, purees, and TSP sips of Nectar liquids. Overt coughing (delayed/immediate) noted w/ all consistencies. Suspect delayed pharyngeal swallow initiation, and impact from the Phlegm. Also noted reduced laryngeal excursion and multiple swallows during each trial. Oral phase was c/b prolonged bolus management and min oral holding w/ each trial consistency. Oral clearing given Time. OM Exam was cursory but No unilateral weakness  noted. Speech muttered/mubled but clear at times -- Phlegmy quality to phonations/verbalizations.    D/t pt's declined Cognitive status, and his risk for aspiration, recommend strict NPO status w/ frequent oral care for hygiene and stimulation of swallowing. Son and Sitter/NSG educated on  use of swabs for oral care. ST Services will f/u on Monday for continued assessment of swallow function and safety for initiation of oral diet; objective assessment if indicated. Recommend meds via alternative means at this time.    ST services recommends follow w/ Palliative Care for GOC at discharge to discuss the impact of Dementia and swallowing w/ family, pt. Largely suspect that pt's advanced Dementia could hamper safety w/ oral intake, meeting hydration needs, and upgrade of diet. SLP Visit Diagnosis: Dysphagia, oropharyngeal phase (R13.12) (Cognitive decline d/t Dementia)    Aspiration Risk  Moderate aspiration risk;Severe aspiration risk;Risk for inadequate nutrition/hydration    Diet Recommendation  NPO including meds; frequent oral care for hygiene and stimulation of swallowing  Medication Administration: Via alternative means    Other  Recommendations Recommended Consults:  (Palliative Care consult) Oral Care Recommendations: Oral care QID;Staff/trained caregiver to provide oral care Other Recommendations:  (TBD)   Follow up Recommendations  (TBD)      Frequency and Duration min 3x week  2 weeks       Prognosis Prognosis for Safe Diet Advancement: Guarded Barriers to Reach Goals: Cognitive deficits;Time post onset;Severity of deficits Barriers/Prognosis Comment: advanced Dementia      Swallow Study   General Date of Onset: 05/19/21 HPI: Pt is a 87 y.o. male with medical history significant of DM, HLD, PCD, GERD, CAD, MI, diabetic foot ulcer, dementia who presents with foot ulcer with infection.  Patient has chronic bilateral foot ulcers.  Patient has been followed up with Dr. Ether Griffins of podiatry. Per chart notes, Pt scheduled for first ray amputation and likely debridement of the ulcer on the left heel, per podiatry.  CXR: Coarse reticular changes are present in the right lung base with  additional airways thickening. Could reflect sequela of aspiration  in the appropriate  clinical setting.  The more diffusely increased interstitial markings elsewhere are  nonspecific could reflect findings of an underlying interstitial  disease. Type of Study: Bedside Swallow Evaluation Previous Swallow Assessment: none Diet Prior to this Study: NPO (regular diet previously) Temperature Spikes Noted: No (wbc 8.1) Respiratory Status: Nasal cannula (2L) History of Recent Intubation: No Behavior/Cognition: Alert;Cooperative;Pleasant mood;Confused;Distractible;Requires cueing Oral Cavity Assessment: Within Functional Limits Oral Care Completed by SLP: Yes Oral Cavity - Dentition: Adequate natural dentition (mssing few only) Vision: Functional for self-feeding Self-Feeding Abilities: Needs assist;Needs set up;Total assist Patient Positioning: Upright in bed (needed support) Baseline Vocal Quality:  (gurgly, wet) Volitional Cough: Strong;Congested Volitional Swallow: Unable to elicit    Oral/Motor/Sensory Function Overall Oral Motor/Sensory Function: Within functional limits   Ice Chips Ice chips: Impaired Presentation: Spoon (fed; 3 trials) Oral Phase Impairments: Poor awareness of bolus Pharyngeal Phase Impairments: Cough - Immediate;Cough - Delayed;Suspected delayed Swallow   Thin Liquid Thin Liquid: Not tested    Nectar Thick Nectar Thick Liquid: Impaired Presentation: Spoon (fed; 4 trials) Oral Phase Impairments: Poor awareness of bolus Oral phase functional implications: Prolonged oral transit;Oral holding Pharyngeal Phase Impairments: Suspected delayed Swallow;Cough - Immediate;Cough - Delayed   Honey Thick Honey Thick Liquid: Not tested   Puree Puree: Impaired Presentation: Spoon (fed; 3 trials) Oral Phase Impairments: Poor awareness of bolus Oral Phase Functional Implications: Prolonged oral transit;Oral holding Pharyngeal Phase Impairments: Suspected  delayed Swallow;Cough - Immediate;Cough - Delayed   Solid     Solid: Not tested        Jerilynn Som, MS, CCC-SLP Speech Language Pathologist Rehab Services [phone] [facility] 05/22/2021,12:05 PM

## 2021-05-22 NOTE — Progress Notes (Signed)
PROGRESS NOTE    Rodney Kelly  YTK:[id number] DOB: 1924/07/07 DOA: 05/19/2021 PCP: Wilkie Aye, MD    Chief Complaint  Patient presents with   Wound Infection    Brief Narrative:   87 y.o. male with medical history significant of DM, HLD, PCD, GERD, CAD, MI, diabetic foot ulcer, dementia who presents with foot ulcer with infection, work-up significant for osteomyelitis, patient was seen by vascular surgery, no further work-up from their side, he has been followed by podiatry.  MRI significant for osteomyelitis, patient with severe hospital delirium, on 6/17 early a.m., patient with hypoxia, new oxygen requirement and chest x-ray showing aspiration in right lung base.  Assessment & Plan:   Principal Problem:   Diabetic foot infection (HCC) Active Problems:   Coronary artery disease   Type 2 diabetes mellitus with complication, without long-term current use of insulin (HCC)   Diabetic foot ulcers (HCC)   Hyperlipemia   GERD (gastroesophageal reflux disease)   Normocytic anemia   Cellulitis of right lower extremity   Right foot, cellulitis/mew fasciitis with abscess,  osteomyelitis right great toe and first metatarsal bone. - Patient failed outpatient oral antibiotic doxycycline treatment.  -  Patient has tachycardia, but no fever, tachypnea or leukocytosis.  Clinically not septic.  Lactic acid normal 1.2. Dr. Excell Seltzer of podiatry and Dr. Wyn Quaker of [facility] are consulted. - Vascular surgery input greatly appreciated, no further work-up currently, outpatient follow-up. -No evidence of DVT on venous Dopplers -Podiatry input greatly appreciated, plan for right partial first ray amputation with left heel wound debridement, initial plan was for Friday, his hospital delirium, and aspiration, this has postponed pending clinical improvement . -Initially on vancomycin, wound culture growing MSSA, so for now will continue cefepime and Flagyl.  .  Acute toxic encephalopathy/hospital  delirium -Continue with as needed Haldol, patient has a sitter, have discussed with son, encouraged him to have him or family member stay next to patient to have familiar faces, will try to minimize narcotics. -Will check CT head.  Aspiration pneumonia -Chest x-ray significant for right lung base opacity, he was started on cefepime and Flagyl and insulin allergy.   -Was seen by SLP, to be kept n.p.o., failed swallow evaluation mainly due to his encephalopathy.    Hypokalemia -Potassium 3.4, will replete IV as n.p.o., will add magnesium and phosphorus to morning labs.   Coronary artery disease:  -No CP. S/p of stent per his son -ASA, tricor, Imdur -prn NGT  Urinary retention -Foley catheter inserted 6/17,  started on Flomax.   Type 2 diabetes mellitus with complication of foot ulcer, without long-term current use of insulin (HCC): Recent A1c 6.4, well controlled.  Patient taking metformin at home -Sliding scale insulin   Hyperlipemia -Fenofibrate -Patient is not taking Lipitor currently   GERD (gastroesophageal reflux disease) -Protonix   Normocytic anemia:  -Plan, this is most likely anemia of chronic disease      DVT prophylaxis: Heparin Code Status: Full Family Communication: Discussed with son at bedside daily  Disposition:   Status is: Inpatient  Remains inpatient appropriate because:IV treatments appropriate due to intensity of illness or inability to take PO  Dispo: The patient is from: Home              Anticipated d/c is to: Home              Patient currently is not medically stable to d/c.   Difficult to place patient No  Consultants:  Podiatry Vascular surgery  Subjective: Patient remains confused, restless, seen by SLP this morning and failed swallow evaluation.  Objective: Vitals:   05/21/21 2231 05/22/21 0026 05/22/21 0433 05/22/21 0902  BP: 130/78 126/73 (!) 142/81 129/65  Pulse: (!) 106 (!) 102 (!) 101 80  Resp: 17 17 19 14   Temp:  99.2 F (37.3 C) 98.4 F (36.9 C) 98.9 F (37.2 C) 97.6 F (36.4 C)  TempSrc:      SpO2: 96% 98% 98% 100%  Weight:      Height:        Intake/Output Summary (Last 24 hours) at 05/22/2021 1204 Last data filed at 05/22/2021 1145 Gross per 24 hour  Intake 1174.98 ml  Output 2175 ml  Net -1000.02 ml    Filed Weights   05/19/21 0722  Weight: 59 kg    Examination:  He is more awake today, but remains significantly confused and incoherent, frail, deconditioned  Symmetrical Chest wall movement, Good air movement bilaterally,  RRR,No Gallops,Rubs or new Murmurs, No Parasternal Heave +ve B.Sounds, Abd Soft, No tenderness, No rebound - guarding or rigidity. No Cyanosis, both feet warm to touch       Data Reviewed: I have personally reviewed following labs and imaging studies  CBC: Recent Labs  Lab 05/19/21 0728 05/20/21 0416 05/21/21 0930 05/22/21 0456  WBC 9.1 7.5 10.2 8.1  NEUTROABS 6.8  --   --   --   HGB 10.5* 9.7* 10.4* 9.5*  HCT 32.7* 29.6* 32.1* 29.6*  MCV 88.9 86.0 87.7 87.1  PLT 323 281 337 333    Basic Metabolic Panel: Recent Labs  Lab 05/19/21 0728 05/21/21 0404 05/21/21 0930 05/22/21 0456  NA 139  --  136 137  K 3.8  --  3.6 3.4*  CL 102  --  103 107  CO2 27  --  27 24  GLUCOSE 200*  --  144* 126*  BUN 18  --  15 12  CREATININE 0.80 0.63 0.55* 0.59*  CALCIUM 9.5  --  9.2 9.1    GFR: Estimated Creatinine Clearance: 47.1 mL/min (A) (by C-G formula based on SCr of 0.59 mg/dL (L)).  Liver Function Tests: Recent Labs  Lab 05/19/21 0728  AST 12*  ALT 12  ALKPHOS 74  BILITOT 0.6  PROT 7.3  ALBUMIN 3.1*    CBG: Recent Labs  Lab 05/21/21 0828 05/21/21 1159 05/21/21 1626 05/21/21 2149 05/22/21 0747  GLUCAP 145* 128* 146* 140* 126*     Recent Results (from the past 240 hour(s))  Blood Cultures x 2 sites     Status: None (Preliminary result)   Collection Time: 05/19/21  9:35 AM   Specimen: BLOOD  Result Value Ref Range Status    Specimen Description BLOOD LEFT AC  Final   Special Requests   Final    BOTTLES DRAWN AEROBIC AND ANAEROBIC Blood Culture results may not be optimal due to an excessive volume of blood received in culture bottles   Culture   Final    NO GROWTH 3 DAYS Performed at [facility], [address], [place], [place]    Report Status PENDING  Incomplete  Blood Cultures x 2 sites     Status: None (Preliminary result)   Collection Time: 05/19/21  9:35 AM   Specimen: BLOOD  Result Value Ref Range Status   Specimen Description BLOOD  RIGHT [facility]  Final   Special Requests   Final    BOTTLES DRAWN AEROBIC AND  ANAEROBIC Blood Culture adequate volume   Culture   Final    NO GROWTH 3 DAYS Performed at [facility], [address] [place], [place] [address]    Report Status PENDING  Incomplete  SARS CORONAVIRUS 2 (TAT 6-24 HRS) Nasopharyngeal Nasopharyngeal Swab     Status: None   Collection Time: 05/19/21  9:35 AM   Specimen: Nasopharyngeal Swab  Result Value Ref Range Status   SARS Coronavirus 2 NEGATIVE NEGATIVE Final    Comment: (NOTE) SARS-CoV-2 target nucleic acids are NOT DETECTED.  The SARS-CoV-2 RNA is generally detectable in upper and lower respiratory specimens during the acute phase of infection. Negative results do not preclude SARS-CoV-2 infection, do not rule out co-infections with other pathogens, and should not be used as the sole basis for treatment or other patient management decisions. Negative results must be combined with clinical observations, patient history, and epidemiological information. The expected result is Negative.  Fact Sheet for Patients: HairSlick.no  Fact Sheet for Healthcare Providers: quierodirigir.com  This test is not yet approved or cleared by the [place] FDA and  has been authorized for detection and/or diagnosis of SARS-CoV-2 by FDA under an Emergency Use  Authorization (EUA). This EUA will remain  in effect (meaning this test can be used) for the duration of the COVID-19 declaration under Se ction 564(b)(1) of the Act, 21 U.S.C. section 360bbb-3(b)(1), unless the authorization is terminated or revoked sooner.  Performed at [facility], [address] [place], [place] [address]   Aerobic/Anaerobic Culture w Gram Stain (surgical/deep wound)     Status: None (Preliminary result)   Collection Time: 05/19/21 12:36 PM   Specimen: Wound  Result Value Ref Range Status   Specimen Description   Final    WOUND Performed at [facility], [address] [place], [place] [address]    Special Requests   Final    NONE Performed at [facility], [address] [place], [place] [address]    Gram Stain   Final    RARE WBC PRESENT, PREDOMINANTLY MONONUCLEAR RARE GRAM POSITIVE COCCI Performed at [facility], [address] [place], [place] [address]    Culture   Final    RARE STAPHYLOCOCCUS EPIDERMIDIS NO ANAEROBES ISOLATED; CULTURE IN PROGRESS FOR 5 DAYS    Report Status PENDING  Incomplete   Organism ID, Bacteria STAPHYLOCOCCUS EPIDERMIDIS  Final      Susceptibility   Staphylococcus epidermidis - MIC*    CIPROFLOXACIN <=0.5 SENSITIVE Sensitive     ERYTHROMYCIN >=8 RESISTANT Resistant     GENTAMICIN <=0.5 SENSITIVE Sensitive     OXACILLIN <=0.25 SENSITIVE Sensitive     TETRACYCLINE >=16 RESISTANT Resistant     VANCOMYCIN 1 SENSITIVE Sensitive     TRIMETH/SULFA <=10 SENSITIVE Sensitive     CLINDAMYCIN <=0.25 SENSITIVE Sensitive     RIFAMPIN <=0.5 SENSITIVE Sensitive     Inducible Clindamycin NEGATIVE Sensitive     * RARE STAPHYLOCOCCUS EPIDERMIDIS  MRSA PCR Screening     Status: None   Collection Time: 05/20/21  3:50 PM  Result Value Ref Range Status   MRSA by PCR NEGATIVE NEGATIVE Final    Comment:        The GeneXpert MRSA Assay (FDA approved for NASAL specimens only), is one component of  a comprehensive MRSA colonization surveillance program. It is not intended to diagnose MRSA infection nor to guide or monitor treatment for MRSA infections. Performed at [facility], [phone]  [address] [place], [place] [address]          Radiology Studies: DG Chest [place]  Result Date: 05/21/2021 CLINICAL DATA:  Aspiration into the airway EXAM: PORTABLE CHEST 1 VIEW COMPARISON:  None. FINDINGS: Coarse reticular opacities and airways thickening present in the right lung base. The more mild reticular changes seen elsewhere in the lungs. No pneumothorax or visible effusion. Biapical pleuroparenchymal scarring. The aorta is calcified. The remaining cardiomediastinal contours are unremarkable. No acute osseous or soft tissue abnormality. Degenerative changes are present in the imaged spine and shoulders. Multiple surgical clips at base of the neck, a prior thyroidectomy. IMPRESSION: Coarse reticular changes are present in the right lung base with additional airways thickening. Could reflect sequela of aspiration in the appropriate clinical setting. The more diffusely increased interstitial markings elsewhere are nonspecific could reflect findings of an underlying interstitial disease. Aortic Atherosclerosis (ICD10-I70.0). Electronically Signed   By: Kreg ShropshirePrice  DeHay M.D.   On: 05/21/2021 05:16        Scheduled Meds:  aspirin  81 mg Oral Daily   Chlorhexidine Gluconate Cloth  6 each Topical Daily   fenofibrate  160 mg Oral Daily   heparin  5,000 Units Subcutaneous Q8H   insulin aspart  0-5 Units Subcutaneous QHS   insulin aspart  0-9 Units Subcutaneous TID WC   isosorbide mononitrate  30 mg Oral Daily   mupirocin ointment   Topical Daily   pantoprazole  40 mg Oral Daily   pregabalin  75 mg Oral BID   sodium chloride flush  10 mL Intravenous Q12H   tamsulosin  0.4 mg Oral QPC supper   Continuous Infusions:  ceFEPime (MAXIPIME) IV Stopped (05/22/21 0056)   lactated ringers 75  mL/hr at 05/22/21 0920   metronidazole 500 mg (05/22/21 0920)     LOS: 3 days       Huey Bienenstockawood Tyiesha Brackney, MD [facility]   To contact the attending provider between 7A-7P or the covering provider during after hours 7P-7A, please log into the web site [email or web address] and access using universal [place] password for that web site. If you do not have the password, please call the hospital operator.  05/22/2021, 12:04 PM

## 2021-05-22 NOTE — Progress Notes (Signed)
OT Cancellation Note  Patient Details Name: Rodney Kelly MRN: [id number] DOB: 10/19/1924   Cancelled Treatment:    Reason Eval/Treat Not Completed: Patient not medically ready (Chart reviewed. Pt scheduled for first ray amputation and likely debridement of the ulcer on the left heel, per podiatry. Will also need b/l post-op shoes. Awaiting further orders/recs post op.)  [name], MS, OTR/L 05/22/2021, 9:08 AM

## 2021-05-22 NOTE — Progress Notes (Signed)
PT Cancellation Note  Patient Details Name: Rodney Kelly MRN: [id number] DOB: 05/14/1924   Cancelled Treatment:    Reason Eval/Treat Not Completed: Medical issues which prohibited therapy; Chart reviewed. Pt scheduled for first ray amputation and likely debridement of the ulcer on the left heel, per podiatry. Will also need b/l post-op shoes. Awaiting further orders/recs post op.   Ovidio Hanger PT, DPT 05/22/21, 8:17 AM

## 2021-05-23 ENCOUNTER — Inpatient Hospital Stay
Admit: 2021-05-23 | Discharge: 2021-05-23 | Disposition: A | Discharge status: Home / Self Care | Payer: Medicare PPO | Attending: Acute Care | Admitting: Acute Care

## 2021-05-23 ENCOUNTER — Other Ambulatory Visit: Payer: Self-pay

## 2021-05-23 ENCOUNTER — Inpatient Hospital Stay: Payer: Medicare PPO

## 2021-05-23 DIAGNOSIS — R7989 Other specified abnormal findings of blood chemistry: Secondary | ICD-10-CM

## 2021-05-23 DIAGNOSIS — R410 Disorientation, unspecified: Secondary | ICD-10-CM

## 2021-05-23 LAB — CBC
HCT: 32.9 % — ABNORMAL LOW (ref 39.0–52.0)
Hemoglobin: 10.8 g/dL — ABNORMAL LOW (ref 13.0–17.0)
MCH: 28.2 pg (ref 26.0–34.0)
MCHC: 32.8 g/dL (ref 30.0–36.0)
MCV: 85.9 fL (ref 80.0–100.0)
Platelets: 371 10*3/uL (ref 150–400)
RBC: 3.83 MIL/uL — ABNORMAL LOW (ref 4.22–5.81)
RDW: 14.8 % (ref 11.5–15.5)
WBC: 9.1 10*3/uL (ref 4.0–10.5)
nRBC: 0 % (ref 0.0–0.2)

## 2021-05-23 LAB — PROCALCITONIN: Procalcitonin: <0.1 ng/mL

## 2021-05-23 LAB — BASIC METABOLIC PANEL
Anion gap: 11 (ref 5–15)
BUN: 13 mg/dL (ref 8–23)
CO2: 23 mmol/L (ref 22–32)
Calcium: 9.2 mg/dL (ref 8.9–10.3)
Chloride: 105 mmol/L (ref 98–111)
Creatinine, Ser: 0.71 mg/dL (ref 0.61–1.24)
GFR, Estimated: >60 mL/min (ref >60)
Glucose, Bld: 123 mg/dL — ABNORMAL HIGH (ref 70–99)
Potassium: 3.5 mmol/L (ref 3.5–5.1)
Sodium: 139 mmol/L (ref 135–145)

## 2021-05-23 LAB — ECHOCARDIOGRAM COMPLETE
AR max vel: 3.22 cm2
AV Area VTI: 3.46 cm2
AV Area mean vel: 3.2 cm2
AV Mean grad: 3 mmHg
AV Peak grad: 5.1 mmHg
Ao pk vel: 1.13 m/s
Height: 69 in
S' Lateral: 2.6 cm
Weight: 2080 oz

## 2021-05-23 LAB — GLUCOSE, CAPILLARY
Glucose-Capillary: 130 mg/dL — ABNORMAL HIGH (ref 70–99)
Glucose-Capillary: 105 mg/dL — ABNORMAL HIGH (ref 70–99)
Glucose-Capillary: 118 mg/dL — ABNORMAL HIGH (ref 70–99)
Glucose-Capillary: 128 mg/dL — ABNORMAL HIGH (ref 70–99)

## 2021-05-23 LAB — BRAIN NATRIURETIC PEPTIDE: B Natriuretic Peptide: 156.5 pg/mL — ABNORMAL HIGH (ref 0.0–100.0)

## 2021-05-23 LAB — PHOSPHORUS: Phosphorus: 2.5 mg/dL (ref 2.5–4.6)

## 2021-05-23 LAB — D-DIMER, QUANTITATIVE: D-Dimer, Quant: 3.49 ug/mL-FEU — ABNORMAL HIGH (ref 0.00–0.50)

## 2021-05-23 LAB — MAGNESIUM: Magnesium: 1.7 mg/dL (ref 1.7–2.4)

## 2021-05-23 LAB — TSH: TSH: 4.451 u[IU]/mL (ref 0.350–4.500)

## 2021-05-23 MED ORDER — MAGNESIUM SULFATE 2 GM/50ML IV SOLN: 2.0000 g | Freq: Once | INTRAVENOUS | Status: DC

## 2021-05-23 MED ORDER — ENOXAPARIN SODIUM 40 MG/0.4ML IJ SOSY
40.0000 mg | PREFILLED_SYRINGE | INTRAMUSCULAR | Status: DC
  Administered 2021-05-23 – 2021-06-07 (×14): 40 mg via SUBCUTANEOUS
  Filled 2021-05-23 (×15): qty 0.4

## 2021-05-23 MED ORDER — ENOXAPARIN SODIUM 60 MG/0.6ML IJ SOSY: 1.0000 mg/kg | PREFILLED_SYRINGE | Freq: Two times a day (BID) | INTRAMUSCULAR | Status: DC

## 2021-05-23 MED ORDER — MAGNESIUM SULFATE 2 GM/50ML IV SOLN
2.0000 g | Freq: Once | INTRAVENOUS | Status: AC
  Administered 2021-05-23: 2 g via INTRAVENOUS
  Filled 2021-05-23: qty 50

## 2021-05-23 MED ORDER — METOPROLOL TARTRATE 5 MG/5ML IV SOLN
2.5000 mg | Freq: Four times a day (QID) | INTRAVENOUS | Status: DC
  Administered 2021-05-23 – 2021-05-25 (×12): 2.5 mg via INTRAVENOUS
  Filled 2021-05-23 (×11): qty 5

## 2021-05-23 MED ORDER — LORAZEPAM 2 MG/ML IJ SOLN: 0.5000 mg | Freq: Four times a day (QID) | INTRAMUSCULAR | Status: DC | PRN

## 2021-05-23 MED ORDER — METOPROLOL TARTRATE 5 MG/5ML IV SOLN: 5.0000 mg | INTRAVENOUS | Status: DC | PRN

## 2021-05-23 MED ORDER — ENOXAPARIN SODIUM 40 MG/0.4ML IJ SOSY: 40.0000 mg | PREFILLED_SYRINGE | INTRAMUSCULAR | Status: DC

## 2021-05-23 MED ORDER — POTASSIUM CHLORIDE 10 MEQ/100ML IV SOLN
10.0000 meq | INTRAVENOUS | Status: AC
  Administered 2021-05-23 (×3): 10 meq via INTRAVENOUS
  Filled 2021-05-23 (×3): qty 100

## 2021-05-23 NOTE — Progress Notes (Signed)
PROGRESS NOTE    Rodney Kelly  YJE:[id number] DOB: December 26, 1923 DOA: 05/19/2021 PCP: Wilkie Aye, MD    Chief Complaint  Patient presents with   Wound Infection    Brief Narrative:   87 y.o. male with medical history significant of DM, HLD, PCD, GERD, CAD, MI, diabetic foot ulcer, dementia who presents with foot ulcer with infection, work-up significant for osteomyelitis, patient was seen by vascular surgery, no further work-up from their side, he has been followed by podiatry.  MRI significant for osteomyelitis, patient with severe hospital delirium, on 6/17 early a.m., patient with hypoxia, new oxygen requirement and chest x-ray showing aspiration in right lung base.  Hospital course complicated by severe delirium, causing severe dysphagia, where he is strictly n.p.o. even for medications.  Assessment & Plan:   Principal Problem:   Diabetic foot infection (HCC) Active Problems:   Coronary artery disease   Type 2 diabetes mellitus with complication, without long-term current use of insulin (HCC)   Diabetic foot ulcers (HCC)   Hyperlipemia   GERD (gastroesophageal reflux disease)   Normocytic anemia   Cellulitis of right lower extremity   Right foot, cellulitis/myofasciitis with abscess,  osteomyelitis right great toe and first metatarsal bone. - Patient failed outpatient oral antibiotic doxycycline treatment.  -  Patient has tachycardia, but no fever, tachypnea or leukocytosis.  Clinically not septic.  Lactic acid normal 1.2. Dr. Excell Seltzer of podiatry and Dr. Wyn Quaker of [facility] are consulted. - Vascular surgery input greatly appreciated, no further work-up currently, outpatient follow-up. -No evidence of DVT on venous Dopplers -Podiatry input greatly appreciated, plan for right partial first ray amputation with left heel wound debridement, initial plan was for Friday, his hospital delirium, and aspiration, this has postponed pending clinical improvement . -Initially on  vancomycin, it has been stopped given wound culture growing MSSA, as well he is MRSA screen PCR negative, and<10.  Current antibiotics cefepime/Flagyl should provide good coverage for this pathogen   Acute toxic encephalopathy/hospital delirium -Patient with severe hospital delirium, he is having a bedside sitter, as needed Haldol as well, trying to minimize narcotics and benzo diazepam, increased frequency of reorientation, trying to correct day/night cycle. -Trying to to avoid Ativan, this leaves me only with haloperidol as needed -Continue with as needed Haldol, patient has a sitter, have discussed with son, encouraged him to have him or family member stay next to patient to have familiar faces. -No acute findings  Aspiration pneumonia -Chest x-ray significant for right lung base opacity, he was started on cefepime and Flagyl and insulin allergy.   -Was seen by SLP, to be kept n.p.o., failed swallow evaluation mainly due to his encephalopathy.  Sinus tachycardia -Overnight patient heart rate in the 130s, one of the EKG is showing A. fib, cardiology Dr. Richardson Dopp would reviewed the EKG, doubt it is A. fib, most likely sinus tachycardia with PACs versus SVT, I could not appreciate any significant A. fib on his telemetry strip, he is on metoprolol 2.5 mg every 6 hours IV (given his n.p.o.), at this point I see no indication for full anticoagulation as cannot confirm A. fib.   Hypokalemia -Replete as needed.   Coronary artery disease:  -No CP. S/p of stent per his son -ASA, tricor, Imdur -prn NGT  Elevated D-dimers -Telemetry venous Dopplers negative for DVT on admission, will check left venous Dopplers, meanwhile continue with Lovenox 40 mg subcu daily.  Urinary retention -Foley catheter inserted 6/17,  started on Flomax.   Type 2  diabetes mellitus with complication of foot ulcer, without long-term current use of insulin (HCC): Recent A1c 6.4, well controlled.  Patient taking metformin at  home -Sliding scale insulin   Hyperlipemia -Fenofibrate -Patient is not taking Lipitor currently   GERD (gastroesophageal reflux disease) -Protonix   Normocytic anemia:  -Plan, this is most likely anemia of chronic disease    Son requested patient to be transferred to [facility], I have called [facility] to requested transfer , have discussed with internist on-call Dr. Seth Bake, given there is no indication for transfer there, and all services available at our facility, they have declined transfer especially they are at capacity, I have updated the son at  DVT prophylaxis: Heparin Code Status: Full Family Communication: Discussed with son at bedside multiple times every day.    Disposition:   Status is: Inpatient  Remains inpatient appropriate because:IV treatments appropriate due to intensity of illness or inability to take PO  Dispo: The patient is from: Home              Anticipated d/c is to: Home              Patient currently is not medically stable to d/c.   Difficult to place patient No       Consultants:  Podiatry Vascular surgery  Subjective: Patient remains confused, restless, seen by SLP this morning and failed swallow evaluation.  Objective: Vitals:   05/22/21 2010 05/23/21 0024 05/23/21 0351 05/23/21 0730  BP: 131/75 (!) 121/57 109/64 125/61  Pulse: (!) 110 82 (!) 103 85  Resp: Temp: 98.1 F (36.7 C) 99 F (37.2 C) 98.5 F (36.9 C) 98.4 F (36.9 C)  TempSrc:    Oral  SpO2: 96% 98% 95% 99%  Weight:      Height:        Intake/Output Summary (Last 24 hours) at 05/23/2021 1030 Last data filed at 05/23/2021 0518 Gross per 24 hour  Intake 2533.82 ml  Output 1425 ml  Net 1108.82 ml    Filed Weights   05/19/21 0722  Weight: 59 kg    Examination:  Awake, confused, does not follow commands, frail alert. Symmetrical Chest wall movement, Good air movement bilaterally, CTAB RRR,No Gallops,Rubs or new Murmurs, No Parasternal Heave +ve  B.Sounds, Abd Soft, No tenderness, No rebound - guarding or rigidity. No Cyanosis, both feet is covered with Ace wrap bilaterally.    Data Reviewed: I have personally reviewed following labs and imaging studies  CBC: Recent Labs  Lab 05/19/21 0728 05/20/21 0416 05/21/21 0930 05/22/21 0456 05/23/21 0514  WBC 9.1 7.5 10.2 8.1 9.1  NEUTROABS 6.8  --   --   --   --   HGB 10.5* 9.7* 10.4* 9.5* 10.8*  HCT 32.7* 29.6* 32.1* 29.6* 32.9*  MCV 88.9 86.0 87.7 87.1 85.9  PLT 323 281 337 333 371    Basic Metabolic Panel: Recent Labs  Lab 05/19/21 0728 05/21/21 0404 05/21/21 0930 05/22/21 0456 05/22/21 2336 05/23/21 0514  NA 139  --  136 137  --  139  K 3.8  --  3.6 3.4*  --  3.5  CL 102  --  103 107  --  105  CO2 27  --  27 24  --  23  GLUCOSE 200*  --  144* 126*  --  123*  BUN 18  --  15 12  --  13  CREATININE 0.80 0.63 0.55* 0.59*  --  0.71  CALCIUM 9.5  --  9.2 9.1  --  9.2  MG  --   --   --   --  1.7  --   PHOS  --   --   --   --   --  2.5    GFR: Estimated Creatinine Clearance: 47.1 mL/min (by C-G formula based on SCr of 0.71 mg/dL).  Liver Function Tests: Recent Labs  Lab 05/19/21 0728  AST 12*  ALT 12  ALKPHOS 74  BILITOT 0.6  PROT 7.3  ALBUMIN 3.1*    CBG: Recent Labs  Lab 05/22/21 0747 05/22/21 1212 05/22/21 1546 05/22/21 2124 05/23/21 0714  GLUCAP 126* 124* 126* 123* 130*     Recent Results (from the past 240 hour(s))  Blood Cultures x 2 sites     Status: None (Preliminary result)   Collection Time: 05/19/21  9:35 AM   Specimen: BLOOD  Result Value Ref Range Status   Specimen Description BLOOD LEFT AC  Final   Special Requests   Final    BOTTLES DRAWN AEROBIC AND ANAEROBIC Blood Culture results may not be optimal due to an excessive volume of blood received in culture bottles   Culture   Final    NO GROWTH 4 DAYS Performed at [facility], [address] [place], [place] [address]    Report Status PENDING  Incomplete  Blood  Cultures x 2 sites     Status: None (Preliminary result)   Collection Time: 05/19/21  9:35 AM   Specimen: BLOOD  Result Value Ref Range Status   Specimen Description BLOOD  RIGHT [facility]  Final   Special Requests   Final    BOTTLES DRAWN AEROBIC AND ANAEROBIC Blood Culture adequate volume   Culture   Final    NO GROWTH 4 DAYS Performed at [facility], [address] [place], [place] [address]    Report Status PENDING  Incomplete  SARS CORONAVIRUS 2 (TAT 6-24 HRS) Nasopharyngeal Nasopharyngeal Swab     Status: None   Collection Time: 05/19/21  9:35 AM   Specimen: Nasopharyngeal Swab  Result Value Ref Range Status   SARS Coronavirus 2 NEGATIVE NEGATIVE Final    Comment: (NOTE) SARS-CoV-2 target nucleic acids are NOT DETECTED.  The SARS-CoV-2 RNA is generally detectable in upper and lower respiratory specimens during the acute phase of infection. Negative results do not preclude SARS-CoV-2 infection, do not rule out co-infections with other pathogens, and should not be used as the sole basis for treatment or other patient management decisions. Negative results must be combined with clinical observations, patient history, and epidemiological information. The expected result is Negative.  Fact Sheet for Patients: HairSlick.nohttps://www.fda.gov/media/138098/download  Fact Sheet for Healthcare Providers: quierodirigir.comhttps://www.fda.gov/media/138095/download  This test is not yet approved or cleared by the [place] FDA and  has been authorized for detection and/or diagnosis of SARS-CoV-2 by FDA under an Emergency Use Authorization (EUA). This EUA will remain  in effect (meaning this test can be used) for the duration of the COVID-19 declaration under Se ction 564(b)(1) of the Act, 21 U.S.C. section 360bbb-3(b)(1), unless the authorization is terminated or revoked sooner.  Performed at [facility], [address] [place], [place] [address]   Aerobic/Anaerobic Culture w Gram Stain  (surgical/deep wound)     Status: None (Preliminary result)   Collection Time: 05/19/21 12:36 PM   Specimen: Wound  Result Value Ref Range Status   Specimen Description   Final    WOUND Performed  at [facility], [address], [place], [place] [address]    Special Requests   Final    NONE Performed at [facility], [address] [place], [place] [address]    Gram Stain   Final    RARE WBC PRESENT, PREDOMINANTLY MONONUCLEAR RARE GRAM POSITIVE COCCI Performed at [facility], [address], [place], [place] [address]    Culture   Final    RARE STAPHYLOCOCCUS EPIDERMIDIS CULTURE REINCUBATED FOR BETTER GROWTH NO ANAEROBES ISOLATED; CULTURE IN PROGRESS FOR 5 DAYS    Report Status PENDING  Incomplete   Organism ID, Bacteria STAPHYLOCOCCUS EPIDERMIDIS  Final      Susceptibility   Staphylococcus epidermidis - MIC*    CIPROFLOXACIN <=0.5 SENSITIVE Sensitive     ERYTHROMYCIN >=8 RESISTANT Resistant     GENTAMICIN <=0.5 SENSITIVE Sensitive     OXACILLIN <=0.25 SENSITIVE Sensitive     TETRACYCLINE >=16 RESISTANT Resistant     VANCOMYCIN 1 SENSITIVE Sensitive     TRIMETH/SULFA <=10 SENSITIVE Sensitive     CLINDAMYCIN <=0.25 SENSITIVE Sensitive     RIFAMPIN <=0.5 SENSITIVE Sensitive     Inducible Clindamycin NEGATIVE Sensitive     * RARE STAPHYLOCOCCUS EPIDERMIDIS  MRSA PCR Screening     Status: None   Collection Time: 05/20/21  3:50 PM  Result Value Ref Range Status   MRSA by PCR NEGATIVE NEGATIVE Final    Comment:        The GeneXpert MRSA Assay (FDA approved for NASAL specimens only), is one component of a comprehensive MRSA colonization surveillance program. It is not intended to diagnose MRSA infection nor to guide or monitor treatment for MRSA infections. Performed at [facility], [address], [place], [place] [address]          Radiology Studies: CT HEAD WO CONTRAST  Result Date: 05/22/2021 CLINICAL DATA:  Delirium.  EXAM: CT HEAD WITHOUT CONTRAST TECHNIQUE: Contiguous axial images were obtained from the base of the skull through the vertex without intravenous contrast. COMPARISON:  None FINDINGS: Brain: Mild atrophy and white matter changes are within normal limits for age. No acute infarct, hemorrhage, or mass lesion is present. The ventricles are of normal size. No significant extraaxial fluid collection is present. The brainstem and cerebellum are within normal limits. Vascular: Atherosclerotic calcifications present within the cavernous internal carotid arteries bilaterally. No hyperdense vessel is present. Skull: Calvarium is intact. No focal lytic or blastic lesions are present. No significant extracranial soft tissue lesion is present. Sinuses/Orbits: The paranasal sinuses and mastoid air cells are clear. Bilateral lens replacements are noted. Globes and orbits are otherwise unremarkable. IMPRESSION: Normal CT appearance of the brain for age. No acute or focal abnormality to explain the patient's delirium. Electronically Signed   By: Marin Roberts M.D.   On: 05/22/2021 14:53        Scheduled Meds:  aspirin  81 mg Oral Daily   Chlorhexidine Gluconate Cloth  6 each Topical Daily   fenofibrate  160 mg Oral Daily   insulin aspart  0-5 Units Subcutaneous QHS   insulin aspart  0-9 Units Subcutaneous TID WC   isosorbide mononitrate  30 mg Oral Daily   metoprolol tartrate  2.5 mg Intravenous Q6H   mupirocin ointment   Topical Daily   pantoprazole  40 mg Oral Daily   pregabalin  75 mg Oral BID   sodium chloride flush  10 mL Intravenous Q12H   tamsulosin  0.4 mg Oral QPC  supper   Continuous Infusions:  ceFEPime (MAXIPIME) IV Stopped (05/23/21 0040)   lactated ringers 100 mL/hr at 05/23/21 0518   metronidazole 500 mg (05/23/21 0957)   potassium chloride 10 mEq (05/23/21 1000)     LOS: 4 days       Huey Bienenstock, MD [facility]   To contact the attending provider between 7A-7P or  the covering provider during after hours 7P-7A, please log into the web site [email or web address] and access using universal  [place] password for that web site. If you do not have the password, please call the hospital operator.  05/23/2021, 10:30 AM

## 2021-05-23 NOTE — Progress Notes (Signed)
Pt son and daughter-n-law. Asking for a transfer at this time. Md notified, son yelling and upset says his dad should have never aspirated and because we had given ativan that's why it happened. I myself have been in the room a total of nine times this morning due to the three bags of potassium, to suction patient and to give metoprolol IV for heart rate. Sitter Cherylann Ratel called me in three other times to pull patient up and because he was struggling to get the mucus out. Md was on floor and is calling [facility] to see if they have a bed. Patient is usually disoriented during the day but gets compative and tries to leave around 6pm. This is my fourth day with him and the agitation and combativeness happens every day late afternoon. Will monitor for updates from MD at this time

## 2021-05-23 NOTE — Progress Notes (Signed)
PT Cancellation Note  Patient Details Name: Rodney Kelly MRN: [id number] DOB: 1923-12-21   Cancelled Treatment:    Reason Eval/Treat Not Completed: Other (comment): Pt scheduled for first ray amputation and likely debridement of the ulcer on the left heel, per podiatry. Will also need bilateral post-op shoes. Awaiting further orders/recs post op.   Ovidio Hanger PT, DPT 05/23/21, 2:30 PM

## 2021-05-23 NOTE — Progress Notes (Signed)
Cross Cover Nurse reports elevated heart rate Patient is 87 year old with past history of 3 vessell CAD, MI s/p stent. hyperlipidemia, peripheral artery disease, hypertension , T2 diabetes, AAA s/p repair, and lower GI bleed, who was admitted with cellulits and osteomyelitis of right foot from chronic foot ulcer, and who developed acute resp fail.ure with hypoxia from aspiration pneumonia.  He has now developed atrial fib with RVR at 130's.  He has no prior history per record available and reviewed in epic He was given 5 mg of IV metoprolol, in which he converted to sinus rhythm.  His blood pressure has remained stable He is on daily aspirin. No anticoagulation initiated at this time (age/fall risk and hx significant lower GI bleed Mag supplemented TSH pending D dimer pending Echo ordered He is followed by [facility] cardiology - will need consult in am

## 2021-05-23 NOTE — Progress Notes (Signed)
Patients son and daughter -n-law are giving the patient ice even though they have been told he cant. They dont care and continue to do so. The daughtern law sat the patient up and pulled his IV out which was bleeding

## 2021-05-23 NOTE — Progress Notes (Signed)
ANTICOAGULATION CONSULT NOTE  Pharmacy Consult for enoxaparin Indication: atrial fibrillation  Allergies  Allergen Reactions   Penicillins Other (See Comments)    unknown   Sulfa Antibiotics Other (See Comments)    unknown    Patient Measurements: Height: 5\' 9"  (175.3 cm) Weight: 59 kg (130 lb) IBW/kg (Calculated) : 70.7  Vital Signs: Temp: 98.4 F (36.9 C) (06/19 0730) Temp Source: Oral (06/19 0730) BP: 125/61 (06/19 0730) Pulse Rate: 85 (06/19 0730)  Labs: Recent Labs    05/21/21 0930 05/22/21 0456 05/23/21 0514  HGB 10.4* 9.5* 10.8*  HCT 32.1* 29.6* 32.9*  PLT 337 333 371  CREATININE 0.55* 0.59* 0.71    Estimated Creatinine Clearance: 47.1 mL/min (by C-G formula based on SCr of 0.71 mg/dL).   Medical History: Past Medical History:  Diagnosis Date   Anginal pain (HCC)    Arthritis    Complication of anesthesia    spinal anesthesia caused nerve damage in 2002   Coronary artery disease    Diabetes mellitus without complication (HCC)    md stopped metformin   Diabetic ulcer of left foot (HCC)    GERD (gastroesophageal reflux disease)    Hyperlipemia    Multiple thyroid nodules    Myocardial infarction (HCC)    STEMI   Osteoporosis    Peripheral nerve disease    Ulceration of colon    NSAIDS   Vitamin D deficiency     Medications:  Scheduled:   aspirin  81 mg Oral Daily   Chlorhexidine Gluconate Cloth  6 each Topical Daily   enoxaparin (LOVENOX) injection  1 mg/kg Subcutaneous Q12H   fenofibrate  160 mg Oral Daily   insulin aspart  0-5 Units Subcutaneous QHS   insulin aspart  0-9 Units Subcutaneous TID WC   isosorbide mononitrate  30 mg Oral Daily   metoprolol tartrate  2.5 mg Intravenous Q6H   mupirocin ointment   Topical Daily   pantoprazole  40 mg Oral Daily   pregabalin  75 mg Oral BID   sodium chloride flush  10 mL Intravenous Q12H   tamsulosin  0.4 mg Oral QPC supper    Assessment: 87 y.o. male w/ h/o HLD, CAD, STEMI, DM, ,admitted  on 05/19/2021 with  admitted with a diabetic foot Infection and now with new onset atrial fibrillation with RVR this morning  Goal of Therapy:  Monitor platelets by anticoagulation protocol: Yes   Plan:  Start enoxaparin 1 mg/kg subcutaneously every 12 hours CBC at least every 3 days per protocol Monitor renal function for changes necessitating dose adjustments  05/21/2021 05/23/2021,7:44 AM

## 2021-05-23 NOTE — Progress Notes (Signed)
OT Cancellation Note  Patient Details Name: Rodney Kelly MRN: [id number] DOB: November 19, 1924   Cancelled Treatment:    Reason Eval/Treat Not Completed: Patient not medically ready. Pt scheduled for first ray amputation and likely debridement of the ulcer on the left heel, per podiatry. Will also need b/l post-op shoes. Awaiting further orders/recs post op.   Latina Craver 05/23/2021, 1:53 PM

## 2021-05-23 NOTE — Progress Notes (Signed)
Patient's Heart rates noted to be 120s - 130s on tele monitor. Provider on call notified, EKG ordered, same done showing Afib, metoprolol 5 mg IV ordered and was administered, see MAR. After receiving  metoprolol patient converted back to sinus rhythm, repeat EKG done to show conversion documentation.

## 2021-05-24 DIAGNOSIS — M86671 Other chronic osteomyelitis, right ankle and foot: Secondary | ICD-10-CM

## 2021-05-24 DIAGNOSIS — T148XXA Other injury of unspecified body region, initial encounter: Secondary | ICD-10-CM

## 2021-05-24 LAB — VITAMIN B12: Vitamin B-12: 579 pg/mL (ref 180–914)

## 2021-05-24 LAB — CULTURE, BLOOD (ROUTINE X 2)
Culture: NO GROWTH
Culture: NO GROWTH
Special Requests: ADEQUATE

## 2021-05-24 LAB — CBC
HCT: 32 % — ABNORMAL LOW (ref 39.0–52.0)
Hemoglobin: 10.5 g/dL — ABNORMAL LOW (ref 13.0–17.0)
MCH: 28.3 pg (ref 26.0–34.0)
MCHC: 32.8 g/dL (ref 30.0–36.0)
MCV: 86.3 fL (ref 80.0–100.0)
Platelets: 376 10*3/uL (ref 150–400)
RBC: 3.71 MIL/uL — ABNORMAL LOW (ref 4.22–5.81)
RDW: 15 % (ref 11.5–15.5)
WBC: 7.2 10*3/uL (ref 4.0–10.5)
nRBC: 0 % (ref 0.0–0.2)

## 2021-05-24 LAB — AEROBIC/ANAEROBIC CULTURE W GRAM STAIN (SURGICAL/DEEP WOUND)

## 2021-05-24 LAB — GLUCOSE, CAPILLARY
Glucose-Capillary: 109 mg/dL — ABNORMAL HIGH (ref 70–99)
Glucose-Capillary: 99 mg/dL (ref 70–99)
Glucose-Capillary: 134 mg/dL — ABNORMAL HIGH (ref 70–99)
Glucose-Capillary: 158 mg/dL — ABNORMAL HIGH (ref 70–99)

## 2021-05-24 LAB — BASIC METABOLIC PANEL
Anion gap: 11 (ref 5–15)
BUN: 14 mg/dL (ref 8–23)
CO2: 24 mmol/L (ref 22–32)
Calcium: 8.8 mg/dL — ABNORMAL LOW (ref 8.9–10.3)
Chloride: 104 mmol/L (ref 98–111)
Creatinine, Ser: 0.76 mg/dL (ref 0.61–1.24)
GFR, Estimated: >60 mL/min (ref >60)
Glucose, Bld: 108 mg/dL — ABNORMAL HIGH (ref 70–99)
Potassium: 3.5 mmol/L (ref 3.5–5.1)
Sodium: 139 mmol/L (ref 135–145)

## 2021-05-24 LAB — BRAIN NATRIURETIC PEPTIDE: B Natriuretic Peptide: 138.3 pg/mL — ABNORMAL HIGH (ref 0.0–100.0)

## 2021-05-24 MED ORDER — PANTOPRAZOLE SODIUM 40 MG IV SOLR
40.0000 mg | INTRAVENOUS | Status: DC
  Administered 2021-05-24 – 2021-05-26 (×3): 40 mg via INTRAVENOUS
  Filled 2021-05-24 (×3): qty 40

## 2021-05-24 MED ORDER — CEFAZOLIN SODIUM-DEXTROSE 2-4 GM/100ML-% IV SOLN
2.0000 g | Freq: Three times a day (TID) | INTRAVENOUS | Status: DC
  Administered 2021-05-24 – 2021-05-26 (×6): 2 g via INTRAVENOUS
  Filled 2021-05-24 (×9): qty 100

## 2021-05-24 NOTE — Progress Notes (Signed)
Speech Language Pathology Treatment: Dysphagia  Patient Details Name: Rodney Kelly MRN: [id number] DOB: December 01, 1924 Today's Date: 05/24/2021 Time: 1445-1600 SLP Time Calculation (min) (ACUTE ONLY): 75 min  Assessment / Plan / Recommendation Clinical Impression   Pt seen for ongoing assessment of toleration for oral intake; safety w/ po's. Pt continues to present w/ upper airway congestion; wet/phlegmy vocal quality at rest PRIOR to po's. He was seen 2x today: once while still in bed and needed more tactile/verbal cues to follow through w/ po trials(appeared min drowsy); then in the afternoon (~1445) post getting into the chair and seeming more engaged w/ his environment. He presented w/ less laryngeal-pharyngeal phlegm the 2nd session(after getting up and moving around).   Pt continues to present w/ oropharyngeal phase dysphagia in light of Significantly declined Cognitive status; advanced Dementia at his Baseline per chart. Pt is also exhibiting Mod-Severe increased upper airway congestion w/ mucousy, wet vocal quality at Rest PRIOR TO any po's -- minimal attempts to cough/clear this at times. He exhibits Decreased awareness and attention to the clearing the phlegm; suspect the advanced Cognitive decline impacts his overall awareness/engagement and safety during po tasks which increases risk for aspiration, choking. Pt's risk for aspiration, as well as meeting nutritional needs fully, is present at this time. It was difficult to discern pt's vocal quality and effectiveness of the pharygneal swallow w/ the amount of laryngeal-pharyngeal Phlegm at this morning's session (~11:45am), but it was somewhat improved in the afternoon(after getting up and moving around?). He required Mod tactile/verbal/ visual cues for orientation to bolus presentation, and during feeding support of the trials.   Pt consumed trials of ice chip, purees, and TSP sips of Nectar and Honey liquids via TSP. Overt coughing  (delayed/immediate) noted w/ ice chips trials w/ delayed, mild cough and intermittent wet vocal quality noted w/ purees and thickened liquids. Suspect impact of the Baseline phlegm in the larynx/pharynx, as well as delayed pharyngeal swallow initiation. Pt appeared to have a more controlled swallow w/ the Honey liquids. Also noted reduced laryngeal excursion and multiple swallows during each trial. Oral phase was c/b min prolonged bolus management w/ each trial consistency. Oral clearing given Time. Phlegmy quality to phonations/verbalizations intermittently before and during session.      D/t pt's declined Cognitive status, and his risk for aspiration, recommend initiation of the dysphagia level 1(puree) w/ Honey liquids by TSP; strict aspiration precautions and feeding support by NSG at this time -- stopping if increased s/s of aspiration noted; reduce Distractions during meals and only give po's when pt is alert/engaged. Pills Crushed in Puree for safer swallowing. Support w/ feeding at meals -- check for oral clearing during/post intake. NSG updated. ST services recommends follow w/ Palliative Care for GOC and to discuss the impact of Dementia and swallowing w/ family, pt. Largely suspect that pt's Advanced Dementia could hamper oral intake, meeting hydration needs, and upgrade of diet. Precautions posted in room. The above was discussed and agreed upon w/ NSG, MD.    Son and Sitter/NSG educated on use of swabs for continued oral care and oral stimulation. Education had w/ Son on dysphagia; aspiration; aspiration precautions; impact of Dementia on swallowing.      HPI HPI: Pt is a 87 y.o. male with medical history significant of DM, HLD, PCD, GERD, CAD, MI, diabetic foot ulcer, dementia who presents with foot ulcer with infection.  Patient has chronic bilateral foot ulcers.  Patient has been followed up with Dr. Ether Griffins of podiatry.  Per chart notes, Pt scheduled for first ray amputation and likely  debridement of the ulcer on the left heel, per podiatry.  CXR: Coarse reticular changes are present in the right lung base with  additional airways thickening. Could reflect sequela of aspiration  in the appropriate clinical setting.  The more diffusely increased interstitial markings elsewhere are  nonspecific could reflect findings of an underlying interstitial  disease.      SLP Plan  Continue with current plan of care       Recommendations  Diet recommendations: Dysphagia 1 (puree);Honey-thick liquid Liquids provided via: Teaspoon;No straw Medication Administration: Crushed with puree Supervision: Staff to assist with self feeding;Full supervision/cueing for compensatory strategies Compensations: Minimize environmental distractions;Slow rate;Small sips/bites;Lingual sweep for clearance of pocketing;Multiple dry swallows after each bite/sip;Follow solids with liquid Postural Changes and/or Swallow Maneuvers: Out of bed for meals;Seated upright 90 degrees;Upright 30-60 min after meal                General recommendations:  (Dietician f/u; Palliative Care f/u) Oral Care Recommendations: Oral care BID;Oral care before and after PO;Staff/trained caregiver to provide oral care Follow up Recommendations: Skilled Nursing facility (TBD) SLP Visit Diagnosis: Dysphagia, oropharyngeal phase (R13.12) (significant Cognitive decline) Plan: Continue with current plan of care       GO                  Jerilynn Som, MS, CCC-SLP Speech Language Pathologist Rehab Services [phone] [facility] 05/24/2021, 4:52 PM

## 2021-05-24 NOTE — Progress Notes (Signed)
   05/21/21 2006  Assess: MEWS Score  Temp 98.6 F (37 C)  BP (!) 168/98  Pulse Rate (!) 126  Resp 18  Level of Consciousness Alert  SpO2 99 %  Assess: MEWS Score  MEWS Temp 0  MEWS Systolic 0  MEWS Pulse 2  MEWS RR 0  MEWS LOC 0  MEWS Score 2  MEWS Score Color Yellow  Assess: if the MEWS score is Yellow or Red  Were vital signs taken at a resting state? Yes  Focused Assessment No change from prior assessment  Early Detection of Sepsis Score *See Row Information* Low  MEWS guidelines implemented *See Row Information* Yes  Treat  MEWS Interventions Escalated (See documentation below) (provider on call notified)  Pain Scale PAINAD  Pain Score 0  Take Vital Signs  Increase Vital Sign Frequency  Yellow: Q 2hr X 2 then Q 4hr X 2, if remains yellow, continue Q 4hrs  Escalate  MEWS: Escalate Yellow: discuss with charge nurse/RN and consider discussing with provider and RRT  Notify: Charge Nurse/RN  Name of Charge Nurse/RN Notified Jasmine  Date Charge Nurse/RN Notified 05/21/21  Time Charge Nurse/RN Notified 2015  Notify: Provider  Provider Name/Title Andrez Grime  Date Provider Notified 05/21/21  Time Provider Notified 2015  Notification Type Page  Notification Reason Other (Comment) (elevated HR)  Provider response See new orders  Date of Provider Response 05/21/21  Time of Provider Response 2027  Inserted for Bertram Gala RN

## 2021-05-24 NOTE — Progress Notes (Signed)
Pharmacy Antibiotic Note  Rodney Kelly is a 87 y.o. male admitted on 05/19/2021 with diabetic foot infection and osteomyelitis.  Pharmacy has been consulted for cefazolin dosing.  Patient previously on vancomycin and cefepime.  Patient appears to have been on doxycyline prior to admission. Awaiting toe amputation and I&D per podiatry when cleared d/t concerns for aspiration  Today, 05/24/2021 Day #6 antibiotics WBC WNL Afebrile Scr stable PCT < 0.1 x3 MRSA PCR neg 6/15 wound (collected at bedside per podiatry): MSSE 6/15 Bcx NG   Plan: Cefazolin 2gm IV q8h - follow at distance Await plan for surgery   Height: 5\' 9"  (175.3 cm) Weight: 59 kg (130 lb) IBW/kg (Calculated) : 70.7  Temp (24hrs), Avg:98.3 F (36.8 C), Min:98 F (36.7 C), Max:98.7 F (37.1 C)  Recent Labs  Lab 05/19/21 0728 05/20/21 0416 05/21/21 0404 05/21/21 0930 05/22/21 0456 05/23/21 0514 05/24/21 0422  WBC 9.1 7.5  --  10.2 8.1 9.1 7.2  CREATININE 0.80  --  0.63 0.55* 0.59* 0.71 0.76  LATICACIDVEN 1.2  --   --   --   --   --   --     Estimated Creatinine Clearance: 47.1 mL/min (by C-G formula based on SCr of 0.76 mg/dL).    Allergies  Allergen Reactions   Penicillins Other (See Comments)    unknown   Sulfa Antibiotics Other (See Comments)    unknown    Antimicrobials this admission: Aztreonam 6/15 x1 vanco 6/15 >> 6/17 cefepime 6/16 >> 6/20 Cefazolin 6/20 >> Metronidazole 6/17 >>    Thank you for allowing pharmacy to be a part of this patient's care.  7/17, PharmD, BCPS.   Work Cell: [phone] 05/24/2021 11:27 AM

## 2021-05-24 NOTE — Progress Notes (Signed)
NAME: Rodney Kelly  DOB: [birth year]  MRN: [id number]  Date/Time: 05/24/2021 1:39 PM  REQUESTING PROVIDER: Dr.Elgergawy Subjective:  REASON FOR CONSULT: foot infection ?NO history from patient. Chart reviewed Rodney Kelly is a 87 y.o. male with a history of DM, dementia chronic ulcers both feet and followed by Podiatrist Dr.Fowler was referred from his office when he saw him on 05/17/21 for rt foot swelling, erythema and not responding on oral doxycycline. He wanted him to get admitted for MRI and Iv antibiotics and surgery. HE came to the ED on 05/19/21.vitals .9, BP 108/57. HR 112 and RR 18. WBC 9.1, lactate 1.2, cr 0.80. MRI foot showed Small open wound on the plantar aspect of the medial forefoot at the level of the first MTP joint. Associated underlying soft tissue abscess. 2. Associated osteomyelitis involving the medial sesamoid of the great toe and also the proximal phalanx of the great toe. 3. Suspect osteomyelitis involving the first metatarsal head along its plantar aspect. He was started initially on cefepime and vanco- cultures were sent from the wound and it did not have any bacteria except staph epi skin flora- Also the cultures taken in Dr.Fowler's office on 6/6 was negative.  There was a plan to take him for amputation of the great toe on 05/21/21- He apparently aspirated on 6/16 and the surgery was postponed I am seeing the patient for antibiotic recommendation  Past Medical History:  Diagnosis Date   Anginal pain (HCC)    Arthritis    Complication of anesthesia    spinal anesthesia caused nerve damage in 2002   Coronary artery disease    Diabetes mellitus without complication (HCC)    md stopped metformin   Diabetic ulcer of left foot (HCC)    GERD (gastroesophageal reflux disease)    Hyperlipemia    Multiple thyroid nodules    Myocardial infarction (HCC)    STEMI   Osteoporosis    Peripheral nerve disease    Ulceration of colon    NSAIDS    Vitamin D deficiency     Past Surgical History:  Procedure Laterality Date   ABDOMINAL AORTIC ANEURYSM REPAIR  2013   ACHILLES TENDON SURGERY Left 09/28/2018   Procedure: ACHILLES TENDON MICROTENOTOMY-TAL;  Surgeon: Gwyneth Revels, DPM;  Location: [facility] ORS;  Service: Podiatry;  Laterality: Left;   CARPAL TUNNEL RELEASE Right    excision of benign skin lesion     JOINT REPLACEMENT     right knee   SKIN LESION EXCISION     thyroid lobectomy Right    WOUND DEBRIDEMENT Left 09/28/2018   Procedure: DEBRIDEMENT WOUND-SKIN,SUBCUTANEOUS TISSUE;  Surgeon: Gwyneth Revels, DPM;  Location: [facility] OR[facility];  Service: Podiatry;  Laterality: Left;    Social History   Socioeconomic History   Marital status: Widowed    Spouse name: Not on file   Number of children: Not on file   Years of education: Not on file   Highest education level: Not on file  Occupational History   Not on file  Tobacco Use   Smoking status: Former    Pack years: 0.00    Types: Cigarettes    Quit date: 05/24/1992    Years since quitting: 29.0   Smokeless tobacco: Never  Vaping Use   Vaping Use: Never used  Substance and Sexual Activity   Alcohol use: Not Currently   Drug use: Never   Sexual activity: Not on file  Other Topics Concern   Not on file  Social History  Narrative   Not on file   Social Determinants of Health   Financial Resource Strain: Not on file  Food Insecurity: Not on file  Transportation Needs: Not on file  Physical Activity: Not on file  Stress: Not on file  Social Connections: Not on file  Intimate Partner Violence: Not on file    No family history on file. Allergies  Allergen Reactions   Penicillins Other (See Comments)    unknown   Sulfa Antibiotics Other (See Comments)    unknown   I? Current Facility-Administered Medications  Medication Dose Route Frequency Provider Last Rate Last Admin   albuterol (PROVENTIL) (2.5 MG/3ML) 0.083% nebulizer solution 3 mL  3 mL Inhalation Q4H PRN Lorretta HarpNiu,  Xilin, MD   3 mL at 05/21/21 0458   aspirin chewable tablet 81 mg  81 mg Oral Daily Lorretta HarpNiu, Xilin, MD   81 mg at 05/20/21 [id number]   ceFAZolin (ANCEF) IVPB 2g/100 mL premix  2 g Intravenous Q8H Kyarah Enamorado, Rhodia AlbrightJayashree, MD 200 mL/hr at 05/24/21 1337 2 g at 05/24/21 1337   Chlorhexidine Gluconate Cloth 2 % PADS 6 each  6 each Topical Daily Elgergawy, Leana Roeawood S, MD   6 each at 05/24/21 1010   enoxaparin (LOVENOX) injection 40 mg  40 mg Subcutaneous Q24H Elgergawy, Leana Roeawood S, MD   40 mg at 05/23/21 2047   fenofibrate tablet 160 mg  160 mg Oral Daily Lorretta HarpNiu, Xilin, MD   160 mg at 05/20/21 [id number]   HYDROcodone-acetaminophen (NORCO/VICODIN) 5-325 MG per tablet 1 tablet  1 tablet Oral Q6H PRN Lorretta HarpNiu, Xilin, MD       insulin aspart (novoLOG) injection 0-5 Units  0-5 Units Subcutaneous QHS Lorretta HarpNiu, Xilin, MD       insulin aspart (novoLOG) injection 0-9 Units  0-9 Units Subcutaneous TID WC Lorretta HarpNiu, Xilin, MD   2 Units at 05/20/21 1208   isosorbide mononitrate (IMDUR) 24 hr tablet 30 mg  30 mg Oral Daily Lorretta HarpNiu, Xilin, MD   30 mg at 05/20/21 [id number]   lactated ringers infusion   Intravenous Continuous Elgergawy, Leana Roeawood S, MD 100 mL/hr at 05/24/21 0531 New Bag at 05/24/21 0531   metoprolol tartrate (LOPRESSOR) injection 2.5 mg  2.5 mg Intravenous Q6H Elgergawy, Leana Roeawood S, MD   2.5 mg at 05/24/21 1219   metoprolol tartrate (LOPRESSOR) injection 5 mg  5 mg Intravenous Q4H PRN Manuela SchwartzMorrison, Brenda, NP       metroNIDAZOLE (FLAGYL) IVPB 500 mg  500 mg Intravenous Q8H Elgergawy, Leana Roeawood S, MD 100 mL/hr at 05/24/21 0852 500 mg at 05/24/21 0852   mupirocin ointment (BACTROBAN) 2 %   Topical Daily Doroteo Glassmanhompson, Amy C, RPH   Given at 05/24/21 1338   nitroGLYCERIN (NITROSTAT) SL tablet 0.4 mg  0.4 mg Sublingual Q5 min PRN Lorretta HarpNiu, Xilin, MD       ondansetron [facility](ZOFRAN) injection 4 mg  4 mg Intravenous Q8H PRN Lorretta HarpNiu, Xilin, MD       pantoprazole (PROTONIX) injection 40 mg  40 mg Intravenous Q24H Elgergawy, Leana Roeawood S, MD   40 mg at 05/24/21 0846   sodium chloride flush (NS)  0.9 % injection 10 mL  10 mL Intravenous Q12H Lorretta HarpNiu, Xilin, MD   10 mL at 05/23/21 1001   tamsulosin (FLOMAX) capsule 0.4 mg  0.4 mg Oral QPC supper Elgergawy, Leana Roeawood S, MD         Abtx:  Anti-infectives (From admission, onward)    Start     Dose/Rate Route Frequency Ordered Stop   05/24/21 1400  ceFAZolin (  ANCEF) IVPB 2g/100 mL premix        2 g 200 mL/hr over 30 Minutes Intravenous Every 8 hours 05/24/21 1116     05/21/21 1300  vancomycin (VANCOCIN) IVPB 1000 mg/200 mL premix  Status:  Discontinued        1,000 mg 200 mL/hr over 60 Minutes Intravenous Every 24 hours 05/21/21 1238 05/21/21 1620   05/21/21 1030  ceFEPIme (MAXIPIME) 2 g in sodium chloride 0.9 % 100 mL IVPB  Status:  Discontinued        2 g 200 mL/hr over 30 Minutes Intravenous Every 12 hours 05/21/21 0932 05/24/21 1116   05/21/21 1000  metroNIDAZOLE (FLAGYL) IVPB 500 mg        500 mg 100 mL/hr over 60 Minutes Intravenous Every 8 hours 05/21/21 0914     05/20/21 1100  vancomycin (VANCOCIN) IVPB 1000 mg/200 mL premix  Status:  Discontinued        1,000 mg 200 mL/hr over 60 Minutes Intravenous Every 24 hours 05/19/21 1033 05/21/21 1238   05/19/21 0930  vancomycin (VANCOREADY) IVPB 1250 mg/250 mL        1,250 mg 166.7 mL/hr over 90 Minutes Intravenous  Once 05/19/21 0845 05/19/21 1231   05/19/21 0900  aztreonam (AZACTAM) 2 g in sodium chloride 0.9 % 100 mL IVPB        2 g 200 mL/hr over 30 Minutes Intravenous  Once 05/19/21 0845 05/19/21 1037       REVIEW OF SYSTEMS: NA Objective:  VITALS:  BP 137/75 (BP Location: Left Arm)   Pulse 80   Temp 98.7 F (37.1 C)   Resp 18   Ht  (1.753 m)   Wt 59 kg   SpO2 97%   BMI 19.20 kg/m  PHYSICAL EXAM:  General: somnolent- as per nurse at bed side he was awake this morning   Lungs: b/l air entry - few rhonchi- he tries to clear his throat Heart: s1s2 Abdomen: Soft, non-tender,not distended. Bowel sounds normal. No masses Extremities:   Swelling, erythema not  present Ulcer over the 1st MTP on plantar aspect Skin: No rashes or lesions. Or bruising Lymph: Cervical, supraclavicular normal. Neurologic: did not examine Pertinent Labs Lab Results CBC    Component Value Date/Time   WBC 7.2 05/24/2021 0422   RBC 3.71 (L) 05/24/2021 0422   HGB 10.5 (L) 05/24/2021 0422   HCT 32.0 (L) 05/24/2021 0422   PLT 376 05/24/2021 0422   MCV 86.3 05/24/2021 0422   MCH 28.3 05/24/2021 0422   MCHC 32.8 05/24/2021 0422   RDW 15.0 05/24/2021 0422   LYMPHSABS 1.3 05/19/2021 0728   MONOABS 0.8 05/19/2021 0728   EOSABS 0.2 05/19/2021 0728   BASOSABS 0.1 05/19/2021 0728    CMP Latest Ref Rng & Units 05/24/2021 05/23/2021 05/22/2021  Glucose 70 - 99 mg/dL 161(W) 960(A) 540(J)  BUN 8 - 23 mg/dL Creatinine 0.61 - 1.24 mg/dL 8.11 9.14 7.82(N)  Sodium 135 - 145 mmol/L 139 139 137  Potassium 3.5 - 5.1 mmol/L 3.5 3.5 3.4(L)  Chloride 98 - 111 mmol/L 104 105 107  CO2 22 - 32 mmol/L Calcium 8.9 - 10.3 mg/dL 5.6(O) 9.2 9.1  Total Protein 6.5 - 8.1 g/dL - - -  Total Bilirubin 0.3 - 1.2 mg/dL - - -  Alkaline Phos 38 - 126 U/L - - -  AST 15 - 41 U/L - - -  ALT 0 - 44 U/L - - -  Microbiology: Recent Results (from the past 240 hour(s))  Blood Cultures x 2 sites     Status: None (Preliminary result)   Collection Time: 05/19/21  9:35 AM   Specimen: BLOOD  Result Value Ref Range Status   Specimen Description BLOOD LEFT AC  Final   Special Requests   Final    BOTTLES DRAWN AEROBIC AND ANAEROBIC Blood Culture results may not be optimal due to an excessive volume of blood received in culture bottles   Culture   Final    NO GROWTH 4 DAYS Performed at [facility], [address], [place], [place] [address]    Report Status PENDING  Incomplete  Blood Cultures x 2 sites     Status: None (Preliminary result)   Collection Time: 05/19/21  9:35 AM   Specimen: BLOOD  Result Value Ref Range Status   Specimen Description BLOOD  RIGHT [facility]   Final   Special Requests   Final    BOTTLES DRAWN AEROBIC AND ANAEROBIC Blood Culture adequate volume   Culture   Final    NO GROWTH 4 DAYS Performed at [facility], [address], [place], [place] [address]    Report Status PENDING  Incomplete  SARS CORONAVIRUS 2 (TAT 6-24 HRS) Nasopharyngeal Nasopharyngeal Swab     Status: None   Collection Time: 05/19/21  9:35 AM   Specimen: Nasopharyngeal Swab  Result Value Ref Range Status   SARS Coronavirus 2 NEGATIVE NEGATIVE Final    Comment: (NOTE) SARS-CoV-2 target nucleic acids are NOT DETECTED.  The SARS-CoV-2 RNA is generally detectable in upper and lower respiratory specimens during the acute phase of infection. Negative results do not preclude SARS-CoV-2 infection, do not rule out co-infections with other pathogens, and should not be used as the sole basis for treatment or other patient management decisions. Negative results must be combined with clinical observations, patient history, and epidemiological information. The expected result is Negative.  Fact Sheet for Patients: HairSlick.no  Fact Sheet for Healthcare Providers: quierodirigir.com  This test is not yet approved or cleared by the [place] FDA and  has been authorized for detection and/or diagnosis of SARS-CoV-2 by FDA under an Emergency Use Authorization (EUA). This EUA will remain  in effect (meaning this test can be used) for the duration of the COVID-19 declaration under Se ction 564(b)(1) of the Act, 21 U.S.C. section 360bbb-3(b)(1), unless the authorization is terminated or revoked sooner.  Performed at [facility], [address] [place], [place] [address]   Aerobic/Anaerobic Culture w Gram Stain (surgical/deep wound)     Status: None (Preliminary result)   Collection Time: 05/19/21 12:36 PM   Specimen: Wound  Result Value Ref Range Status   Specimen Description   Final     WOUND Performed at [facility], [address] [place], [place] [address]    Special Requests   Final    NONE Performed at [facility], [address] [place], [place] [address]    Gram Stain   Final    RARE WBC PRESENT, PREDOMINANTLY MONONUCLEAR RARE GRAM POSITIVE COCCI Performed at [facility], [address] [place], [place] [address]    Culture   Final    RARE STAPHYLOCOCCUS EPIDERMIDIS NO ANAEROBES ISOLATED; CULTURE IN PROGRESS FOR 5 DAYS    Report Status PENDING  Incomplete   Organism ID, Bacteria STAPHYLOCOCCUS EPIDERMIDIS  Final      Susceptibility   Staphylococcus epidermidis - MIC*  CIPROFLOXACIN <=0.5 SENSITIVE Sensitive     ERYTHROMYCIN >=8 RESISTANT Resistant     GENTAMICIN <=0.5 SENSITIVE Sensitive     OXACILLIN <=0.25 SENSITIVE Sensitive     TETRACYCLINE >=16 RESISTANT Resistant     VANCOMYCIN 1 SENSITIVE Sensitive     TRIMETH/SULFA <=10 SENSITIVE Sensitive     CLINDAMYCIN <=0.25 SENSITIVE Sensitive     RIFAMPIN <=0.5 SENSITIVE Sensitive     Inducible Clindamycin NEGATIVE Sensitive     * RARE STAPHYLOCOCCUS EPIDERMIDIS  MRSA PCR Screening     Status: None   Collection Time: 05/20/21  3:50 PM  Result Value Ref Range Status   MRSA by PCR NEGATIVE NEGATIVE Final    Comment:        The GeneXpert MRSA Assay (FDA approved for NASAL specimens only), is one component of a comprehensive MRSA colonization surveillance program. It is not intended to diagnose MRSA infection nor to guide or monitor treatment for MRSA infections. Performed at [facility], [address] [place], [place] [address]     IMAGING RESULTS:  I have personally reviewed the films ?Small open wound on the plantar aspect of the medial forefoot at the level of the first MTP joint. Associated underlying soft tissue abscess. 2. Associated osteomyelitis involving the medial sesamoid of the great toe and also the proximal phalanx of the great  toe. 3. Suspect osteomyelitis involving the first metatarsal head along its plantar aspect. 4. Diffuse cellulitis and myofasciitis without definite findings pyomyositis. Impression/Recommendation ? Chronic foot ulcer- rt with underlying sesamoid osteo Culture neg an it does not ook overtly infected- He was been on broad spectrum antibiotics inspite of negative wound culture Change vanco/cefepime to cefazolin.  Encephalopathy-avoid cefepime in elderly  Aspiration pneumonitis is questioned- on cefazolin+ flagyl   ? ? ___________________________________________________ Discussed with his nurse and  requesting provider Note:  This document was prepared using Dragon voice recognition software and may include unintentional dictation errors.

## 2021-05-24 NOTE — TOC Progression Note (Signed)
Transition of Care [facility]) - Progression Note    Patient Details  Name: Rodney Kelly MRN: [id number] Date of Birth: January 17, 1924  [facility] CM/SW Contact  Caryn Section, RN Phone Number: 05/24/2021, 11:59 AM  Clinical Narrative:   Patient scheduled for surgery will await surgery and following eval to assist with disposition.    Expected Discharge Plan: Home w Home Health Services Barriers to Discharge: Continued Medical Work up  Expected Discharge Plan and Services Expected Discharge Plan: Home w Home Health Services   Discharge Planning Services: CM Consult Post Acute Care Choice: Home Health                                         Social Determinants of Health (SDOH) Interventions    Readmission Risk Interventions No flowsheet data found.

## 2021-05-24 NOTE — Progress Notes (Signed)
OT Cancellation Note  Patient Details Name: Rodney Kelly MRN: [id number] DOB: 1924/03/10   Cancelled Treatment:    Reason Eval/Treat Not Completed: Patient not medically ready. Pt scheduled for first ray amputation and likely debridement of the ulcer on the left heel, per podiatry. Will also need b/l post-op shoes. Awaiting further orders/recs post op.   Latina Craver 05/24/2021, 12:08 PM

## 2021-05-24 NOTE — Progress Notes (Signed)
PROGRESS NOTE    Rodney Kelly  EXB:[id number] DOB: [birth year] DOA: 05/19/2021 PCP: Wilkie Aye, MD    Chief Complaint  Patient presents with   Wound Infection    Brief Narrative:   87 y.o. male with medical history significant of DM, HLD, PCD, GERD, CAD, MI, diabetic foot ulcer, dementia who presents with foot ulcer with infection, work-up significant for osteomyelitis, patient was seen by vascular surgery, no further work-up from their side, he has been followed by podiatry.  MRI significant for osteomyelitis, patient with severe hospital delirium, on 6/17 early a.m., patient with hypoxia, new oxygen requirement and chest x-ray showing aspiration in right lung base.  Hospital course complicated by severe delirium, causing severe dysphagia, where he is strictly n.p.o. even for medications.  Assessment & Plan:   Principal Problem:   Diabetic foot infection (HCC) Active Problems:   Coronary artery disease   Type 2 diabetes mellitus with complication, without long-term current use of insulin (HCC)   Diabetic foot ulcers (HCC)   Hyperlipemia   GERD (gastroesophageal reflux disease)   Normocytic anemia   Cellulitis of right lower extremity   Right foot, cellulitis/myofasciitis with abscess,  osteomyelitis right great toe and first metatarsal bone. - Patient failed outpatient oral antibiotic doxycycline treatment.  -  Patient has tachycardia, but no fever, tachypnea or leukocytosis.  Clinically not septic.  Lactic acid normal 1.2. Dr. Excell Seltzer of podiatry and Dr. Wyn Quaker of [facility] are consulted. - Vascular surgery input greatly appreciated, no further work-up currently, outpatient follow-up. -No evidence of DVT on venous Dopplers -Podiatry input greatly appreciated, plan for right partial first ray amputation with left heel wound debridement, initial plan was for Friday, his hospital delirium, and aspiration, this has postponed pending clinical improvement . -Initially on  vancomycin, it has been stopped given wound culture growing MSSA, as well he is MRSA screen PCR negative, and procalcitonin <10.  He is on cefepime and Flagyl currently, will narrow to cefazolin/Flagyl, ID to follow for further recommendations.  Acute toxic encephalopathy/hospital delirium -Patient with severe hospital delirium, he is having a bedside sitter, as needed Haldol as well, trying to minimize narcotics and benzo diazepam, increased frequency of reorientation, trying to correct day/night cycle. -Trying to to avoid Ativan, this leaves me only with haloperidol as needed -Continue with as needed Haldol, patient has a sitter, have discussed with son, encouraged him to have him or family member stay next to patient to have familiar faces. -No acute findings  Aspiration pneumonia -Chest x-ray significant for right lung base opacity, he was started on cefepime and Flagyl and insulin allergy.   -He remains with significant dysphagia, and aspiration on his own secretions, SLP keep following, he remains NPO.  Sinus tachycardia -Overnight patient heart rate in the 130s, one of the EKG is showing A. fib, cardiology Dr. Richardson Dopp would reviewed the EKG, doubt it is A. fib, most likely sinus tachycardia with PACs versus SVT, I could not appreciate any significant A. fib on his telemetry strip, he is on metoprolol 2.5 mg every 6 hours IV (given his n.p.o.), at this point I see no indication for full anticoagulation as cannot confirm A. fib.   Hypokalemia -Replete as needed.   Coronary artery disease:  -No CP. S/p of stent per his son -ASA, tricor, Imdur -prn NGT  Elevated D-dimers -Lower extremity Dopplers negative for DVT, continue with Lovenox 40 mg subcu daily.  Urinary retention -Foley catheter inserted 6/17,  started on Flomax.   Type  2 diabetes mellitus with complication of foot ulcer, without long-term current use of insulin (HCC): Recent A1c 6.4, well controlled.  Patient taking metformin  at home -Sliding scale insulin   Hyperlipemia -Fenofibrate -Patient is not taking Lipitor currently   GERD (gastroesophageal reflux disease) -Protonix   Normocytic anemia:  -Plan, this is most likely anemia of chronic disease    Son requested patient to be transferred to [facility], I have called [facility] to requested transfer on 6/19, have discussed with internist on-call Dr. Seth Bake, given there is no indication for transfer there, and all services available at our facility, they have declined transfer especially they are at capacity, I have updated the son at  DVT prophylaxis: Heparin Code Status: Full Family Communication: Discussed with son at bedside multiple times every day.    Disposition:   Status is: Inpatient  Remains inpatient appropriate because:IV treatments appropriate due to intensity of illness or inability to take PO  Dispo: The patient is from: Home              Anticipated d/c is to: Home              Patient currently is not medically stable to d/c.   Difficult to place patient No       Consultants:  Podiatry Vascular surgery  Subjective: Patient remains confused, restless, seen by SLP this morning and failed swallow evaluation.  No significant A. fib or arrhythmias on telemetry.  Objective: Vitals:   05/23/21 1948 05/24/21 0101 05/24/21 0442 05/24/21 0811  BP: 137/90 (!) 160/83 (!) 157/74 137/75  Pulse: 77 (!) 110 90 80  Resp: Temp: 98.3 F (36.8 C)  98.5 F (36.9 C) 98.7 F (37.1 C)  TempSrc: Oral  Oral   SpO2: 99%  95% 97%  Weight:      Height:        Intake/Output Summary (Last 24 hours) at 05/24/2021 1328 Last data filed at 05/24/2021 1200 Gross per 24 hour  Intake 750.89 ml  Output 2900 ml  Net -2149.11 ml    Filed Weights   05/19/21 0722  Weight: 59 kg    Examination:  Patient is frail, altered, confused, in no apparent distress  Symmetrical Chest wall movement, fair air entry bilaterally, diminished at the  bases. RRR,No Gallops,Rubs or new Murmurs, No Parasternal Heave +ve B.Sounds, Abd Soft, No tenderness, No rebound - guarding or rigidity. Both feet bandaged with Ace wrapped.    Data Reviewed: I have personally reviewed following labs and imaging studies  CBC: Recent Labs  Lab 05/19/21 0728 05/20/21 0416 05/21/21 0930 05/22/21 0456 05/23/21 0514 05/24/21 0422  WBC 9.1 7.5 10.2 8.1 9.1 7.2  NEUTROABS 6.8  --   --   --   --   --   HGB 10.5* 9.7* 10.4* 9.5* 10.8* 10.5*  HCT 32.7* 29.6* 32.1* 29.6* 32.9* 32.0*  MCV 88.9 86.0 87.7 87.1 85.9 86.3  PLT 323 281 337 333 371 376    Basic Metabolic Panel: Recent Labs  Lab 05/19/21 0728 05/21/21 0404 05/21/21 0930 05/22/21 0456 05/22/21 2336 05/23/21 0514 05/24/21 0422  NA 139  --  136 137  --  139 139  K 3.8  --  3.6 3.4*  --  3.5 3.5  CL 102  --  103 107  --  105 104  CO2 27  --  27 24  --  23 24  GLUCOSE 200*  --  144* 126*  --  123* 108*  BUN 18  --  15 12  --  13 14  CREATININE 0.80 0.63 0.55* 0.59*  --  0.71 0.76  CALCIUM 9.5  --  9.2 9.1  --  9.2 8.8*  MG  --   --   --   --  1.7  --   --   PHOS  --   --   --   --   --  2.5  --     GFR: Estimated Creatinine Clearance: 47.1 mL/min (by C-G formula based on SCr of 0.76 mg/dL).  Liver Function Tests: Recent Labs  Lab 05/19/21 0728  AST 12*  ALT 12  ALKPHOS 74  BILITOT 0.6  PROT 7.3  ALBUMIN 3.1*    CBG: Recent Labs  Lab 05/23/21 1148 05/23/21 1631 05/23/21 2043 05/24/21 0736 05/24/21 1141  GLUCAP 105* 118* 128* 109* 99     Recent Results (from the past 240 hour(s))  Blood Cultures x 2 sites     Status: None (Preliminary result)   Collection Time: 05/19/21  9:35 AM   Specimen: BLOOD  Result Value Ref Range Status   Specimen Description BLOOD LEFT AC  Final   Special Requests   Final    BOTTLES DRAWN AEROBIC AND ANAEROBIC Blood Culture results may not be optimal due to an excessive volume of blood received in culture bottles   Culture   Final     NO GROWTH 4 DAYS Performed at [facility], [address], [place], [place] [address]    Report Status PENDING  Incomplete  Blood Cultures x 2 sites     Status: None (Preliminary result)   Collection Time: 05/19/21  9:35 AM   Specimen: BLOOD  Result Value Ref Range Status   Specimen Description BLOOD  RIGHT [facility]  Final   Special Requests   Final    BOTTLES DRAWN AEROBIC AND ANAEROBIC Blood Culture adequate volume   Culture   Final    NO GROWTH 4 DAYS Performed at [facility], [address] [place], [place] [address]    Report Status PENDING  Incomplete  SARS CORONAVIRUS 2 (TAT 6-24 HRS) Nasopharyngeal Nasopharyngeal Swab     Status: None   Collection Time: 05/19/21  9:35 AM   Specimen: Nasopharyngeal Swab  Result Value Ref Range Status   SARS Coronavirus 2 NEGATIVE NEGATIVE Final    Comment: (NOTE) SARS-CoV-2 target nucleic acids are NOT DETECTED.  The SARS-CoV-2 RNA is generally detectable in upper and lower respiratory specimens during the acute phase of infection. Negative results do not preclude SARS-CoV-2 infection, do not rule out co-infections with other pathogens, and should not be used as the sole basis for treatment or other patient management decisions. Negative results must be combined with clinical observations, patient history, and epidemiological information. The expected result is Negative.  Fact Sheet for Patients: HairSlick.no  Fact Sheet for Healthcare Providers: quierodirigir.com  This test is not yet approved or cleared by the [place] FDA and  has been authorized for detection and/or diagnosis of SARS-CoV-2 by FDA under an Emergency Use Authorization (EUA). This EUA will remain  in effect (meaning this test can be used) for the duration of the COVID-19 declaration under Se ction 564(b)(1) of the Act, 21 U.S.C. section 360bbb-3(b)(1), unless the authorization is terminated  or revoked sooner.  Performed at [facility], [address] [place], [place] [address]   Aerobic/Anaerobic Culture w Gram Stain (surgical/deep wound)  Status: None (Preliminary result)   Collection Time: 05/19/21 12:36 PM   Specimen: Wound  Result Value Ref Range Status   Specimen Description   Final    WOUND Performed at [facility], [address] [place], [place] [address]    Special Requests   Final    NONE Performed at [facility], [address] [place], [place] [address]    Gram Stain   Final    RARE WBC PRESENT, PREDOMINANTLY MONONUCLEAR RARE GRAM POSITIVE COCCI Performed at [facility], [address] [place], [place] [address]    Culture   Final    RARE STAPHYLOCOCCUS EPIDERMIDIS NO ANAEROBES ISOLATED; CULTURE IN PROGRESS FOR 5 DAYS    Report Status PENDING  Incomplete   Organism ID, Bacteria STAPHYLOCOCCUS EPIDERMIDIS  Final      Susceptibility   Staphylococcus epidermidis - MIC*    CIPROFLOXACIN <=0.5 SENSITIVE Sensitive     ERYTHROMYCIN >=8 RESISTANT Resistant     GENTAMICIN <=0.5 SENSITIVE Sensitive     OXACILLIN <=0.25 SENSITIVE Sensitive     TETRACYCLINE >=16 RESISTANT Resistant     VANCOMYCIN 1 SENSITIVE Sensitive     TRIMETH/SULFA <=10 SENSITIVE Sensitive     CLINDAMYCIN <=0.25 SENSITIVE Sensitive     RIFAMPIN <=0.5 SENSITIVE Sensitive     Inducible Clindamycin NEGATIVE Sensitive     * RARE STAPHYLOCOCCUS EPIDERMIDIS  MRSA PCR Screening     Status: None   Collection Time: 05/20/21  3:50 PM  Result Value Ref Range Status   MRSA by PCR NEGATIVE NEGATIVE Final    Comment:        The GeneXpert MRSA Assay (FDA approved for NASAL specimens only), is one component of a comprehensive MRSA colonization surveillance program. It is not intended to diagnose MRSA infection nor to guide or monitor treatment for MRSA infections. Performed at [facility], [address] [place], [place] [address]           Radiology Studies: CT HEAD WO CONTRAST  Result Date: 05/22/2021 CLINICAL DATA:  Delirium. EXAM: CT HEAD WITHOUT CONTRAST TECHNIQUE: Contiguous axial images were obtained from the base of the skull through the vertex without intravenous contrast. COMPARISON:  None FINDINGS: Brain: Mild atrophy and white matter changes are within normal limits for age. No acute infarct, hemorrhage, or mass lesion is present. The ventricles are of normal size. No significant extraaxial fluid collection is present. The brainstem and cerebellum are within normal limits. Vascular: Atherosclerotic calcifications present within the cavernous internal carotid arteries bilaterally. No hyperdense vessel is present. Skull: Calvarium is intact. No focal lytic or blastic lesions are present. No significant extracranial soft tissue lesion is present. Sinuses/Orbits: The paranasal sinuses and mastoid air cells are clear. Bilateral lens replacements are noted. Globes and orbits are otherwise unremarkable. IMPRESSION: Normal CT appearance of the brain for age. No acute or focal abnormality to explain the patient's delirium. Electronically Signed   By: Marin Roberts M.D.   On: 05/22/2021 14:53   US Venous Img Lower Unilateral Left (DVT)  Result Date: 05/24/2021 CLINICAL DATA:  Elevated D-dimer.  Left lower extremity edema. EXAM: LEFT LOWER EXTREMITY VENOUS DOPPLER ULTRASOUND TECHNIQUE: Gray-scale sonography with graded compression, as well as color Doppler and duplex ultrasound were performed to evaluate the lower extremity deep venous systems from the level of the common femoral vein and including the common femoral, femoral, profunda femoral, popliteal and calf veins including the posterior tibial, peroneal and gastrocnemius veins when visible. The  superficial great saphenous vein was also interrogated. Spectral Doppler was utilized to evaluate flow at rest and with distal augmentation maneuvers in the common femoral,  femoral and popliteal veins. COMPARISON:  None. FINDINGS: Contralateral Common Femoral Vein: Respiratory phasicity is normal and symmetric with the symptomatic side. No evidence of thrombus. Normal compressibility. Common Femoral Vein: No evidence of thrombus. Normal compressibility, respiratory phasicity and response to augmentation. Saphenofemoral Junction: No evidence of thrombus. Normal compressibility and flow on color Doppler imaging. Profunda Femoral Vein: No evidence of thrombus. Normal compressibility and flow on color Doppler imaging. Femoral Vein: No evidence of thrombus. Normal compressibility, respiratory phasicity and response to augmentation. Popliteal Vein: No evidence of thrombus. Normal compressibility, respiratory phasicity and response to augmentation. Calf Veins: No evidence of thrombus. Normal compressibility and flow on color Doppler imaging. Other Findings:  None. IMPRESSION: Negative for deep venous thrombosis in left lower extremity. Electronically Signed   By: Richarda OverlieAdam  Henn M.D.   On: 05/24/2021 07:56   ECHOCARDIOGRAM COMPLETE  Result Date: 05/23/2021    ECHOCARDIOGRAM REPORT   Patient Name:   Moise BoringCHARLES LINDBERG Mao Date of Exam: 05/23/2021 Medical Rec #:  [id number]                Height:       69.0 in Accession #:    [id number]               Weight:       130.0 lb Date of Birth:  [birth year]                BSA:          1.720 m Patient Age:    89 years                 BP:           108/57 mmHg Patient Gender: M                        HR:           112 bpm. Exam Location:  [facility] Procedure: 2D Echo Indications:     Atrial Fibrillation  History:         Patient has no prior history of Echocardiogram examinations.                  CAD.  Sonographer:     L. Thornton-Maynard Referring Phys:  [id number] BRENDA MORRISON Diagnosing Phys: Alwyn Peawayne D Callwood MD  Sonographer Comments: Test terminated due to pt's dementia state combativeness. IMPRESSIONS  1. Left ventricular ejection fraction, by  estimation, is 60 to 65%. The left ventricle has normal function. The left ventricle has no regional wall motion abnormalities. Left ventricular diastolic parameters were normal.  2. Right ventricular systolic function is normal. The right ventricular size is normal. There is normal pulmonary artery systolic pressure.  3. The mitral valve is normal in structure. No evidence of mitral valve regurgitation.  4. The aortic valve is normal in structure. Aortic valve regurgitation is not visualized. FINDINGS  Left Ventricle: Left ventricular ejection fraction, by estimation, is 60 to 65%. The left ventricle has normal function. The left ventricle has no regional wall motion abnormalities. The left ventricular internal cavity size was normal in size. There is  no left ventricular hypertrophy. Left ventricular diastolic parameters were normal. Right Ventricle: The right ventricular size is normal. No increase in right ventricular wall thickness. Right ventricular systolic function is normal. There  is normal pulmonary artery systolic pressure. The tricuspid regurgitant velocity is 2.81 m/s, and  with an assumed right atrial pressure of 3 mmHg, the estimated right ventricular systolic pressure is 34.6 mmHg. Left Atrium: Left atrial size was normal in size. Right Atrium: Right atrial size was normal in size. Pericardium: There is no evidence of pericardial effusion. Mitral Valve: The mitral valve is normal in structure. No evidence of mitral valve regurgitation. Tricuspid Valve: The tricuspid valve is normal in structure. Tricuspid valve regurgitation is not demonstrated. Aortic Valve: The aortic valve is normal in structure. Aortic valve regurgitation is not visualized. Aortic valve mean gradient measures 3.0 mmHg. Aortic valve peak gradient measures 5.1 mmHg. Aortic valve area, by VTI measures 3.46 cm. Pulmonic Valve: The pulmonic valve was normal in structure. Pulmonic valve regurgitation is not visualized. Aorta: The  ascending aorta was not well visualized. IAS/Shunts: No atrial level shunt detected by color flow Doppler.  LEFT VENTRICLE PLAX 2D LVIDd:         3.90 cm  Diastology LVIDs:         2.60 cm  LV e' medial:    5.11 cm/s LV PW:         0.70 cm  LV E/e' medial:  16.5 LV IVS:        1.60 cm  LV e' lateral:   6.09 cm/s LVOT diam:     2.00 cm  LV E/e' lateral: 13.9 LV SV:         68 LV SV Index:   39 LVOT Area:     3.14 cm  RIGHT VENTRICLE RV S prime:     15.90 cm/s TAPSE (M-mode): 2.8 cm LEFT ATRIUM           Index LA diam:      3.10 cm 1.80 cm/m LA Vol (A2C): 21.3 ml 12.38 ml/m LA Vol (A4C): 25.0 ml 14.53 ml/m  AORTIC VALVE                   PULMONIC VALVE AV Area (Vmax):    3.22 cm    PV Vmax:       1.03 m/s AV Area (Vmean):   3.20 cm    PV Peak grad:  4.2 mmHg AV Area (VTI):     3.46 cm AV Vmax:           113.00 cm/s AV Vmean:          73.800 cm/s AV VTI:            0.196 m AV Peak Grad:      5.1 mmHg AV Mean Grad:      3.0 mmHg LVOT Vmax:         116.00 cm/s LVOT Vmean:        75.200 cm/s LVOT VTI:          0.216 m LVOT/AV VTI ratio: 1.10  AORTA Ao Root diam: 3.60 cm MV E velocity: 84.40 cm/s   TRICUSPID VALVE MV A velocity: 114.00 cm/s  TR Peak grad:   31.6 mmHg MV E/A ratio:  0.74         TR Vmax:        281.00 cm/s                              SHUNTS  Systemic VTI:  0.22 m                             Systemic Diam: 2.00 cm Alwyn Pea MD Electronically signed by Alwyn Pea MD Signature Date/Time: 05/23/2021/12:11:18 PM    Final         Scheduled Meds:  aspirin  81 mg Oral Daily   Chlorhexidine Gluconate Cloth  6 each Topical Daily   enoxaparin (LOVENOX) injection  40 mg Subcutaneous Q24H   fenofibrate  160 mg Oral Daily   insulin aspart  0-5 Units Subcutaneous QHS   insulin aspart  0-9 Units Subcutaneous TID WC   isosorbide mononitrate  30 mg Oral Daily   metoprolol tartrate  2.5 mg Intravenous Q6H   mupirocin ointment   Topical Daily   pantoprazole  (PROTONIX) IV  40 mg Intravenous Q24H   sodium chloride flush  10 mL Intravenous Q12H   tamsulosin  0.4 mg Oral QPC supper   Continuous Infusions:   ceFAZolin (ANCEF) IV     lactated ringers 100 mL/hr at 05/24/21 0531   metronidazole 500 mg (05/24/21 0852)     LOS: 5 days       Huey Bienenstock, MD [facility]   To contact the attending provider between 7A-7P or the covering provider during after hours 7P-7A, please log into the web site [email or web address] and access using universal [place] password for that web site. If you do not have the password, please call the hospital operator.  05/24/2021, 1:28 PM

## 2021-05-24 NOTE — Evaluation (Signed)
Physical Therapy Evaluation Patient Details Name: Rodney Kelly MRN: [id number] DOB: 03/18/1924 Today's Date: 05/24/2021   History of Present Illness  Per MD notes: Pt is a 87 y.o. male with medical history significant of DM, HLD, PCD, GERD, CAD, MI, diabetic foot ulcer, dementia who presents with foot ulcer with infection, work-up significant for osteomyelitis, patient was seen by vascular surgery, no further work-up from their side, he has been followed by podiatry.  MRI significant for osteomyelitis, patient with severe hospital delirium, on 6/17 early a.m., patient with hypoxia, new oxygen requirement and chest x-ray showing aspiration in right lung base.  Hospital course complicated by severe delirium causing severe dysphagia, where he is strictly n.p.o. even for medications.  MD assessment summary includes: Right foot cellulitis/myofasciitis with abscess, osteomyelitis right great toe and first metatarsal bone, acute toxic encephalopathy/hospital delirium, aspiration pneumonia, sinus tachycardia, hypokalemia, elevated D-dimers with (-) LE dopplers, and urinary retention.   Clinical Impression  Pt A&O x 0 but was able to follow simple commands with extra time and cuing. Pt required physical assistance with all functional tasks but did put forth good effort throughout.  Pt was somewhat unsteady in standing with difficulty fully extending bilateral knees and required occasional Mod A for stability.  Pt's SpO2 and HR were both WNL during the session with pt demonstrating no signs of distress.  Pt presents with significant functional deficits compared to his baseline and is at a very high risk for falls.  Pt will benefit from PT services in a SNF setting upon discharge to safely address deficits listed in patient problem list for decreased caregiver assistance and eventual return to PLOF.     Follow Up Recommendations SNF    Equipment Recommendations  None recommended by PT     Recommendations for Other Services       Precautions / Restrictions Precautions Precautions: Fall Restrictions Weight Bearing Restrictions: Yes RLE Weight Bearing: Weight bearing as tolerated LLE Weight Bearing: Weight bearing as tolerated Other Position/Activity Restrictions: BLE WBAT with post-op shoes donned per Dr. Excell Seltzer      Mobility  Bed Mobility Overal bed mobility: Needs Assistance Bed Mobility: Supine to Sit     Supine to sit: Max assist     General bed mobility comments: max A for BLE and trunk control    Transfers Overall transfer level: Needs assistance Equipment used: Rolling walker (2 wheeled) Transfers: Sit to/from Stand Sit to Stand: Max assist         General transfer comment: Mod verbal cues for sequencing  Ambulation/Gait Ambulation/Gait assistance: Mod assist Gait Distance (Feet): 4 Feet Assistive device: Rolling walker (2 wheeled) Gait Pattern/deviations: Step-through pattern;Decreased step length - right;Decreased step length - left;Trunk flexed Gait velocity: decreased   General Gait Details: Pt able to take several steps at EOB and then from bed to recliner with very short, effortful steps and occasional Mod A to prevent posterior LOB  Stairs            Wheelchair Mobility    Modified Rankin (Stroke Patients Only)       Balance Overall balance assessment: Needs assistance   Sitting balance-Leahy Scale: Fair     Standing balance support: Bilateral upper extremity supported;During functional activity Standing balance-Leahy Scale: Poor Standing balance comment: Occasional Mod A to prevent LOB in standing                             Pertinent Vitals/Pain  Pain Assessment: No/denies pain    Home Living Family/patient expects to be discharged to:: Private residence Living Arrangements: Children Available Help at Discharge: Family;Available 24 hours/day Type of Home: House Home Access: Stairs to  enter Entrance Stairs-Rails: Right;Left;Can reach both Entrance Stairs-Number of Steps: 4 Home Layout: One level Home Equipment: Grab bars - tub/shower;Grab bars - toilet;Walker - 2 wheels;Cane - quad Additional Comments: Pt lives with his son and son's wife. History obtained from pt's son via phone call secondary to pt's cognitive deficits    Prior Function Level of Independence: Needs assistance   Gait / Transfers Assistance Needed: Mod Ind amb with a QC limited community distances, no fall history  ADL's / Homemaking Assistance Needed: Family assists occasionally with ADLs although pt can do most of his dressing and bathing        Hand Dominance        Extremity/Trunk Assessment   Upper Extremity Assessment Upper Extremity Assessment: Generalized weakness    Lower Extremity Assessment Lower Extremity Assessment: Generalized weakness       Communication      Cognition Arousal/Alertness: Awake/alert Behavior During Therapy: Flat affect Overall Cognitive Status: No family/caregiver present to determine baseline cognitive functioning                                 General Comments: Pt A&O x 0 but able to follow simple 1-step commands with extra time and cuing      General Comments      Exercises Other Exercises Other Exercises: Multiple sit to/from stand transfer training from various height surfaces Other Exercises: Static sitting at EOB for improved activity tolerance and core strength   Assessment/Plan    PT Assessment Patient needs continued PT services  PT Problem List Decreased strength;Decreased activity tolerance;Decreased balance;Decreased mobility;Decreased knowledge of use of DME       PT Treatment Interventions DME instruction;Gait training;Stair training;Functional mobility training;Therapeutic activities;Therapeutic exercise;Balance training;Patient/family education    PT Goals (Current goals can be found in the Care Plan section)   Acute Rehab PT Goals PT Goal Formulation: Patient unable to participate in goal setting Time For Goal Achievement: 06/06/21 Potential to Achieve Goals: Fair    Frequency Min 2X/week   Barriers to discharge Inaccessible home environment      Co-evaluation               AM-PAC PT "6 Clicks" Mobility  Outcome Measure Help needed turning from your back to your side while in a flat bed without using bedrails?: A Lot Help needed moving from lying on your back to sitting on the side of a flat bed without using bedrails?: A Lot Help needed moving to and from a bed to a chair (including a wheelchair)?: A Lot Help needed standing up from a chair using your arms (e.g., wheelchair or bedside chair)?: A Lot Help needed to walk in hospital room?: A Lot Help needed climbing 3-5 steps with a railing? : Total 6 Click Score: 11    End of Session Equipment Utilized During Treatment: Gait belt Activity Tolerance: Patient tolerated treatment well Patient left: in chair;with nursing/sitter in room Psychiatrist present) Nurse Communication: Mobility status PT Visit Diagnosis: Unsteadiness on feet (R26.81);Difficulty in walking, not elsewhere classified (R26.2);Muscle weakness (generalized) (M62.81)    Time: 1610-9604 PT Time Calculation (min) (ACUTE ONLY): 24 min   Charges:   PT Evaluation $PT Eval Moderate Complexity: 1 Mod PT Treatments $  Therapeutic Activity: 8-22 mins       D. Elly Modena PT, DPT 05/24/21, 5:05 PM

## 2021-05-24 NOTE — Progress Notes (Signed)
Patient tolerated puree dinner with honey thick fluids without difficulty. Patient was in chair and alert throughout being fed by this nurse.

## 2021-05-25 DIAGNOSIS — M869 Osteomyelitis, unspecified: Secondary | ICD-10-CM

## 2021-05-25 LAB — BRAIN NATRIURETIC PEPTIDE: B Natriuretic Peptide: 43.4 pg/mL (ref 0.0–100.0)

## 2021-05-25 LAB — BASIC METABOLIC PANEL
Anion gap: 8 (ref 5–15)
BUN: 16 mg/dL (ref 8–23)
CO2: 27 mmol/L (ref 22–32)
Calcium: 9.1 mg/dL (ref 8.9–10.3)
Chloride: 104 mmol/L (ref 98–111)
Creatinine, Ser: 0.58 mg/dL — ABNORMAL LOW (ref 0.61–1.24)
GFR, Estimated: >60 mL/min (ref >60)
Glucose, Bld: 133 mg/dL — ABNORMAL HIGH (ref 70–99)
Potassium: 3.3 mmol/L — ABNORMAL LOW (ref 3.5–5.1)
Sodium: 139 mmol/L (ref 135–145)

## 2021-05-25 LAB — CBC
HCT: 32.9 % — ABNORMAL LOW (ref 39.0–52.0)
Hemoglobin: 10.7 g/dL — ABNORMAL LOW (ref 13.0–17.0)
MCH: 28.2 pg (ref 26.0–34.0)
MCHC: 32.5 g/dL (ref 30.0–36.0)
MCV: 86.8 fL (ref 80.0–100.0)
Platelets: 343 10*3/uL (ref 150–400)
RBC: 3.79 MIL/uL — ABNORMAL LOW (ref 4.22–5.81)
RDW: 15.3 % (ref 11.5–15.5)
WBC: 7.2 10*3/uL (ref 4.0–10.5)
nRBC: 0 % (ref 0.0–0.2)

## 2021-05-25 LAB — GLUCOSE, CAPILLARY
Glucose-Capillary: 134 mg/dL — ABNORMAL HIGH (ref 70–99)
Glucose-Capillary: 143 mg/dL — ABNORMAL HIGH (ref 70–99)
Glucose-Capillary: 204 mg/dL — ABNORMAL HIGH (ref 70–99)
Glucose-Capillary: 172 mg/dL — ABNORMAL HIGH (ref 70–99)

## 2021-05-25 LAB — PHOSPHORUS: Phosphorus: 2.6 mg/dL (ref 2.5–4.6)

## 2021-05-25 MED ORDER — POTASSIUM CHLORIDE CRYS ER 20 MEQ PO TBCR
40.0000 meq | EXTENDED_RELEASE_TABLET | Freq: Once | ORAL | Status: AC
  Administered 2021-05-25: 40 meq via ORAL
  Filled 2021-05-25: qty 2

## 2021-05-25 MED ORDER — K PHOS MONO-SOD PHOS DI & MONO 155-852-130 MG PO TABS
500.0000 mg | ORAL_TABLET | Freq: Every day | ORAL | Status: AC
  Administered 2021-05-25 – 2021-05-29 (×4): 500 mg via ORAL
  Filled 2021-05-25 (×5): qty 2

## 2021-05-25 MED ORDER — ASCORBIC ACID 500 MG PO TABS
250.0000 mg | ORAL_TABLET | Freq: Two times a day (BID) | ORAL | Status: DC
  Administered 2021-05-25 – 2021-06-08 (×27): 250 mg via ORAL
  Filled 2021-05-25 (×27): qty 1

## 2021-05-25 MED ORDER — ADULT MULTIVITAMIN W/MINERALS CH
1.0000 | ORAL_TABLET | Freq: Every day | ORAL | Status: DC
  Administered 2021-05-25 – 2021-06-07 (×13): 1 via ORAL
  Filled 2021-05-25 (×13): qty 1

## 2021-05-25 NOTE — Plan of Care (Signed)

## 2021-05-25 NOTE — Progress Notes (Signed)
   Date of Admission:  05/19/2021    ID: Rodney Kelly is a 87 y.o. male  Principal Problem:   Diabetic foot infection (HCC) Active Problems:   Coronary artery disease   Type 2 diabetes mellitus with complication, without long-term current use of insulin (HCC)   Diabetic foot ulcers (HCC)   Hyperlipemia   GERD (gastroesophageal reflux disease)   Normocytic anemia   Cellulitis of right lower extremity    Subjective: Sitting in chair and eating soft food Alert and says he is okay  Medications:   vitamin C  250 mg Oral BID   aspirin  81 mg Oral Daily   Chlorhexidine Gluconate Cloth  6 each Topical Daily   enoxaparin (LOVENOX) injection  40 mg Subcutaneous Q24H   fenofibrate  160 mg Oral Daily   insulin aspart  0-5 Units Subcutaneous QHS   insulin aspart  0-9 Units Subcutaneous TID WC   metoprolol tartrate  2.5 mg Intravenous Q6H   multivitamin with minerals  1 tablet Oral Daily   mupirocin ointment   Topical Daily   pantoprazole (PROTONIX) IV  40 mg Intravenous Q24H   phosphorus  500 mg Oral Daily   sodium chloride flush  10 mL Intravenous Q12H   tamsulosin  0.4 mg Oral QPC supper    Objective: Vital signs in last 24 hours: Temp:  [97.3 F (36.3 C)-97.9 F (36.6 C)] 97.8 F (36.6 C) (06/21 0738) Pulse Rate:  [67-79] 67 (06/21 0738) Resp:  [15-18] 18 (06/21 0738) BP: (110-136)/(56-68) 136/63 (06/21 0738) SpO2:  [97 %-99 %] 99 % (06/21 0738)  PHYSICAL EXAM:  General: Alert, cooperative, no distress, appears stated age.  Lungs: b/l air entry Heart: s1s2 Abdomen: Soft,  Extremities: rt foot small wound , no swelling or erythema Skin: No rashes or lesions. Or bruising Lymph: Cervical, supraclavicular normal. Neurologic: Grossly non-focal  Lab Results Recent Labs    05/24/21 0422 05/25/21 0535  WBC 7.2 7.2  HGB 10.5* 10.7*  HCT 32.0* 32.9*  NA 139 139  K 3.5 3.3*  CL 104 104  CO2 24 27  BUN 14 16  CREATININE 0.76 0.58*  Microbiology: Wound  culture NG Studies/Results: No results found.   Assessment/Plan:  Chronic rt foot small ulcer with underlying sesamoid bone osteomyelitis Cultures have been negative Currently on cefazolin  Pt seen by Dr.Fowler who will follow him as OP and decide on surgery as pt currently is not stable for surgery  Can change to cefadroxil PO on discharge for 2 weeks- may need more if surgery is not possible  Aspiration pneumonitis  Encephalopathy- much better  Discussed the management with podiatrist

## 2021-05-25 NOTE — Progress Notes (Signed)
Daily Progress Note   Subjective  -   Follow-up bilateral foot ulcerations.  Patient was admitted with cellulitis with ulceration to the plantar aspect of the right foot probe down to bone.  MRI was consistent with osteomyelitis.  Unfortunately patient had severe confusion status post aspiration.  Seems to be doing somewhat better today.  Answered questions appropriately.  Objective Vitals:   05/25/21 0014 05/25/21 0437 05/25/21 0738 05/25/21 1625  BP: 136/68 (!) 123/59 136/63 131/79  Pulse: 72 73 67 77  Resp: 17 16 18 16   Temp: 97.9 F (36.6 C) (!) 97.3 F (36.3 C) 97.8 F (36.6 C) 98 F (36.7 C)  TempSrc:   Oral Oral  SpO2: 98% 97% 99% 99%  Weight:      Height:        Physical Exam: Edema and erythema to the right foot are markedly improved from outpatient.  No purulent drainage.  The wound is much more stable appearing at this point as well.  I suspect patient being virtually bedbound has really alleviated the pressure from the ulcerations.  Both the right foot and left heel are markedly improved.  Wound culture grew staph epidermidis.  Resistant to erythromycin and tetracycline.  Laboratory CBC    Component Value Date/Time   WBC 7.2 05/25/2021 0535   HGB 10.7 (L) 05/25/2021 0535   HCT 32.9 (L) 05/25/2021 0535   PLT 343 05/25/2021 0535    BMET    Component Value Date/Time   NA 139 05/25/2021 0535   K 3.3 (L) 05/25/2021 0535   CL 104 05/25/2021 0535   CO2 27 05/25/2021 0535   GLUCOSE 133 (H) 05/25/2021 0535   BUN 16 05/25/2021 0535   CREATININE 0.58 (L) 05/25/2021 0535   CALCIUM 9.1 05/25/2021 0535   GFRNONAA >60 05/25/2021 0535   GFRAA >60 09/28/2018 0749    Assessment/Planning: Osteomyelitis right first MTPJ Peripheral vascular disease  Currently the foot seems to be improving.  I suspect the extended period of nonweightbearing has been quite a bit of help.  Unfortunately he likely still has active osteomyelitis in the first metatarsal phalangeal joint  region.  Patient is continue to be evaluated by medicine. Appreciate ID input in regards to continue care with antibiotics.  Once patient is medically stable can reevaluate for possible surgical intervention with excision of first metatarsal phalangeal joint and likely first ray amputation.  Discussion with internal medicine was consistent with prolonged evaluation possibly outpatient from a medical standpoint prior to clearance for surgery. Continue with current wound care.  We will continue to follow peripherally for now.  Once patient is deemed medically stable for surgery we will rediscuss surgical intervention with the patient and his son.  09/30/2018 A  05/25/2021, 8:05 PM

## 2021-05-25 NOTE — Plan of Care (Signed)
  Problem: Education: Goal: Knowledge of General Education information will improve Description: Including pain rating scale, medication(s)/side effects and non-pharmacologic comfort measures Outcome: Progressing   Problem: Health Behavior/Discharge Planning: Goal: Ability to manage health-related needs will improve Outcome: Progressing   Problem: Clinical Measurements: Goal: Ability to maintain clinical measurements within normal limits will improve Outcome: Progressing Goal: Will remain free from infection Outcome: Progressing Goal: Diagnostic test results will improve Outcome: Progressing Goal: Respiratory complications will improve Outcome: Progressing Goal: Cardiovascular complication will be avoided Outcome: Progressing   Problem: Nutrition: Goal: Adequate nutrition will be maintained Outcome: Progressing   Problem: Activity: Goal: Risk for activity intolerance will decrease Outcome: Progressing   Problem: Coping: Goal: Level of anxiety will decrease Outcome: Progressing   Problem: Elimination: Goal: Will not experience complications related to bowel motility Outcome: Progressing Goal: Will not experience complications related to urinary retention Outcome: Progressing   Problem: Pain Managment: Goal: General experience of comfort will improve Outcome: Progressing   Problem: Safety: Goal: Ability to remain free from injury will improve Outcome: Progressing   Problem: Skin Integrity: Goal: Risk for impaired skin integrity will decrease Outcome: Progressing   Problem: Education: Goal: Ability to describe self-care measures that may prevent or decrease complications (Diabetes Survival Skills Education) will improve Outcome: Progressing Goal: Individualized Educational Video(s) Outcome: Progressing   Problem: Coping: Goal: Ability to adjust to condition or change in health will improve Outcome: Progressing   Problem: Fluid Volume: Goal: Ability to  maintain a balanced intake and output will improve Outcome: Progressing   Problem: Health Behavior/Discharge Planning: Goal: Ability to identify and utilize available resources and services will improve Outcome: Progressing Goal: Ability to manage health-related needs will improve Outcome: Progressing   Problem: Metabolic: Goal: Ability to maintain appropriate glucose levels will improve Outcome: Progressing   Problem: Nutritional: Goal: Maintenance of adequate nutrition will improve Outcome: Progressing Goal: Progress toward achieving an optimal weight will improve Outcome: Progressing   Problem: Skin Integrity: Goal: Risk for impaired skin integrity will decrease Outcome: Progressing   Problem: Tissue Perfusion: Goal: Adequacy of tissue perfusion will improve Outcome: Progressing   

## 2021-05-25 NOTE — Progress Notes (Signed)
PROGRESS NOTE    Rodney Kelly  NWG:[id number] DOB: 08/27/1924 DOA: 05/19/2021 PCP: Wilkie Aye, MD    Chief Complaint  Patient presents with   Wound Infection    Brief Narrative:   87 y.o. male with medical history significant of DM, HLD, PCD, GERD, CAD, MI, diabetic foot ulcer, dementia who presents with foot ulcer with infection, work-up significant for osteomyelitis, patient was seen by vascular surgery, no further work-up from their side, he has been followed by podiatry.  MRI significant for osteomyelitis, patient with severe hospital delirium, on 6/17 early a.m., patient with hypoxia, new oxygen requirement and chest x-ray showing aspiration in right lung base.  Hospital course complicated by severe delirium, aspiration pneumonia with dysphagia.  Assessment & Plan:   Principal Problem:   Diabetic foot infection (HCC) Active Problems:   Coronary artery disease   Type 2 diabetes mellitus with complication, without long-term current use of insulin (HCC)   Diabetic foot ulcers (HCC)   Hyperlipemia   GERD (gastroesophageal reflux disease)   Normocytic anemia   Cellulitis of right lower extremity   Right foot, cellulitis/myofasciitis with abscess,  osteomyelitis right great toe and first metatarsal bone. - Patient failed outpatient oral antibiotic doxycycline treatment.  -  Patient has tachycardia, but no fever, tachypnea or leukocytosis.  Clinically not septic.  Lactic acid normal 1.2. Dr. Excell Seltzer of podiatry and Dr. Wyn Quaker of [facility] are consulted. - Vascular surgery input greatly appreciated, no further work-up currently, outpatient follow-up. -No evidence of DVT on venous Dopplers -Podiatry input greatly appreciated, plan for right partial first ray amputation with left heel wound debridement, initial plan was for Friday, given his severe delirium, and clinical deterioration, this has been postponed, I have discussed with podiatry, given his severe delirium, I do  think he will be able to tolerate any surgical procedure this admission, and would rather postpone it at a later admission, podiatry agreeable with that . -ID input greatly appreciated, currently on cefazolin/Flagyl, ending clinical progression, likely will be able to be discharged at 1 point on oral regimen.   Acute metabolic encephalopathy/severe hospital delirium -Patient with severe hospital delirium, he is having a bedside sitter, as needed Haldol as well, trying to minimize narcotics and benzo diazepam, increased frequency of reorientation, trying to correct day/night cycle. -Continue with bedside sitter -I have discussed with son, encouraged him to have him or family member stay next to patient to have familiar faces. -N CT head with no acute findings -I have discussed with staff, they were encouraged to take patient outside hospital building with PT and sitter, which they have to manage to yesterday, and will reattempt today as well.  Aspiration pneumonia -Chest x-ray significant for right lung base opacity, on antibiotic coverage, seen closely by SLP, currently advanced to dysphagia 1 with nectar thick   Sinus tachycardia -Discussed EKG finding on 6/19 with Dr. Juliann Pares  who eviewed the EKG, doubt it is A. fib, most likely sinus tachycardia with PACs versus SVT, I could not appreciate any significant A. fib on his telemetry strip, he is on metoprolol 2.5 mg every 6 hours IV (given his n.p.o.), at this point I see no indication for full anticoagulation as cannot confirm A. fib.  Hypokalemia -Replete as needed.   Coronary artery disease:  -No CP. S/p of stent per his son -ASA, tricor, Imdur -prn NGT  Elevated D-dimers -Lower extremity Dopplers negative for DVT, continue with Lovenox 40 mg subcu daily.  Urinary retention -Foley catheter inserted  6/17,  started on Flomax.  Attempt voiding trial today.   Type 2 diabetes mellitus with complication of foot ulcer, without long-term  current use of insulin (HCC): Recent A1c 6.4, well controlled.  Patient taking metformin at home -Sliding scale insulin   Hyperlipemia -Fenofibrate -Patient is not taking Lipitor currently   GERD (gastroesophageal reflux disease) -Protonix   Normocytic anemia:  -Plan, this is most likely anemia of chronic disease    Son requested patient to be transferred to [facility], I have called [facility] to requested transfer on 6/19, have discussed with internist on-call Dr. Seth Bake, given there is no indication for transfer there, and all services available at our facility, they have declined transfer especially they are at capacity, I have updated the son at  DVT prophylaxis: Heparin Code Status: Full Family Communication: Discussed with son at bedside multiple times every day.    Disposition:   Status is: Inpatient  Remains inpatient appropriate because:IV treatments appropriate due to intensity of illness or inability to take PO  Dispo: The patient is from: Home              Anticipated d/c is to: Home              Patient currently is not medically stable to d/c.   Difficult to place patient No       Consultants:  Podiatry Vascular surgery ID  Subjective:  No significant events overnight, patient was able to go with staff/son side hospital building, to minimize his hospital delirium, he is on dysphagia 1 with nectar thick which she has been tolerating so far.     Objective: Vitals:   05/24/21 2039 05/25/21 0014 05/25/21 0437 05/25/21 0738  BP: (!) 110/56 136/68 (!) 123/59 136/63  Pulse: 77 72 73 67  Resp: 15 17 16 18   Temp: 97.8 F (36.6 C) 97.9 F (36.6 C) (!) 97.3 F (36.3 C) 97.8 F (36.6 C)  TempSrc:    Oral  SpO2: 97% 98% 97% 99%  Weight:      Height:        Intake/Output Summary (Last 24 hours) at 05/25/2021 1208 Last data filed at 05/25/2021 0534 Gross per 24 hour  Intake 2364.83 ml  Output 450 ml  Net 1914.83 ml    Filed Weights   05/19/21 0722   Weight: 59 kg    Examination:  Frail elderly patient, sitting in recliner, with fluctuating mental status, confused, frail  Symmetrical Chest wall movement, fair air entry at the bases, no wheezing RRR,No Gallops,Rubs or new Murmurs, No Parasternal Heave +ve B.Sounds, Abd Soft, No tenderness, No rebound - guarding or rigidity. No Cyanosis, bilateral feet with Ace wrap.   Data Reviewed: I have personally reviewed following labs and imaging studies  CBC: Recent Labs  Lab 05/19/21 0728 05/20/21 0416 05/21/21 0930 05/22/21 0456 05/23/21 0514 05/24/21 0422 05/25/21 0535  WBC 9.1   < > 10.2 8.1 9.1 7.2 7.2  NEUTROABS 6.8  --   --   --   --   --   --   HGB 10.5*   < > 10.4* 9.5* 10.8* 10.5* 10.7*  HCT 32.7*   < > 32.1* 29.6* 32.9* 32.0* 32.9*  MCV 88.9   < > 87.7 87.1 85.9 86.3 86.8  PLT 323   < > 337 333 371 376 343   < > = values in this interval not displayed.    Basic Metabolic Panel: Recent Labs  Lab 05/21/21 0930 05/22/21 0456  05/22/21 2336 05/23/21 0514 05/24/21 0422 05/25/21 0535  NA 136 137  --  139 139 139  K 3.6 3.4*  --  3.5 3.5 3.3*  CL 103 107  --  105 104 104  CO2 27 24  --  23 24 27   GLUCOSE 144* 126*  --  123* 108* 133*  BUN 15 12  --  13 14 16   CREATININE 0.55* 0.59*  --  0.71 0.76 0.58*  CALCIUM 9.2 9.1  --  9.2 8.8* 9.1  MG  --   --  1.7  --   --   --   PHOS  --   --   --  2.5  --  2.6    GFR: Estimated Creatinine Clearance: 47.1 mL/min (A) (by C-G formula based on SCr of 0.58 mg/dL (L)).  Liver Function Tests: Recent Labs  Lab 05/19/21 0728  AST 12*  ALT 12  ALKPHOS 74  BILITOT 0.6  PROT 7.3  ALBUMIN 3.1*    CBG: Recent Labs  Lab 05/24/21 1141 05/24/21 1732 05/24/21 2102 05/25/21 0732 05/25/21 1133  GLUCAP 99 134* 158* 134* 143*     Recent Results (from the past 240 hour(s))  Blood Cultures x 2 sites     Status: None   Collection Time: 05/19/21  9:35 AM   Specimen: BLOOD  Result Value Ref Range Status   Specimen  Description BLOOD LEFT AC  Final   Special Requests   Final    BOTTLES DRAWN AEROBIC AND ANAEROBIC Blood Culture results may not be optimal due to an excessive volume of blood received in culture bottles   Culture   Final    NO GROWTH 5 DAYS Performed at [facility], [address] [place], [place] [address]    Report Status 05/24/2021 FINAL  Final  Blood Cultures x 2 sites     Status: None   Collection Time: 05/19/21  9:35 AM   Specimen: BLOOD  Result Value Ref Range Status   Specimen Description BLOOD  [facility]  Final   Special Requests   Final    BOTTLES DRAWN AEROBIC AND ANAEROBIC Blood Culture adequate volume   Culture   Final    NO GROWTH 5 DAYS Performed at [facility], [address] [place], [place] [address]    Report Status 05/24/2021 FINAL  Final  SARS CORONAVIRUS 2 (TAT 6-24 HRS) Nasopharyngeal Nasopharyngeal Swab     Status: None   Collection Time: 05/19/21  9:35 AM   Specimen: Nasopharyngeal Swab  Result Value Ref Range Status   SARS Coronavirus 2 NEGATIVE NEGATIVE Final    Comment: (NOTE) SARS-CoV-2 target nucleic acids are NOT DETECTED.  The SARS-CoV-2 RNA is generally detectable in upper and lower respiratory specimens during the acute phase of infection. Negative results do not preclude SARS-CoV-2 infection, do not rule out co-infections with other pathogens, and should not be used as the sole basis for treatment or other patient management decisions. Negative results must be combined with clinical observations, patient history, and epidemiological information. The expected result is Negative.  Fact Sheet for Patients: HairSlick.nohttps://www.fda.gov/media/138098/download  Fact Sheet for Healthcare Providers: quierodirigir.comhttps://www.fda.gov/media/138095/download  This test is not yet approved or cleared by the [place] FDA and  has been authorized for detection and/or diagnosis of SARS-CoV-2 by FDA under an Emergency Use Authorization (EUA). This  EUA will remain  in effect (meaning this test can be used) for the duration of the COVID-19 declaration under Se ction 564(b)(1)  of the Act, 21 U.S.C. section 360bbb-3(b)(1), unless the authorization is terminated or revoked sooner.  Performed at [facility], [address], [place], [place] [address]   Aerobic/Anaerobic Culture w Gram Stain (surgical/deep wound)     Status: None   Collection Time: 05/19/21 12:36 PM   Specimen: Wound  Result Value Ref Range Status   Specimen Description   Final    WOUND Performed at [facility], [address], [place], [place] [address]    Special Requests   Final    NONE Performed at [facility], [address] [place], [place] [address]    Gram Stain   Final    RARE WBC PRESENT, PREDOMINANTLY MONONUCLEAR RARE GRAM POSITIVE COCCI    Culture   Final    RARE STAPHYLOCOCCUS EPIDERMIDIS NO ANAEROBES ISOLATED Performed at [facility], [address] [place], [place] [address]    Report Status 05/24/2021 FINAL  Final   Organism ID, Bacteria STAPHYLOCOCCUS EPIDERMIDIS  Final      Susceptibility   Staphylococcus epidermidis - MIC*    CIPROFLOXACIN <=0.5 SENSITIVE Sensitive     ERYTHROMYCIN >=8 RESISTANT Resistant     GENTAMICIN <=0.5 SENSITIVE Sensitive     OXACILLIN <=0.25 SENSITIVE Sensitive     TETRACYCLINE >=16 RESISTANT Resistant     VANCOMYCIN 1 SENSITIVE Sensitive     TRIMETH/SULFA <=10 SENSITIVE Sensitive     CLINDAMYCIN <=0.25 SENSITIVE Sensitive     RIFAMPIN <=0.5 SENSITIVE Sensitive     Inducible Clindamycin NEGATIVE Sensitive     * RARE STAPHYLOCOCCUS EPIDERMIDIS  MRSA PCR Screening     Status: None   Collection Time: 05/20/21  3:50 PM  Result Value Ref Range Status   MRSA by PCR NEGATIVE NEGATIVE Final    Comment:        The GeneXpert MRSA Assay (FDA approved for NASAL specimens only), is one component of a comprehensive MRSA colonization surveillance program. It is not intended to  diagnose MRSA infection nor to guide or monitor treatment for MRSA infections. Performed at [facility], [address], [place], [place] [address]          Radiology Studies: US Venous Img Lower Unilateral Left (DVT)  Result Date: 05/24/2021 CLINICAL DATA:  Elevated D-dimer.  Left lower extremity edema. EXAM: LEFT LOWER EXTREMITY VENOUS DOPPLER ULTRASOUND TECHNIQUE: Gray-scale sonography with graded compression, as well as color Doppler and duplex ultrasound were performed to evaluate the lower extremity deep venous systems from the level of the common femoral vein and including the common femoral, femoral, profunda femoral, popliteal and calf veins including the posterior tibial, peroneal and gastrocnemius veins when visible. The superficial great saphenous vein was also interrogated. Spectral Doppler was utilized to evaluate flow at rest and with distal augmentation maneuvers in the common femoral, femoral and popliteal veins. COMPARISON:  None. FINDINGS: Contralateral Common Femoral Vein: Respiratory phasicity is normal and symmetric with the symptomatic side. No evidence of thrombus. Normal compressibility. Common Femoral Vein: No evidence of thrombus. Normal compressibility, respiratory phasicity and response to augmentation. Saphenofemoral Junction: No evidence of thrombus. Normal compressibility and flow on color Doppler imaging. Profunda Femoral Vein: No evidence of thrombus. Normal compressibility and flow on color Doppler imaging. Femoral Vein: No evidence of thrombus. Normal compressibility, respiratory phasicity and response to augmentation. Popliteal Vein: No evidence of thrombus. Normal compressibility, respiratory phasicity and response to augmentation. Calf Veins: No evidence of thrombus. Normal compressibility and flow on color Doppler imaging.  Other Findings:  None. IMPRESSION: Negative for deep venous thrombosis in left lower extremity. Electronically Signed   By: Richarda Overlie M.D.   On: 05/24/2021 07:56        Scheduled Meds:  vitamin C  250 mg Oral BID   aspirin  81 mg Oral Daily   Chlorhexidine Gluconate Cloth  6 each Topical Daily   enoxaparin (LOVENOX) injection  40 mg Subcutaneous Q24H   fenofibrate  160 mg Oral Daily   insulin aspart  0-5 Units Subcutaneous QHS   insulin aspart  0-9 Units Subcutaneous TID WC   metoprolol tartrate  2.5 mg Intravenous Q6H   multivitamin with minerals  1 tablet Oral Daily   mupirocin ointment   Topical Daily   pantoprazole (PROTONIX) IV  40 mg Intravenous Q24H   potassium chloride  40 mEq Oral Once   sodium chloride flush  10 mL Intravenous Q12H   tamsulosin  0.4 mg Oral QPC supper   Continuous Infusions:   ceFAZolin (ANCEF) IV 2 g (05/25/21 1610)   lactated ringers 100 mL/hr at 05/25/21 0513   metronidazole 500 mg (05/25/21 0949)     LOS: 6 days       Huey Bienenstock, MD [facility]   To contact the attending provider between 7A-7P or the covering provider during after hours 7P-7A, please log into the web site [email or web address] and access using universal [place] password for that web site. If you do not have the password, please call the hospital operator.  05/25/2021, 12:08 PM

## 2021-05-25 NOTE — Evaluation (Signed)
Occupational Therapy Evaluation Patient Details Name: Rodney Kelly MRN: [id number] DOB: 19-Mar-1924 Today's Date: 05/25/2021    History of Present Illness Per MD notes: Pt is a 87 y.o. male with medical history significant of DM, HLD, PCD, GERD, CAD, MI, diabetic foot ulcer, dementia who presents with foot ulcer with infection, work-up significant for osteomyelitis, patient was seen by vascular surgery, no further work-up from their side, he has been followed by podiatry.  MRI significant for osteomyelitis, patient with severe hospital delirium, on 6/17 early a.m., patient with hypoxia, new oxygen requirement and chest x-ray showing aspiration in right lung base.  Hospital course complicated by severe delirium causing severe dysphagia, where he is strictly n.p.o. even for medications.  MD assessment summary includes: Right foot cellulitis/myofasciitis with abscess, osteomyelitis right great toe and first metatarsal bone, acute toxic encephalopathy/hospital delirium, aspiration pneumonia, sinus tachycardia, hypokalemia, elevated D-dimers with (-) LE dopplers, and urinary retention.   Clinical Impression   Rodney Kelly was seen for OT evaluation this date. Prior to hospital admission, pt required cues for ADLs. Pt lives with son and DIL. Pt presents to acute OT demonstrating impaired ADL performance and functional mobility 2/2 decreased activity tolerance, functional strength/ROM/balance deficits. Pt currently requires MOD A self-feeding - pt requires assist to scoop appropriate sizes and cues for safety swallowing. MAX A for LBD seated in chair.   Pt completed seated therex as described below. Pt would benefit from skilled OT to address noted impairments and functional limitations (see below for any additional details) in order to maximize safety and independence while minimizing falls risk and caregiver burden. Upon hospital discharge, recommend STR to maximize pt safety and return to PLOF.      Follow Up Recommendations  SNF    Equipment Recommendations  3 in 1 bedside commode    Recommendations for Other Services       Precautions / Restrictions Precautions Precautions: Fall Restrictions Weight Bearing Restrictions: Yes RLE Weight Bearing: Weight bearing as tolerated LLE Weight Bearing: Weight bearing as tolerated Other Position/Activity Restrictions: BLE WBAT with post-op shoes donned per Dr. Excell Seltzer      Mobility Bed Mobility               General bed mobility comments: pt received and left in chair    Transfers                 General transfer comment: pt deferred    Balance Overall balance assessment: Needs assistance Sitting-balance support: No upper extremity supported;Feet supported Sitting balance-Leahy Scale: Fair                                     ADL either performed or assessed with clinical judgement   ADL Overall ADL's : Needs assistance/impaired                                       General ADL Comments: MOD A self-feeding - pt requires assist to scoop appropriate sizes and cues for safety swallowing. MAX A for LBD seated in chair      Pertinent Vitals/Pain Pain Assessment: No/denies pain     Hand Dominance Right   Extremity/Trunk Assessment Upper Extremity Assessment Upper Extremity Assessment: Generalized weakness   Lower Extremity Assessment Lower Extremity Assessment: Generalized weakness       Communication  Communication Communication: HOH   Cognition Arousal/Alertness: Awake/alert Behavior During Therapy: Flat affect Overall Cognitive Status: History of cognitive impairments - at baseline                                 General Comments: Pt A&O x 0 but able to follow simple 1-step commands with extra time and cuing. Pt does not recognize son, recognizes wife on FaceTime   General Comments       Exercises Exercises: Other exercises;General Upper  Extremity General Exercises - Upper Extremity Shoulder Flexion: AROM;Strengthening;Both;10 reps;Seated Shoulder Extension: AROM;Strengthening;Both;10 reps;Seated Elbow Flexion: AROM;Strengthening;Both;10 reps;Seated Elbow Extension: AROM;Strengthening;Both;10 reps;Seated Other Exercises Other Exercises: Pt and family educated re: OT role, DME recs, d/c recs, falls prevention, ECS Other Exercises: self-feeding, LBD   Shoulder Instructions      Home Living Family/patient expects to be discharged to:: Private residence Living Arrangements: Children Available Help at Discharge: Family;Available 24 hours/day Type of Home: House Home Access: Stairs to enter [employer] of Steps: 4 Entrance Stairs-Rails: Right;Left;Can reach both Home Layout: One level     Bathroom Shower/Tub: Chief Strategy Officer: Handicapped height     Home Equipment: Grab bars - tub/shower;Grab bars - toilet;Walker - 2 wheels;Cane - quad   Additional Comments: Pt lives with his son and son's wife. History obtained from pt's son via phone call secondary to pt's cognitive deficits      Prior Functioning/Environment Level of Independence: Needs assistance  Gait / Transfers Assistance Needed: Mod Ind amb with a QC limited community distances, no fall history ADL's / Homemaking Assistance Needed: Family assists occasionally with ADLs although pt can do most of his dressing and bathing            OT Problem List: Decreased strength;Decreased range of motion;Decreased activity tolerance;Impaired balance (sitting and/or standing);Decreased cognition;Decreased safety awareness      OT Treatment/Interventions: Self-care/ADL training;Therapeutic exercise;Energy conservation;DME and/or AE instruction;Therapeutic activities;Patient/family education;Balance training    OT Goals(Current goals can be found in the care plan section) Acute Rehab OT Goals Patient Stated Goal: to go home OT Goal  Formulation: With patient/family Time For Goal Achievement: 06/08/21 Potential to Achieve Goals: Fair ADL Goals Pt Will Perform Eating: with set-up;with supervision;sitting (c MIN cues for safety) Pt Will Perform Grooming: with min assist;sitting Pt Will Perform Upper Body Dressing: with supervision;sitting Pt Will Transfer to Toilet: with min assist;stand pivot transfer;bedside commode (c LRAD PRN)  OT Frequency: Min 1X/week    AM-PAC OT "6 Clicks" Daily Activity     Outcome Measure Help from another person eating meals?: A Little Help from another person taking care of personal grooming?: A Little Help from another person toileting, which includes using toliet, bedpan, or urinal?: A Lot Help from another person bathing (including washing, rinsing, drying)?: A Lot Help from another person to put on and taking off regular upper body clothing?: A Lot Help from another person to put on and taking off regular lower body clothing?: A Lot 6 Click Score: 14   End of Session Nurse Communication: Mobility status  Activity Tolerance: Patient tolerated treatment well Patient left: in chair;with call bell/phone within reach;with family/visitor present;with nursing/sitter in room  OT Visit Diagnosis: Unsteadiness on feet (R26.81);Muscle weakness (generalized) (M62.81)                Time: 1457-1530 OT Time Calculation (min): 33 min Charges:  OT General Charges $OT Visit: 1  Visit OT Evaluation $OT Eval Low Complexity: 1 Low OT Treatments $Self Care/Home Management : 8-22 mins $Therapeutic Exercise: 8-22 mins  Kathie Dike, M.S. OTR/L  05/25/21, 4:06 PM  ascom [phone]

## 2021-05-25 NOTE — Progress Notes (Signed)
Speech Language Pathology Treatment: Dysphagia  Patient Details Name: Rodney Kelly MRN: [id number] DOB: 03-21-1924 Today's Date: 05/25/2021 Time: 6384-6659 SLP Time Calculation (min) (ACUTE ONLY): 50 min  Assessment / Plan / Recommendation Clinical Impression  Rodney Kelly seen for ongoing assessment of toleration for oral intake; safety w/ po's. Rodney Kelly continues to present w/ upper airway congestion; wet/phlegmy vocal quality at rest PRIOR to po's -- the Baseline phlegm appeared Less today w/ only intermittent wet/phlegmy cough noted x3-4. Rodney Kelly continues to present w/ oropharyngeal phase dysphagia in light of Significantly declined Cognitive status; advanced Dementia at his Baseline per chart. Rodney Kelly is also exhibiting Mod increased upper airway congestion w/ mucousy, wet vocal quality at Rest PRIOR TO any po's -- Rodney Kelly does not respond w/ volitional attempts to cough/clear this at times. He exhibits Decreased awareness and attention to the clearing the phlegm; suspect the advanced Cognitive decline impacts his overall awareness/engagement and safety during po tasks which increases risk for aspiration, choking. Rodney Kelly's risk for aspiration, as well as meeting nutritional needs fully, is present at this time. He required Mod+ tactile/verbal/ visual cues for orientation to bolus presentation, and during feeding support of the trials.   Rodney Kelly consumed trials of purees, and TSP sips of Honey liquids via TSP. No immediate overt coughing occurred w/ TSP trials this session -- a delayed, mild congested cough noted b/t trials of purees and Honey liquids x2. Suspect impact of the Baseline phlegm in the larynx/pharynx, as well as delayed pharyngeal swallow initiation and Cognitive decline. Rodney Kelly appeared to have reduced laryngeal excursion and multiple swallows during trials. Oral phase was c/b min prolonged bolus management w/ each trial consistency. Oral clearing given Time. Phlegmy quality to phonations/verbalizations  intermittently before and during session.      D/t Rodney Kelly's declined Cognitive status, and his risk for aspiration, recommend initiation of the dysphagia level 1(puree) w/ Honey liquids by TSP; strict aspiration precautions and feeding support by NSG at this time -- stopping if increased s/s of aspiration noted; reduce Distractions during meals and only give po's when Rodney Kelly is alert/engaged. Pills Crushed in Puree for safer swallowing. Support w/ feeding at meals -- check for oral clearing during/post intake. NSG updated. ST services recommends follow w/ Palliative Care for GOC and to discuss the impact of Dementia and swallowing w/ family, Rodney Kelly. Largely suspect that Rodney Kelly's Advanced Dementia could hamper oral intake, meeting hydration needs, and upgrade of diet. Precautions posted in room. The above was discussed and agreed upon w/ NSG, MD.     Son and Sitter/NSG educated on use of swabs for continued oral care and oral stimulation. Education had w/ Son on dysphagia; aspiration; aspiration precautions; impact of Dementia on swallowing.        HPI HPI: Rodney Kelly is a Rodney Kelly with medical history significant of DM, HLD, PCD, GERD, CAD, MI, diabetic foot ulcer, dementia who presents with foot ulcer with infection.  Patient has chronic bilateral foot ulcers.  Patient has been followed up with Dr. Ether Griffins of podiatry. Per chart notes, Rodney Kelly scheduled for first ray amputation and likely debridement of the ulcer on the left heel, per podiatry.  CXR: Coarse reticular changes are present in the right lung base with  additional airways thickening. Could reflect sequela of aspiration  in the appropriate clinical setting.  The more diffusely increased interstitial markings elsewhere are  nonspecific could reflect findings of an underlying interstitial  disease.      SLP Plan  Continue with current plan of care  Recommendations  Diet recommendations: Dysphagia 1 (puree);Honey-thick liquid Liquids provided via:  Teaspoon;No straw Medication Administration: Crushed with puree Supervision: Staff to assist with self feeding;Full supervision/cueing for compensatory strategies Compensations: Minimize environmental distractions;Slow rate;Small sips/bites;Lingual sweep for clearance of pocketing;Multiple dry swallows after each bite/sip;Follow solids with liquid Postural Changes and/or Swallow Maneuvers: Out of bed for meals;Seated upright 90 degrees;Upright 30-60 min after meal                General recommendations:  (Palliative Care consult; Dietician f/u) Oral Care Recommendations: Oral care BID;Oral care before and after PO;Staff/trained caregiver to provide oral care Follow up Recommendations: Skilled Nursing facility;Home health SLP (TBD by family) SLP Visit Diagnosis: Dysphagia, oropharyngeal phase (R13.12) (significant Cognitive decline; Dementia) Plan: Continue with current plan of care       GO                  Jerilynn Som, MS, CCC-SLP Speech Language Pathologist Rehab Services [phone] [facility] 05/25/2021, 11:39 AM

## 2021-05-26 ENCOUNTER — Other Ambulatory Visit (HOSPITAL_COMMUNITY): Payer: Self-pay

## 2021-05-26 DIAGNOSIS — L089 Local infection of the skin and subcutaneous tissue, unspecified: Secondary | ICD-10-CM | POA: Diagnosis not present

## 2021-05-26 DIAGNOSIS — E11628 Type 2 diabetes mellitus with other skin complications: Principal | ICD-10-CM

## 2021-05-26 DIAGNOSIS — L270 Generalized skin eruption due to drugs and medicaments taken internally: Secondary | ICD-10-CM

## 2021-05-26 LAB — CBC WITH DIFFERENTIAL/PLATELET
Abs Immature Granulocytes: 0.03 10*3/uL (ref 0.00–0.07)
Basophils Absolute: 0 10*3/uL (ref 0.0–0.1)
Basophils Relative: 1 %
Eosinophils Absolute: 0.4 10*3/uL (ref 0.0–0.5)
Eosinophils Relative: 5 %
HCT: 31.8 % — ABNORMAL LOW (ref 39.0–52.0)
Hemoglobin: 10.1 g/dL — ABNORMAL LOW (ref 13.0–17.0)
Immature Granulocytes: 0 %
Lymphocytes Relative: 13 %
Lymphs Abs: 0.9 10*3/uL (ref 0.7–4.0)
MCH: 28.3 pg (ref 26.0–34.0)
MCHC: 31.8 g/dL (ref 30.0–36.0)
MCV: 89.1 fL (ref 80.0–100.0)
Monocytes Absolute: 0.7 10*3/uL (ref 0.1–1.0)
Monocytes Relative: 10 %
Neutro Abs: 5.1 10*3/uL (ref 1.7–7.7)
Neutrophils Relative %: 71 %
Platelets: 349 10*3/uL (ref 150–400)
RBC: 3.57 MIL/uL — ABNORMAL LOW (ref 4.22–5.81)
RDW: 15.5 % (ref 11.5–15.5)
WBC: 7.2 10*3/uL (ref 4.0–10.5)
nRBC: 0 % (ref 0.0–0.2)

## 2021-05-26 LAB — CBC
HCT: 31 % — ABNORMAL LOW (ref 39.0–52.0)
Hemoglobin: 9.9 g/dL — ABNORMAL LOW (ref 13.0–17.0)
MCH: 27.8 pg (ref 26.0–34.0)
MCHC: 31.9 g/dL (ref 30.0–36.0)
MCV: 87.1 fL (ref 80.0–100.0)
Platelets: 346 10*3/uL (ref 150–400)
RBC: 3.56 MIL/uL — ABNORMAL LOW (ref 4.22–5.81)
RDW: 15.3 % (ref 11.5–15.5)
WBC: 6.1 10*3/uL (ref 4.0–10.5)
nRBC: 0 % (ref 0.0–0.2)

## 2021-05-26 LAB — BASIC METABOLIC PANEL
Anion gap: 5 (ref 5–15)
BUN: 13 mg/dL (ref 8–23)
CO2: 30 mmol/L (ref 22–32)
Calcium: 8.8 mg/dL — ABNORMAL LOW (ref 8.9–10.3)
Chloride: 104 mmol/L (ref 98–111)
Creatinine, Ser: 0.62 mg/dL (ref 0.61–1.24)
GFR, Estimated: >60 mL/min (ref >60)
Glucose, Bld: 130 mg/dL — ABNORMAL HIGH (ref 70–99)
Potassium: 3.3 mmol/L — ABNORMAL LOW (ref 3.5–5.1)
Sodium: 139 mmol/L (ref 135–145)

## 2021-05-26 LAB — GLUCOSE, CAPILLARY
Glucose-Capillary: 125 mg/dL — ABNORMAL HIGH (ref 70–99)
Glucose-Capillary: 144 mg/dL — ABNORMAL HIGH (ref 70–99)
Glucose-Capillary: 170 mg/dL — ABNORMAL HIGH (ref 70–99)
Glucose-Capillary: 159 mg/dL — ABNORMAL HIGH (ref 70–99)

## 2021-05-26 LAB — BRAIN NATRIURETIC PEPTIDE: B Natriuretic Peptide: 53.6 pg/mL (ref 0.0–100.0)

## 2021-05-26 LAB — MAGNESIUM: Magnesium: 1.8 mg/dL (ref 1.7–2.4)

## 2021-05-26 LAB — PHOSPHORUS: Phosphorus: 2.5 mg/dL (ref 2.5–4.6)

## 2021-05-26 MED ORDER — MAGNESIUM SULFATE IN D5W 1-5 GM/100ML-% IV SOLN
1.0000 g | Freq: Once | INTRAVENOUS | Status: AC
  Administered 2021-05-26: 1 g via INTRAVENOUS
  Filled 2021-05-26: qty 100

## 2021-05-26 MED ORDER — PANTOPRAZOLE SODIUM 40 MG PO TBEC
40.0000 mg | DELAYED_RELEASE_TABLET | Freq: Every day | ORAL | Status: DC
  Administered 2021-05-27 – 2021-06-08 (×12): 40 mg via ORAL
  Filled 2021-05-26 (×12): qty 1

## 2021-05-26 MED ORDER — POTASSIUM CHLORIDE CRYS ER 20 MEQ PO TBCR
40.0000 meq | EXTENDED_RELEASE_TABLET | Freq: Once | ORAL | Status: AC
  Administered 2021-05-26: 40 meq via ORAL
  Filled 2021-05-26: qty 2

## 2021-05-26 MED ORDER — DIPHENHYDRAMINE HCL 50 MG/ML IJ SOLN
12.5000 mg | Freq: Four times a day (QID) | INTRAMUSCULAR | Status: DC | PRN
  Administered 2021-05-26 – 2021-05-27 (×2): 12.5 mg via INTRAVENOUS
  Filled 2021-05-26 (×3): qty 1

## 2021-05-26 NOTE — Progress Notes (Signed)
A new rash was noted covering pt's back yesterday and worsened overnight, spreading to pt's groin area. Pt reported itching at that time and improved with prn benadryl. Holding antibiotics until MD evaluation.

## 2021-05-26 NOTE — Plan of Care (Signed)

## 2021-05-26 NOTE — Progress Notes (Addendum)
PROGRESS NOTE    Rodney Kelly  WUJ:[id number] DOB: [birth year] DOA: 05/19/2021 PCP: Wilkie Aye, MD    Chief Complaint  Patient presents with   Wound Infection    Brief Narrative:   87 y.o. male with medical history significant of DM, HLD, PCD, GERD, CAD, MI, diabetic foot ulcer, dementia who presents with foot ulcer with infection, work-up significant for osteomyelitis, patient was seen by vascular surgery, no further work-up from their side, he has been followed by podiatry.  MRI significant for osteomyelitis, patient with severe hospital delirium, on 6/17 early a.m., patient with hypoxia, new oxygen requirement and chest x-ray showing aspiration in right lung base.  Hospital course complicated by severe delirium, aspiration pneumonia with dysphagia.  Assessment & Plan:   Principal Problem:   Diabetic foot infection (HCC) Active Problems:   Coronary artery disease   Type 2 diabetes mellitus with complication, without long-term current use of insulin (HCC)   Diabetic foot ulcers (HCC)   Hyperlipemia   GERD (gastroesophageal reflux disease)   Normocytic anemia   Cellulitis of right lower extremity   Right foot, cellulitis/myofasciitis with abscess,  osteomyelitis right great toe and first metatarsal bone. - Patient failed outpatient oral antibiotic doxycycline treatment.  -  Patient has tachycardia, but no fever, tachypnea or leukocytosis.  Clinically not septic.  Lactic acid normal 1.2. Dr. Excell Seltzer of podiatry and Dr. Wyn Quaker of [facility] are consulted. -No evidence of DVT on venous Dopplers -discussed w/ podiatry today. Amputation initially on hold given acute delirium and aspiration pneumonia. Pt's aspiration pneumonia/itis is resolved and is close to baseline, plan now to proceed w/ amputation likely 6/24 - abx on hold given rash w/ cefazolin  Rash Macular rash on back present since yesterday. Question drug reaction, ID agrees - cefazolin on hold per ID - if rash  clears w/ holding cefazolin will add to allergies  Acute metabolic encephalopathy/severe hospital delirium -Patient with severe hospital delirium, he is having a bedside sitter, as needed Haldol as well, trying to minimize narcotics and benzo diazepam, increased frequency of reorientation, trying to correct day/night cycle. -Continue with bedside sitter -I have discussed with son, encouraged him to have him or family member stay next to patient to have familiar faces. -N CT head with no acute findings - appears to be improving  Aspiration pneumonia -Chest x-ray significant for right lung base opacity, on antibiotic coverage, seen closely by SLP, currently advanced to dysphagia 1 with nectar thick. S/p 5 days flagyl, now on abx as above  Sinus tachycardia -Discussed EKG finding on 6/19 with Dr. Juliann Pares  who eviewed the EKG, doubt it is A. fib, most likely sinus tachycardia with PACs versus SVT, - hold iv metoprolol for nlw  Hypokalemia 3.3 today - replete w/ 40, f/u mg   Coronary artery disease:  -No CP. S/p of stent per his son -ASA, tricor, Imdur -prn NGT  Elevated D-dimers -Lower extremity Dopplers negative for DVT, continue with Lovenox 40 mg subcu daily.  Urinary retention -Foley catheter inserted 6/17, now removed,  started on Flomax.     Type 2 diabetes mellitus with complication of foot ulcer, without long-term current use of insulin (HCC): Recent A1c 6.4, well controlled.  Patient taking metformin at home -Sliding scale insulin   Hyperlipemia -Fenofibrate -Patient is not taking Lipitor currently   GERD (gastroesophageal reflux disease) -Protonix   Normocytic anemia:  -Plan, this is most likely anemia of chronic disease    Son requested patient to be transferred  to [facility], previous provider have called [facility] to requested transfer on 6/19, have discussed with internist on-call Dr. Seth Bake, given there is no indication for transfer there, and all services available  at our facility, they have declined transfer especially they are at capacity, I have updated the son at  DVT prophylaxis: lovenox Code Status: Full Family Communication: son updated @ bedside 6/22 Disposition:   Status is: Inpatient  Remains inpatient appropriate because:IV treatments appropriate due to intensity of illness or inability to take PO  Dispo: The patient is from: Home              Anticipated d/c is to: Home              Patient currently is not medically stable to d/c.   Difficult to place patient No       Consultants:  Podiatry Vascular surgery ID  Subjective:  No significant events overnight, rash on back. Breathing comfortably   Objective: Vitals:   05/25/21 1625 05/25/21 2013 05/26/21 0401 05/26/21 0743  BP: 131/79 (!) 121/57 124/67 (!) 141/79  Pulse: 77 79 72 86  Resp: 16 16 17 15   Temp: 98 F (36.7 C) 97.7 F (36.5 C) 99.1 F (37.3 C) (!) 97.4 F (36.3 C)  TempSrc: Oral  Oral Oral  SpO2: 99% 99% 96% 96%  Weight:      Height:        Intake/Output Summary (Last 24 hours) at 05/26/2021 1026 Last data filed at 05/26/2021 05/28/2021 Gross per 24 hour  Intake --  Output 850 ml  Net -850 ml    Filed Weights   05/19/21 0722  Weight: 59 kg    Examination:  Frail elderly patient, sitting in recliner, with fluctuating mental status, confused, frail  Symmetrical Chest wall movement, rales at bases RRR,No Gallops,Rubs or new Murmurs,  +ve B.Sounds, Abd Soft, No tenderness, No rebound - guarding or rigidity. No Cyanosis, bilateral feet with Ace wrap.   Data Reviewed: I have personally reviewed following labs and imaging studies  CBC: Recent Labs  Lab 05/22/21 0456 05/23/21 0514 05/24/21 0422 05/25/21 0535 05/26/21 0423  WBC 8.1 9.1 7.2 7.2 6.1  HGB 9.5* 10.8* 10.5* 10.7* 9.9*  HCT 29.6* 32.9* 32.0* 32.9* 31.0*  MCV 87.1 85.9 86.3 86.8 87.1  PLT 333 371 376 343 346    Basic Metabolic Panel: Recent Labs  Lab 05/22/21 0456  05/22/21 2336 05/23/21 0514 05/24/21 0422 05/25/21 0535 05/26/21 0423  NA 137  --  139 139 139 139  K 3.4*  --  3.5 3.5 3.3* 3.3*  CL 107  --  105 104 104 104  CO2 24  --  23 24 27 30   GLUCOSE 126*  --  123* 108* 133* 130*  BUN 12  --  13 14 16 13   CREATININE 0.59*  --  0.71 0.76 0.58* 0.62  CALCIUM 9.1  --  9.2 8.8* 9.1 8.8*  MG  --  1.7  --   --   --   --   PHOS  --   --  2.5  --  2.6 2.5    GFR: Estimated Creatinine Clearance: 47.1 mL/min (by C-G formula based on SCr of 0.62 mg/dL).  Liver Function Tests: No results for input(s): AST, ALT, ALKPHOS, BILITOT, PROT, ALBUMIN in the last 168 hours.   CBG: Recent Labs  Lab 05/25/21 0732 05/25/21 1133 05/25/21 1647 05/25/21 2048 05/26/21 0742  GLUCAP 134* 143* 204* 172* 125*  Recent Results (from the past 240 hour(s))  Blood Cultures x 2 sites     Status: None   Collection Time: 05/19/21  9:35 AM   Specimen: BLOOD  Result Value Ref Range Status   Specimen Description BLOOD LEFT AC  Final   Special Requests   Final    BOTTLES DRAWN AEROBIC AND ANAEROBIC Blood Culture results may not be optimal due to an excessive volume of blood received in culture bottles   Culture   Final    NO GROWTH 5 DAYS Performed at [facility], [address], [place], [place] [address]    Report Status 05/24/2021 FINAL  Final  Blood Cultures x 2 sites     Status: None   Collection Time: 05/19/21  9:35 AM   Specimen: BLOOD  Result Value Ref Range Status   Specimen Description BLOOD  RIGHT [facility]  Final   Special Requests   Final    BOTTLES DRAWN AEROBIC AND ANAEROBIC Blood Culture adequate volume   Culture   Final    NO GROWTH 5 DAYS Performed at [facility], [address], [place], [place] [address]    Report Status 05/24/2021 FINAL  Final  SARS CORONAVIRUS 2 (TAT 6-24 HRS) Nasopharyngeal Nasopharyngeal Swab     Status: None   Collection Time: 05/19/21  9:35 AM   Specimen: Nasopharyngeal Swab  Result Value Ref  Range Status   SARS Coronavirus 2 NEGATIVE NEGATIVE Final    Comment: (NOTE) SARS-CoV-2 target nucleic acids are NOT DETECTED.  The SARS-CoV-2 RNA is generally detectable in upper and lower respiratory specimens during the acute phase of infection. Negative results do not preclude SARS-CoV-2 infection, do not rule out co-infections with other pathogens, and should not be used as the sole basis for treatment or other patient management decisions. Negative results must be combined with clinical observations, patient history, and epidemiological information. The expected result is Negative.  Fact Sheet for Patients: HairSlick.no  Fact Sheet for Healthcare Providers: quierodirigir.com  This test is not yet approved or cleared by the [place] FDA and  has been authorized for detection and/or diagnosis of SARS-CoV-2 by FDA under an Emergency Use Authorization (EUA). This EUA will remain  in effect (meaning this test can be used) for the duration of the COVID-19 declaration under Se ction 564(b)(1) of the Act, 21 U.S.C. section 360bbb-3(b)(1), unless the authorization is terminated or revoked sooner.  Performed at [facility], [address] [place], [place] [address]   Aerobic/Anaerobic Culture w Gram Stain (surgical/deep wound)     Status: None   Collection Time: 05/19/21 12:36 PM   Specimen: Wound  Result Value Ref Range Status   Specimen Description   Final    WOUND Performed at [facility], [address], [place], [place] [address]    Special Requests   Final    NONE Performed at [facility], [address] [place], [place] [address]    Gram Stain   Final    RARE WBC PRESENT, PREDOMINANTLY MONONUCLEAR RARE GRAM POSITIVE COCCI    Culture   Final    RARE STAPHYLOCOCCUS EPIDERMIDIS NO ANAEROBES ISOLATED Performed at [facility], [address], [place], [place] [address]     Report Status 05/24/2021 FINAL  Final   Organism ID, Bacteria STAPHYLOCOCCUS EPIDERMIDIS  Final      Susceptibility   Staphylococcus epidermidis - MIC*    CIPROFLOXACIN <=0.5 SENSITIVE Sensitive     ERYTHROMYCIN >=  8 RESISTANT Resistant     GENTAMICIN <=0.5 SENSITIVE Sensitive     OXACILLIN <=0.25 SENSITIVE Sensitive     TETRACYCLINE >=16 RESISTANT Resistant     VANCOMYCIN 1 SENSITIVE Sensitive     TRIMETH/SULFA <=10 SENSITIVE Sensitive     CLINDAMYCIN <=0.25 SENSITIVE Sensitive     RIFAMPIN <=0.5 SENSITIVE Sensitive     Inducible Clindamycin NEGATIVE Sensitive     * RARE STAPHYLOCOCCUS EPIDERMIDIS  MRSA PCR Screening     Status: None   Collection Time: 05/20/21  3:50 PM  Result Value Ref Range Status   MRSA by PCR NEGATIVE NEGATIVE Final    Comment:        The GeneXpert MRSA Assay (FDA approved for NASAL specimens only), is one component of a comprehensive MRSA colonization surveillance program. It is not intended to diagnose MRSA infection nor to guide or monitor treatment for MRSA infections. Performed at [facility], [address] [place], [place] [address]          Radiology Studies: No results found.      Scheduled Meds:  vitamin C  250 mg Oral BID   aspirin  81 mg Oral Daily   Chlorhexidine Gluconate Cloth  6 each Topical Daily   enoxaparin (LOVENOX) injection  40 mg Subcutaneous Q24H   fenofibrate  160 mg Oral Daily   insulin aspart  0-5 Units Subcutaneous QHS   insulin aspart  0-9 Units Subcutaneous TID WC   metoprolol tartrate  2.5 mg Intravenous Q6H   multivitamin with minerals  1 tablet Oral Daily   mupirocin ointment   Topical Daily   pantoprazole (PROTONIX) IV  40 mg Intravenous Q24H   phosphorus  500 mg Oral Daily   sodium chloride flush  10 mL Intravenous Q12H   tamsulosin  0.4 mg Oral QPC supper   Continuous Infusions:   ceFAZolin (ANCEF) IV 2 g (05/26/21 0547)   lactated ringers 75 mL/hr at 05/25/21 1415     LOS: 7 days        Silvano Bilis, MD [facility]   To contact the attending provider between 7A-7P or the covering provider during after hours 7P-7A, please log into the web site [email or web address] and access using universal [place] password for that web site. If you do not have the password, please call the hospital operator.  05/26/2021, 10:26 AM

## 2021-05-26 NOTE — Progress Notes (Signed)
Occupational Therapy Treatment Patient Details Name: Rodney Kelly MRN: [id number] DOB: 1924-06-24 Today's Date: 05/26/2021    History of present illness Per MD notes: Pt is a 87 y.o. male with medical history significant of DM, HLD, PCD, GERD, CAD, MI, diabetic foot ulcer, dementia who presents with foot ulcer with infection, work-up significant for osteomyelitis, patient was seen by vascular surgery, no further work-up from their side, he has been followed by podiatry.  MRI significant for osteomyelitis, patient with severe hospital delirium, on 6/17 early a.m., patient with hypoxia, new oxygen requirement and chest x-ray showing aspiration in right lung base.  Hospital course complicated by severe delirium causing severe dysphagia, where he is strictly n.p.o. even for medications.  MD assessment summary includes: Right foot cellulitis/myofasciitis with abscess, osteomyelitis right great toe and first metatarsal bone, acute toxic encephalopathy/hospital delirium, aspiration pneumonia, sinus tachycardia, hypokalemia, elevated D-dimers with (-) LE dopplers, and urinary retention.   OT comments  Rodney Kelly was seen for OT treatment on this date. Upon arrival to room pt reclined in chair, son and sitter present at bedside. SETUP + MOD cues self-feeding - pt requires assist to scoop appropriate sizes and cues for safety swallowing. MAX A don/doff B post ops shoes seated in chair.  MAX A x2 perihygiene in standing - of note pt requires B knee block for sit<>stand 2/2 feet sliding in B post op shoes.   Sitter at bedside notified of pt WBAT in post-op shoes only. Pt making progress toward goals. Pt continues to benefit from skilled OT services to maximize return to PLOF and minimize risk of future falls, injury, caregiver burden, and readmission. Will continue to follow POC. Discharge recommendation remains appropriate.    Follow Up Recommendations  SNF    Equipment Recommendations  3 in 1  bedside commode    Recommendations for Other Services      Precautions / Restrictions Precautions Precautions: Fall Restrictions Weight Bearing Restrictions: Yes RLE Weight Bearing: Weight bearing as tolerated LLE Weight Bearing: Weight bearing as tolerated Other Position/Activity Restrictions: BLE WBAT with post-op shoes donned per Dr. Excell Seltzer       Mobility Bed Mobility               General bed mobility comments: pt received and left in chair    Transfers Overall transfer level: Needs assistance Equipment used: Rolling walker (2 wheeled) Transfers: Sit to/from Stand Sit to Stand: Max assist;+2 physical assistance         General transfer comment: B knee block, feet slide in post op shoes    Balance Overall balance assessment: Needs assistance Sitting-balance support: No upper extremity supported;Feet supported Sitting balance-Leahy Scale: Fair     Standing balance support: Bilateral upper extremity supported;During functional activity Standing balance-Leahy Scale: Zero                             ADL either performed or assessed with clinical judgement   ADL Overall ADL's : Needs assistance/impaired                                       General ADL Comments: MAX A x2 perihygiene in standing. SETUP + MOD cues self-feeding - pt requires assist to scoop appropriate sizes and cues for safety swallowing. MAX Adon/doff B post ops shoes seated in chair  Cognition Arousal/Alertness: Awake/alert Behavior During Therapy: Flat affect Overall Cognitive Status: History of cognitive impairments - at baseline                                 General Comments: Pt A&O x 0 but able to follow simple 1-step commands with extra time and cuing.        Exercises Exercises: Other exercises Other Exercises Other Exercises: Pt and family educated re: DME recs, d/c recs, falls prevention, ECS, WBing pcns Other Exercises:  self-feeding, LBD, toileting, sit<>stand x3, sitting/standing blaance/tolerance           Pertinent Vitals/ Pain       Pain Assessment: No/denies pain         Frequency  Min 1X/week        Progress Toward Goals  OT Goals(current goals can now be found in the care plan section)  Progress towards OT goals: Progressing toward goals  Acute Rehab OT Goals Patient Stated Goal: to go home OT Goal Formulation: With patient/family Time For Goal Achievement: 06/08/21 Potential to Achieve Goals: Fair ADL Goals Pt Will Perform Eating: with set-up;with supervision;sitting Pt Will Perform Grooming: with min assist;sitting Pt Will Perform Upper Body Dressing: with supervision;sitting Pt Will Transfer to Toilet: with min assist;stand pivot transfer;bedside commode  Plan Discharge plan remains appropriate;Frequency remains appropriate       AM-PAC OT "6 Clicks" Daily Activity     Outcome Measure   Help from another person eating meals?: A Little Help from another person taking care of personal grooming?: A Little Help from another person toileting, which includes using toliet, bedpan, or urinal?: A Lot Help from another person bathing (including washing, rinsing, drying)?: A Lot Help from another person to put on and taking off regular upper body clothing?: A Lot Help from another person to put on and taking off regular lower body clothing?: A Lot 6 Click Score: 14    End of Session Equipment Utilized During Treatment: Rolling walker  OT Visit Diagnosis: Unsteadiness on feet (R26.81);Muscle weakness (generalized) (M62.81)   Activity Tolerance Patient tolerated treatment well   Patient Left in bed;with call bell/phone within reach;with nursing/sitter in room;with family/visitor present   Nurse Communication Other (comment);Weight bearing status (required post op shoe when standing)        Time: 0940-1009 OT Time Calculation (min): 29 min  Charges: OT General Charges $OT  Visit: 1 Visit OT Treatments $Self Care/Home Management : 23-37 mins  Kathie Dike, M.S. OTR/L  05/26/21, 10:42 AM  ascom [phone]

## 2021-05-26 NOTE — Progress Notes (Signed)
Date of Admission:  05/19/2021   Total days of antibiotics -day 8 ID: Rodney Kelly is a 87 y.o. male  Principal Problem:   Diabetic foot infection ([place]) Active Problems:   Coronary artery disease   Type 2 diabetes mellitus with complication, without long-term current use of insulin (HCC)   Diabetic foot ulcers (HCC)   Hyperlipemia   GERD (gastroesophageal reflux disease)   Normocytic anemia   Cellulitis of right lower extremity    Subjective: Pt says he is feeling fine and wants to go home   Medications:   vitamin C  250 mg Oral BID   aspirin  81 mg Oral Daily   Chlorhexidine Gluconate Cloth  6 each Topical Daily   enoxaparin (LOVENOX) injection  40 mg Subcutaneous Q24H   fenofibrate  160 mg Oral Daily   insulin aspart  0-5 Units Subcutaneous QHS   insulin aspart  0-9 Units Subcutaneous TID WC   multivitamin with minerals  1 tablet Oral Daily   mupirocin ointment   Topical Daily   pantoprazole  40 mg Oral Daily   phosphorus  500 mg Oral Daily   potassium chloride  40 mEq Oral Once   sodium chloride flush  10 mL Intravenous Q12H   tamsulosin  0.4 mg Oral QPC supper    Objective: Vital signs in last 24 hours: Temp:  [97.4 F (36.3 C)-99.1 F (37.3 C)] 97.4 F (36.3 C) (06/22 0743) Pulse Rate:  [72-86] 86 (06/22 0743) Resp:  [15-17] 15 (06/22 0743) BP: (121-141)/(57-79) 141/79 (06/22 0743) SpO2:  [96 %-99 %] 96 % (06/22 0743)  PHYSICAL EXAM:  General: Alert, less cooperative, no distress, knows he is in the hospital but not oriented to time Responds to questions Feisty and funny response- slight belligerence Head: Normocephalic, without obvious abnormality, atraumatic. Eyes: Conjunctivae clear, anicteric sclerae. Pupils are equal ENT Nares normal. No drainage or sinus tenderness. Lips, mucosa, and tongue normal. No Thrush Neck: Supple, symmetrical, no adenopathy, thyroid: non tender no carotid bruit and no JVD. Back: No CVA tenderness. Lungs:  Clear to auscultation bilaterally. No Wheezing or Rhonchi. No rales. Heart: s1s2. Abdomen: Soft, non-tender,not distended. Bowel sounds normal. No masses Extremities: rt foot mild edema   Skin: macular rash over back, armpits     Lymph: Cervical, supraclavicular normal. Neurologic: Grossly non-focal  Lab Results Recent Labs    05/25/21 0535 05/26/21 0423  WBC 7.2 6.1  HGB 10.7* 9.9*  HCT 32.9* 31.0*  NA 139 139  K 3.3* 3.3*  CL 104 104  CO2 27 30  BUN 16 13  CREATININE 0.58* 0.62   ESR 117 on 05/19/21 CRP 12.5 on6/15/22 Microbiology: 05/19/21- staph epi in wound is a skin bacteria and not the reason for his wound Studies/Results: MRI foot Small open wound on the plantar aspect of the medial forefoot at the level of the first MTP joint. Associated underlying soft tissue abscess. 2. Associated osteomyelitis involving the medial sesamoid of the great toe and also the proximal phalanx of the great toe. 3. Suspect osteomyelitis involving the first metatarsal head along its plantar aspect.   Assessment/Plan: Rt foot wound on the 1st MTP area and osteomyelitis of the underlying bone die to pressure and shape of foot Culture from 05/10/21 and 05/19/21 no growth Was on cefepime + flagyl and then cefazolin which will be discontinued today because of a maculopapular rash  Rash- likely allergic rash to cephalosporin could be cefepime or cefazolin Will Dc Pt stable from infection  standpoint so will not start any antibiotics now If he is taken for surgery we can start Vanco  Aspiration pneumonitis resolved  Encephalopathy resolved Discussed with Dr. Si Raider. He is planning to clear him for surgery

## 2021-05-27 DIAGNOSIS — E11628 Type 2 diabetes mellitus with other skin complications: Secondary | ICD-10-CM | POA: Diagnosis not present

## 2021-05-27 DIAGNOSIS — L089 Local infection of the skin and subcutaneous tissue, unspecified: Secondary | ICD-10-CM | POA: Diagnosis not present

## 2021-05-27 LAB — GLUCOSE, CAPILLARY
Glucose-Capillary: 140 mg/dL — ABNORMAL HIGH (ref 70–99)
Glucose-Capillary: 148 mg/dL — ABNORMAL HIGH (ref 70–99)
Glucose-Capillary: 204 mg/dL — ABNORMAL HIGH (ref 70–99)
Glucose-Capillary: 173 mg/dL — ABNORMAL HIGH (ref 70–99)

## 2021-05-27 LAB — CBC
HCT: 33.7 % — ABNORMAL LOW (ref 39.0–52.0)
Hemoglobin: 10.9 g/dL — ABNORMAL LOW (ref 13.0–17.0)
MCH: 28.5 pg (ref 26.0–34.0)
MCHC: 32.3 g/dL (ref 30.0–36.0)
MCV: 88.2 fL (ref 80.0–100.0)
Platelets: 358 10*3/uL (ref 150–400)
RBC: 3.82 MIL/uL — ABNORMAL LOW (ref 4.22–5.81)
RDW: 15.9 % — ABNORMAL HIGH (ref 11.5–15.5)
WBC: 7.4 10*3/uL (ref 4.0–10.5)
nRBC: 0 % (ref 0.0–0.2)

## 2021-05-27 LAB — MAGNESIUM: Magnesium: 2 mg/dL (ref 1.7–2.4)

## 2021-05-27 LAB — BASIC METABOLIC PANEL
Anion gap: 8 (ref 5–15)
BUN: 12 mg/dL (ref 8–23)
CO2: 32 mmol/L (ref 22–32)
Calcium: 9.3 mg/dL (ref 8.9–10.3)
Chloride: 103 mmol/L (ref 98–111)
Creatinine, Ser: 0.66 mg/dL (ref 0.61–1.24)
GFR, Estimated: >60 mL/min (ref >60)
Glucose, Bld: 134 mg/dL — ABNORMAL HIGH (ref 70–99)
Potassium: 3.7 mmol/L (ref 3.5–5.1)
Sodium: 143 mmol/L (ref 135–145)

## 2021-05-27 MED ORDER — CHLORHEXIDINE GLUCONATE 4 % EX LIQD
60.0000 mL | Freq: Once | CUTANEOUS | Status: AC
  Administered 2021-05-28: 4 via TOPICAL

## 2021-05-27 MED ORDER — METFORMIN HCL 500 MG PO TABS
1000.0000 mg | ORAL_TABLET | Freq: Two times a day (BID) | ORAL | Status: DC
  Administered 2021-05-27 – 2021-06-08 (×23): 1000 mg via ORAL
  Filled 2021-05-27 (×22): qty 2

## 2021-05-27 MED ORDER — POVIDONE-IODINE 10 % EX SWAB
2.0000 "application " | Freq: Once | CUTANEOUS | Status: AC
  Administered 2021-05-28: 2 via TOPICAL

## 2021-05-27 NOTE — Progress Notes (Signed)
   Date of Admission:  05/19/2021    ID: Rodney Kelly is a 87 y.o. male with   Principal Problem:   Diabetic foot infection ([place]) Active Problems:   Coronary artery disease   Type 2 diabetes mellitus with complication, without long-term current use of insulin (HCC)   Diabetic foot ulcers (HCC)   Hyperlipemia   GERD (gastroesophageal reflux disease)   Normocytic anemia   Cellulitis of right lower extremity    Subjective: Sitting in chair Says he wants to go home  Medications:   vitamin C  250 mg Oral BID   aspirin  81 mg Oral Daily   Chlorhexidine Gluconate Cloth  6 each Topical Daily   enoxaparin (LOVENOX) injection  40 mg Subcutaneous Q24H   fenofibrate  160 mg Oral Daily   metFORMIN  1,000 mg Oral BID WC   multivitamin with minerals  1 tablet Oral Daily   mupirocin ointment   Topical Daily   pantoprazole  40 mg Oral Daily   phosphorus  500 mg Oral Daily   sodium chloride flush  10 mL Intravenous Q12H   tamsulosin  0.4 mg Oral QPC supper    Objective: Vital signs in last 24 hours: Temp:  [97.8 F (36.6 C)-98.6 F (37 C)] 98.3 F (36.8 C) (06/23 1136) Pulse Rate:  [74-85] 74 (06/23 1136) Resp:  [16-17] 17 (06/23 1136) BP: (134-148)/(59-72) 145/62 (06/23 1136) SpO2:  [95 %-100 %] 100 % (06/23 1136)  PHYSICAL EXAM:  General: Alert, cooperative, oriented in person Head: Normocephalic, without obvious abnormality, atraumatic. Eyes: Conjunctivae clear, anicteric sclerae. Pupils are equal ENT Nares normal. No drainage or sinus tenderness. Lips, mucosa, and tongue normal. No Thrush Neck: Supple, symmetrical, no adenopathy, thyroid: non tender no carotid bruit and no JVD. Back: No CVA tenderness. Lungs: Clear to auscultation bilaterally. No Wheezing or Rhonchi. No rales. Heart: Regular rate and rhythm, no murmur, rub or gallop. Abdomen: Soft, non-tender,not distended. Bowel sounds normal. No masses Extremities: rt foot  Skin:maculopapular rash over back  /axillae-  Lymph: Cervical, supraclavicular normal. Neurologic: Grossly non-focal  Lab Results Recent Labs    05/26/21 0423 05/26/21 1132 05/27/21 0602  WBC 6.1 7.2 7.4  HGB 9.9* 10.1* 10.9*  HCT 31.0* 31.8* 33.7*  NA 139  --  143  K 3.3*  --  3.7  CL 104  --  103  CO2 30  --  32  BUN 13  --  12  CREATININE 0.62  --  0.66     ESR 117 on 05/19/21 CRP 12.5 on6/15/22 Microbiology: 05/19/21- staph epi in wound is a skin bacteria and not the reason for his wound Studies/Results: MRI foot Small open wound on the plantar aspect of the medial forefoot at the level of the first MTP joint. Associated underlying soft tissue abscess. 2. Associated osteomyelitis involving the medial sesamoid of the great toe and also the proximal phalanx of the great toe. 3. Suspect osteomyelitis involving the first metatarsal head along its plantar aspect.   Assessment/Plan:  Right foot wound on the first MTP area and osteomyelitis of the Bone. The culture from 05/10/2021 and 05/19/2020 no growth. Was on cefepime plus Flagyl and then cefazolin which was discontinued yesterday because of rash. Patient is stable  can start vancomycin tomorrow  Rash secondary to cephalosporins  Aspiration pneumonitis resolved  Encephalopathy resolved Discussed the management with care team.

## 2021-05-27 NOTE — Plan of Care (Signed)
  Problem: Education: Goal: Knowledge of General Education information will improve Description: Including pain rating scale, medication(s)/side effects and non-pharmacologic comfort measures Outcome: Progressing   Problem: Health Behavior/Discharge Planning: Goal: Ability to manage health-related needs will improve Outcome: Progressing   Problem: Clinical Measurements: Goal: Ability to maintain clinical measurements within normal limits will improve Outcome: Progressing Goal: Will remain free from infection Outcome: Progressing Goal: Diagnostic test results will improve Outcome: Progressing Goal: Respiratory complications will improve Outcome: Progressing Goal: Cardiovascular complication will be avoided Outcome: Progressing   Problem: Activity: Goal: Risk for activity intolerance will decrease Outcome: Progressing   Problem: Nutrition: Goal: Adequate nutrition will be maintained Outcome: Progressing   Problem: Coping: Goal: Level of anxiety will decrease Outcome: Progressing   Problem: Elimination: Goal: Will not experience complications related to bowel motility Outcome: Progressing Goal: Will not experience complications related to urinary retention Outcome: Progressing   Problem: Pain Managment: Goal: General experience of comfort will improve Outcome: Progressing   Problem: Safety: Goal: Ability to remain free from injury will improve Outcome: Progressing   Problem: Skin Integrity: Goal: Risk for impaired skin integrity will decrease Outcome: Progressing   Problem: Education: Goal: Ability to describe self-care measures that may prevent or decrease complications (Diabetes Survival Skills Education) will improve Outcome: Progressing Goal: Individualized Educational Video(s) Outcome: Progressing   Problem: Coping: Goal: Ability to adjust to condition or change in health will improve Outcome: Progressing   Problem: Fluid Volume: Goal: Ability to  maintain a balanced intake and output will improve Outcome: Progressing   Problem: Health Behavior/Discharge Planning: Goal: Ability to identify and utilize available resources and services will improve Outcome: Progressing Goal: Ability to manage health-related needs will improve Outcome: Progressing   Problem: Metabolic: Goal: Ability to maintain appropriate glucose levels will improve Outcome: Progressing   Problem: Nutritional: Goal: Maintenance of adequate nutrition will improve Outcome: Progressing   Problem: Nutritional: Goal: Maintenance of adequate nutrition will improve Outcome: Progressing Goal: Progress toward achieving an optimal weight will improve Outcome: Progressing   Problem: Tissue Perfusion: Goal: Adequacy of tissue perfusion will improve Outcome: Progressing

## 2021-05-27 NOTE — Progress Notes (Signed)
Daily Progress Note   Subjective  - 6 Days Post-Op  Follow-up right foot ulceration.  Still somewhat confused today but much better than previous.  Objective Vitals:   05/26/21 2025 05/26/21 2325 05/27/21 0806 05/27/21 1136  BP: 134/63 (!) 148/72 (!) 138/59 (!) 145/62  Pulse: 82 85 74 74  Resp: 17 16 16 17   Temp: 98.6 F (37 C) 98.5 F (36.9 C) 97.8 F (36.6 C) 98.3 F (36.8 C)  TempSrc:      SpO2: 98% 95% 98% 100%  Weight:      Height:        Physical Exam: Still open ulceration to the plantar aspect of the right foot probes down to bone.  Still drainage noted.  Erythema has improved to the right foot.  Laboratory CBC    Component Value Date/Time   WBC 7.4 05/27/2021 0602   HGB 10.9 (L) 05/27/2021 0602   HCT 33.7 (L) 05/27/2021 0602   PLT 358 05/27/2021 0602    BMET    Component Value Date/Time   NA 143 05/27/2021 0602   K 3.7 05/27/2021 0602   CL 103 05/27/2021 0602   CO2 32 05/27/2021 0602   GLUCOSE 134 (H) 05/27/2021 0602   BUN 12 05/27/2021 0602   CREATININE 0.66 05/27/2021 0602   CALCIUM 9.3 05/27/2021 0602   GFRNONAA >60 05/27/2021 0602   GFRAA >60 09/28/2018 0749    Assessment/Planning: Osteomyelitis right first MTPJ  We will plan on surgical intervention tomorrow.  I discussed over the phone with his son the need for surgical intervention.  We will plan on a partial first ray amputation.  I discussed the risks and benefits associated with surgery including possible nonhealing and further surgery down the road.  He understood this and verbal consent was given over the phone.  Orders will be placed for this evening and plan for surgery tomorrow.   09/30/2018 A  05/27/2021, 12:44 PM

## 2021-05-27 NOTE — Progress Notes (Signed)
PROGRESS NOTE    Rodney Kelly  QQV:[id number] DOB: 03-15-1924 DOA: 05/19/2021 PCP: Wilkie Aye, MD    Chief Complaint  Patient presents with   Wound Infection    Brief Narrative:   87 y.o. male with medical history significant of DM, HLD, PCD, GERD, CAD, MI, diabetic foot ulcer, dementia who presents with foot ulcer with infection, work-up significant for osteomyelitis, patient was seen by vascular surgery, no further work-up from their side, he has been followed by podiatry.  MRI significant for osteomyelitis, patient with severe hospital delirium, on 6/17 early a.m., patient with hypoxia, new oxygen requirement and chest x-ray showing aspiration in right lung base.  Hospital course complicated by severe delirium, aspiration pneumonia with dysphagia.  Assessment & Plan:   Principal Problem:   Diabetic foot infection (HCC) Active Problems:   Coronary artery disease   Type 2 diabetes mellitus with complication, without long-term current use of insulin (HCC)   Diabetic foot ulcers (HCC)   Hyperlipemia   GERD (gastroesophageal reflux disease)   Normocytic anemia   Cellulitis of right lower extremity   Right foot, cellulitis/myofasciitis with abscess,  osteomyelitis right great toe and first metatarsal bone. - Patient failed outpatient oral antibiotic doxycycline treatment.  -No evidence of DVT on venous Dopplers -discussed w/ podiatry. Amputation initially on hold given acute delirium and aspiration pneumonia. Pt's aspiration pneumonia/itis is resolved and is close to baseline, plan now to proceed w/ amputation likely 6/24, will touch base w/ podiatry today to confirm that - abx on hold given rash w/ cefazolin  Rash Macular rash on back present since at least 6/21. likely drug reaction, ID agrees. Does appear to be slowly improving - cefazolin on hold per ID - if rash clears w/ holding cefazolin will add to allergies  Acute metabolic encephalopathy/severe  hospital delirium -Patient with severe hospital delirium, he is having a bedside sitter, as needed Haldol as well, trying to minimize narcotics and benzo diazepam, increased frequency of reorientation, trying to correct day/night cycle. -Continue with bedside sitter -I have discussed with son, encouraged him to have him or family member stay next to patient to have familiar faces. - CT head with no acute findings - appears to be improving  Aspiration pneumonia -Chest x-ray significant for right lung base opacity, on antibiotic coverage, seen closely by SLP, currently advanced to dysphagia 1 with nectar thick. S/p 5 days flagyl  Sinus tachycardia -Discussed EKG finding on 6/19 with Dr. Juliann Pares  who eviewed the EKG, doubt it is A. fib, most likely sinus tachycardia with PACs versus SVT, - hold iv metoprolol for nlw  Hypokalemia Resolved - monitor   Coronary artery disease:  -No CP. S/p of stent per his son -ASA, tricor, Imdur -prn NGT  Elevated D-dimers -Lower extremity Dopplers negative for DVT, continue with Lovenox 40 mg subcu daily.  Urinary retention -Foley catheter inserted 6/17, now removed,  started on Flomax.     Type 2 diabetes mellitus with complication of foot ulcer, without long-term current use of insulin (HCC): Recent A1c 6.4, well controlled.  Patient taking metformin at home - stop sliding scale and resume home metformin - monitor daily fasting sugar   Hyperlipemia -Fenofibrate -Patient is not taking Lipitor currently   GERD (gastroesophageal reflux disease) -Protonix   Normocytic anemia:  -Plan, this is most likely anemia of chronic disease    Son requested patient to be transferred to [facility], previous provider have called [facility] to requested transfer on 6/19, have discussed with  internist on-call Dr. Seth Bake, given there is no indication for transfer there, and all services available at our facility, they have declined transfer especially they are at  capacity, I have updated the son at  DVT prophylaxis: lovenox Code Status: Full Family Communication: son updated @ bedside 6/22 Disposition:   Status is: Inpatient  Remains inpatient appropriate because:IV treatments appropriate due to intensity of illness or inability to take PO  Dispo: The patient is from: Home              Anticipated d/c is to: Home              Patient currently is not medically stable to d/c.   Difficult to place patient No       Consultants:  Podiatry Vascular surgery ID  Subjective:  No significant events overnight, rash on back slightly improved. Breathing comfortably, tolerating diet.   Objective: Vitals:   05/26/21 2025 05/26/21 2325 05/27/21 0806 05/27/21 1136  BP: 134/63 (!) 148/72 (!) 138/59 (!) 145/62  Pulse: 82 85 74 74  Resp: 17 16 16 17   Temp: 98.6 F (37 C) 98.5 F (36.9 C) 97.8 F (36.6 C) 98.3 F (36.8 C)  TempSrc:      SpO2: 98% 95% 98% 100%  Weight:      Height:        Intake/Output Summary (Last 24 hours) at 05/27/2021 1215 Last data filed at 05/27/2021 0810 Gross per 24 hour  Intake 240 ml  Output 1800 ml  Net -1560 ml    Filed Weights   05/19/21 0722  Weight: 59 kg    Examination:  Frail elderly patient, sitting in recliner, with fluctuating mental status, confused, frail  Symmetrical Chest wall movement, rales at bases RRR,No Gallops,Rubs or new Murmurs,  +ve B.Sounds, Abd Soft, No tenderness, No rebound - guarding or rigidity. No Cyanosis, bilateral feet with Ace wrap.   Data Reviewed: I have personally reviewed following labs and imaging studies  CBC: Recent Labs  Lab 05/24/21 0422 05/25/21 0535 05/26/21 0423 05/26/21 1132 05/27/21 0602  WBC 7.2 7.2 6.1 7.2 7.4  NEUTROABS  --   --   --  5.1  --   HGB 10.5* 10.7* 9.9* 10.1* 10.9*  HCT 32.0* 32.9* 31.0* 31.8* 33.7*  MCV 86.3 86.8 87.1 89.1 88.2  PLT 376 343 346 349 358    Basic Metabolic Panel: Recent Labs  Lab 05/22/21 2336  05/23/21 0514 05/24/21 0422 05/25/21 0535 05/26/21 0423 05/27/21 0602  NA  --  139 139 139 139 143  K  --  3.5 3.5 3.3* 3.3* 3.7  CL  --  105 104 104 104 103  CO2  --  23 24 27 30  32  GLUCOSE  --  123* 108* 133* 130* 134*  BUN  --  13 14 16 13 12   CREATININE  --  0.71 0.76 0.58* 0.62 0.66  CALCIUM  --  9.2 8.8* 9.1 8.8* 9.3  MG 1.7  --   --   --  1.8 2.0  PHOS  --  2.5  --  2.6 2.5  --     GFR: Estimated Creatinine Clearance: 47.1 mL/min (by C-G formula based on SCr of 0.66 mg/dL).  Liver Function Tests: No results for input(s): AST, ALT, ALKPHOS, BILITOT, PROT, ALBUMIN in the last 168 hours.   CBG: Recent Labs  Lab 05/26/21 1122 05/26/21 1643 05/26/21 2110 05/27/21 0805 05/27/21 1131  GLUCAP 144* 170* 159* 140* 148*  Recent Results (from the past 240 hour(s))  Blood Cultures x 2 sites     Status: None   Collection Time: 05/19/21  9:35 AM   Specimen: BLOOD  Result Value Ref Range Status   Specimen Description BLOOD LEFT AC  Final   Special Requests   Final    BOTTLES DRAWN AEROBIC AND ANAEROBIC Blood Culture results may not be optimal due to an excessive volume of blood received in culture bottles   Culture   Final    NO GROWTH 5 DAYS Performed at [facility], [address], [place], [place] [address]    Report Status 05/24/2021 FINAL  Final  Blood Cultures x 2 sites     Status: None   Collection Time: 05/19/21  9:35 AM   Specimen: BLOOD  Result Value Ref Range Status   Specimen Description BLOOD  RIGHT [facility]  Final   Special Requests   Final    BOTTLES DRAWN AEROBIC AND ANAEROBIC Blood Culture adequate volume   Culture   Final    NO GROWTH 5 DAYS Performed at [facility], [address], [place], [place] [address]    Report Status 05/24/2021 FINAL  Final  SARS CORONAVIRUS 2 (TAT 6-24 HRS) Nasopharyngeal Nasopharyngeal Swab     Status: None   Collection Time: 05/19/21  9:35 AM   Specimen: Nasopharyngeal Swab  Result Value Ref  Range Status   SARS Coronavirus 2 NEGATIVE NEGATIVE Final    Comment: (NOTE) SARS-CoV-2 target nucleic acids are NOT DETECTED.  The SARS-CoV-2 RNA is generally detectable in upper and lower respiratory specimens during the acute phase of infection. Negative results do not preclude SARS-CoV-2 infection, do not rule out co-infections with other pathogens, and should not be used as the sole basis for treatment or other patient management decisions. Negative results must be combined with clinical observations, patient history, and epidemiological information. The expected result is Negative.  Fact Sheet for Patients: HairSlick.no  Fact Sheet for Healthcare Providers: quierodirigir.com  This test is not yet approved or cleared by the [place] FDA and  has been authorized for detection and/or diagnosis of SARS-CoV-2 by FDA under an Emergency Use Authorization (EUA). This EUA will remain  in effect (meaning this test can be used) for the duration of the COVID-19 declaration under Se ction 564(b)(1) of the Act, 21 U.S.C. section 360bbb-3(b)(1), unless the authorization is terminated or revoked sooner.  Performed at [facility], [address] [place], [place] [address]   Aerobic/Anaerobic Culture w Gram Stain (surgical/deep wound)     Status: None   Collection Time: 05/19/21 12:36 PM   Specimen: Wound  Result Value Ref Range Status   Specimen Description   Final    WOUND Performed at [facility], [address], [place], [place] [address]    Special Requests   Final    NONE Performed at [facility], [address] [place], [place] [address]    Gram Stain   Final    RARE WBC PRESENT, PREDOMINANTLY MONONUCLEAR RARE GRAM POSITIVE COCCI    Culture   Final    RARE STAPHYLOCOCCUS EPIDERMIDIS NO ANAEROBES ISOLATED Performed at [facility], [address], [place], [place] [address]     Report Status 05/24/2021 FINAL  Final   Organism ID, Bacteria STAPHYLOCOCCUS EPIDERMIDIS  Final      Susceptibility   Staphylococcus epidermidis - MIC*    CIPROFLOXACIN <=0.5 SENSITIVE Sensitive     ERYTHROMYCIN >=  8 RESISTANT Resistant     GENTAMICIN <=0.5 SENSITIVE Sensitive     OXACILLIN <=0.25 SENSITIVE Sensitive     TETRACYCLINE >=16 RESISTANT Resistant     VANCOMYCIN 1 SENSITIVE Sensitive     TRIMETH/SULFA <=10 SENSITIVE Sensitive     CLINDAMYCIN <=0.25 SENSITIVE Sensitive     RIFAMPIN <=0.5 SENSITIVE Sensitive     Inducible Clindamycin NEGATIVE Sensitive     * RARE STAPHYLOCOCCUS EPIDERMIDIS  MRSA PCR Screening     Status: None   Collection Time: 05/20/21  3:50 PM  Result Value Ref Range Status   MRSA by PCR NEGATIVE NEGATIVE Final    Comment:        The GeneXpert MRSA Assay (FDA approved for NASAL specimens only), is one component of a comprehensive MRSA colonization surveillance program. It is not intended to diagnose MRSA infection nor to guide or monitor treatment for MRSA infections. Performed at [facility], [address] [place], [place] [address]          Radiology Studies: No results found.      Scheduled Meds:  vitamin C  250 mg Oral BID   aspirin  81 mg Oral Daily   Chlorhexidine Gluconate Cloth  6 each Topical Daily   enoxaparin (LOVENOX) injection  40 mg Subcutaneous Q24H   fenofibrate  160 mg Oral Daily   insulin aspart  0-5 Units Subcutaneous QHS   insulin aspart  0-9 Units Subcutaneous TID WC   multivitamin with minerals  1 tablet Oral Daily   mupirocin ointment   Topical Daily   pantoprazole  40 mg Oral Daily   phosphorus  500 mg Oral Daily   sodium chloride flush  10 mL Intravenous Q12H   tamsulosin  0.4 mg Oral QPC supper   Continuous Infusions:     LOS: 8 days       Silvano BilisNoah B Keyanna Sandefer, MD [facility]   To contact the attending provider between 7A-7P or the covering provider during after hours 7P-7A, please  log into the web site [email or web address] and access using universal [place] password for that web site. If you do not have the password, please call the hospital operator.  05/27/2021, 12:15 PM

## 2021-05-27 NOTE — TOC Progression Note (Signed)
Transition of Care [facility]) - Progression Note    Patient Details  Name: Kayn Haymore MRN: [id number] Date of Birth: 1924-05-03  [facility] CM/SW Contact  Caryn Section, RN Phone Number: 05/27/2021, 12:44 PM  Clinical Narrative:   Per MD reports, patient will have amputation tomorrow.  TOC will speak to son about SNF recommendations.    Expected Discharge Plan: Home w Home Health Services Barriers to Discharge: Continued Medical Work up  Expected Discharge Plan and Services Expected Discharge Plan: Home w Home Health Services   Discharge Planning Services: CM Consult Post Acute Care Choice: Home Health                                         Social Determinants of Health (SDOH) Interventions    Readmission Risk Interventions No flowsheet data found.

## 2021-05-28 ENCOUNTER — Inpatient Hospital Stay: Payer: Medicare PPO | Admitting: Certified Registered Nurse Anesthetist

## 2021-05-28 ENCOUNTER — Encounter
Admission: EM | Disposition: A | Discharge status: Skilled Nursing Facility | Payer: PRIVATE HEALTH INSURANCE | Source: Home / Self Care | Attending: Obstetrics and Gynecology

## 2021-05-28 ENCOUNTER — Encounter: Payer: Self-pay | Admitting: Internal Medicine

## 2021-05-28 HISTORY — PX: AMPUTATION: SHX166

## 2021-05-28 LAB — GLUCOSE, CAPILLARY
Glucose-Capillary: 143 mg/dL — ABNORMAL HIGH (ref 70–99)
Glucose-Capillary: 140 mg/dL — ABNORMAL HIGH (ref 70–99)
Glucose-Capillary: 130 mg/dL — ABNORMAL HIGH (ref 70–99)
Glucose-Capillary: 120 mg/dL — ABNORMAL HIGH (ref 70–99)
Glucose-Capillary: 131 mg/dL — ABNORMAL HIGH (ref 70–99)

## 2021-05-28 LAB — GLUCOSE, RANDOM: Glucose, Bld: 132 mg/dL — ABNORMAL HIGH (ref 70–99)

## 2021-05-28 LAB — MAGNESIUM: Magnesium: 1.9 mg/dL (ref 1.7–2.4)

## 2021-05-28 SURGERY — AMPUTATION, FOOT, RAY
Anesthesia: General | Site: Foot | Laterality: Right

## 2021-05-28 MED ORDER — PROPOFOL 500 MG/50ML IV EMUL
INTRAVENOUS | Status: DC | PRN
  Administered 2021-05-28: 50 ug/kg/min via INTRAVENOUS

## 2021-05-28 MED ORDER — ONDANSETRON HCL 4 MG/2ML IJ SOLN
INTRAMUSCULAR | Status: DC | PRN
  Administered 2021-05-28: 4 mg via INTRAVENOUS

## 2021-05-28 MED ORDER — SODIUM CHLORIDE 0.9 % IV SOLN: INTRAVENOUS | Status: DC | PRN

## 2021-05-28 MED ORDER — MIDAZOLAM HCL 2 MG/2ML IJ SOLN
INTRAMUSCULAR | Status: AC
  Filled 2021-05-28: qty 2

## 2021-05-28 MED ORDER — PHENYLEPHRINE HCL (PRESSORS) 10 MG/ML IV SOLN
INTRAVENOUS | Status: AC
  Filled 2021-05-28: qty 1

## 2021-05-28 MED ORDER — VANCOMYCIN HCL 1000 MG IV SOLR
INTRAVENOUS | Status: DC | PRN
  Administered 2021-05-28: 1000 mg

## 2021-05-28 MED ORDER — VANCOMYCIN HCL 1250 MG/250ML IV SOLN
1250.0000 mg | Freq: Once | INTRAVENOUS | Status: AC
  Administered 2021-05-28: 1250 mg via INTRAVENOUS
  Filled 2021-05-28: qty 250

## 2021-05-28 MED ORDER — FENTANYL CITRATE (PF) 100 MCG/2ML IJ SOLN
INTRAMUSCULAR | Status: AC
  Filled 2021-05-28: qty 2

## 2021-05-28 MED ORDER — BUPIVACAINE HCL 0.5 % IJ SOLN
INTRAMUSCULAR | Status: DC | PRN
  Administered 2021-05-28: 30 mL

## 2021-05-28 MED ORDER — FENTANYL CITRATE (PF) 100 MCG/2ML IJ SOLN
INTRAMUSCULAR | Status: DC | PRN
  Administered 2021-05-28 (×2): 25 ug via INTRAVENOUS

## 2021-05-28 MED ORDER — GENTAMICIN SULFATE 40 MG/ML IJ SOLN
INTRAMUSCULAR | Status: AC
  Filled 2021-05-28: qty 4

## 2021-05-28 MED ORDER — VANCOMYCIN HCL IN DEXTROSE 1-5 GM/200ML-% IV SOLN
1000.0000 mg | INTRAVENOUS | Status: DC
  Administered 2021-05-29: 1000 mg via INTRAVENOUS
  Filled 2021-05-28 (×2): qty 200

## 2021-05-28 MED ORDER — LIDOCAINE HCL (PF) 1 % IJ SOLN
INTRAMUSCULAR | Status: DC | PRN
  Administered 2021-05-28: 5 mL

## 2021-05-28 MED ORDER — LIDOCAINE HCL (CARDIAC) PF 100 MG/5ML IV SOSY
PREFILLED_SYRINGE | INTRAVENOUS | Status: DC | PRN
  Administered 2021-05-28: 60 mg via INTRAVENOUS

## 2021-05-28 MED ORDER — 0.9 % SODIUM CHLORIDE (POUR BTL) OPTIME
TOPICAL | Status: DC | PRN
  Administered 2021-05-28: 500 mL

## 2021-05-28 MED ORDER — VANCOMYCIN HCL 1000 MG IV SOLR
INTRAVENOUS | Status: AC
  Filled 2021-05-28: qty 1000

## 2021-05-28 MED ORDER — PROPOFOL 10 MG/ML IV BOLUS
INTRAVENOUS | Status: AC
  Filled 2021-05-28: qty 20

## 2021-05-28 SURGICAL SUPPLY — 48 items
BLADE MED AGGRESSIVE (BLADE) ×2 IMPLANT
BLADE OSC/SAGITTAL MD 5.5X18 (BLADE) IMPLANT
BLADE SURG MINI STRL (BLADE) ×2 IMPLANT
BNDG CONFORM 2 STRL LF (GAUZE/BANDAGES/DRESSINGS) IMPLANT
BNDG CONFORM 3 STRL LF (GAUZE/BANDAGES/DRESSINGS) ×2 IMPLANT
BNDG ELASTIC 4X5.8 VLCR NS LF (GAUZE/BANDAGES/DRESSINGS) ×2 IMPLANT
BNDG ESMARK 4X12 TAN STRL LF (GAUZE/BANDAGES/DRESSINGS) ×2 IMPLANT
BNDG GAUZE 4.5X4.1 6PLY STRL (MISCELLANEOUS) ×2 IMPLANT
CANISTER SUCT 1200ML W/VALVE (MISCELLANEOUS) IMPLANT
CNTNR SPEC 2.5X3XGRAD LEK (MISCELLANEOUS) ×1
CONT SPEC 4OZ STER OR WHT (MISCELLANEOUS) ×1
CONTAINER SPEC 2.5X3XGRAD LEK (MISCELLANEOUS) ×1 IMPLANT
COVER WAND RF STERILE (DRAPES) ×2 IMPLANT
CUFF TOURN SGL QUICK 12 (TOURNIQUET CUFF) ×2 IMPLANT
CUFF TOURN SGL QUICK 18X4 (TOURNIQUET CUFF) IMPLANT
DRAPE FLUOR MINI C-ARM 54X84 (DRAPES) IMPLANT
DRAPE XRAY CASSETTE 23X24 (DRAPES) IMPLANT
DURAPREP 26ML APPLICATOR (WOUND CARE) ×2 IMPLANT
ELECT REM PT RETURN 9FT ADLT (ELECTROSURGICAL) ×2
ELECTRODE REM PT RTRN 9FT ADLT (ELECTROSURGICAL) ×1 IMPLANT
GAUZE PACKING IODOFORM 1/2 (PACKING) IMPLANT
GAUZE SPONGE 4X4 12PLY STRL (GAUZE/BANDAGES/DRESSINGS) ×2 IMPLANT
GAUZE XEROFORM 1X8 LF (GAUZE/BANDAGES/DRESSINGS) ×2 IMPLANT
GLOVE SURG ENC MOIS LTX SZ7.5 (GLOVE) ×2 IMPLANT
GLOVE SURG UNDER LTX SZ8 (GLOVE) ×2 IMPLANT
GOWN STRL REUS W/ TWL XL LVL3 (GOWN DISPOSABLE) ×2 IMPLANT
GOWN STRL REUS W/TWL XL LVL3 (GOWN DISPOSABLE) ×2
IV NS IRRIG 3000ML ARTHROMATIC (IV SOLUTION) IMPLANT
KIT STIMULAN RAPID CURE 5CC (Orthopedic Implant) ×2 IMPLANT
KIT TURNOVER KIT A (KITS) ×2 IMPLANT
LABEL OR SOLS (LABEL) ×2 IMPLANT
MANIFOLD NEPTUNE II (INSTRUMENTS) ×2 IMPLANT
NEEDLE FILTER BLUNT 18X 1/2SAF (NEEDLE) ×1
NEEDLE FILTER BLUNT 18X1 1/2 (NEEDLE) ×1 IMPLANT
NEEDLE HYPO 25X1 1.5 SAFETY (NEEDLE) ×2 IMPLANT
NS IRRIG 500ML POUR BTL (IV SOLUTION) ×2 IMPLANT
PACK EXTREMITY ARMC (MISCELLANEOUS) ×2 IMPLANT
PAD ABD DERMACEA PRESS 5X9 (GAUZE/BANDAGES/DRESSINGS) ×2 IMPLANT
PULSAVAC PLUS IRRIG FAN TIP (DISPOSABLE)
SHIELD FULL FACE ANTIFOG 7M (MISCELLANEOUS) ×2 IMPLANT
STOCKINETTE M/LG 89821 (MISCELLANEOUS) ×2 IMPLANT
STRAP SAFETY 5IN WIDE (MISCELLANEOUS) IMPLANT
SUT ETHILON 3-0 FS-10 30 BLK (SUTURE) ×2
SUT ETHILON 5-0 FS-2 18 BLK (SUTURE) ×2 IMPLANT
SUT VIC AB 4-0 FS2 27 (SUTURE) ×2 IMPLANT
SUTURE EHLN 3-0 FS-10 30 BLK (SUTURE) ×1 IMPLANT
SYR 10ML LL (SYRINGE) ×6 IMPLANT
TIP FAN IRRIG PULSAVAC PLUS (DISPOSABLE) IMPLANT

## 2021-05-28 NOTE — Transfer of Care (Signed)
Immediate Anesthesia Transfer of Care Note  Patient: Cheng Dec  Procedure(s) Performed: AMPUTATION RAY-FIRST RAY (Right: Foot)  Patient Location: PACU  Anesthesia Type:General  Level of Consciousness: awake and alert   Airway & Oxygen Therapy: Patient connected to nasal cannula oxygen  Post-op Assessment: Report given to RN and Post -op Vital signs reviewed and stable  Post vital signs: Reviewed and stable  Last Vitals:  Vitals Value Taken Time  BP 152/67 05/28/21 1430  Temp 36.3 C 05/28/21 1404  Pulse 67 05/28/21 1443  Resp 17 05/28/21 1443  SpO2 99 % 05/28/21 1443  Vitals shown include unvalidated device data.  Last Pain:  Vitals:   05/28/21 1404  TempSrc:   PainSc: 0-No pain         Complications: No notable events documented.

## 2021-05-28 NOTE — Anesthesia Preprocedure Evaluation (Signed)
Anesthesia Evaluation  Patient identified by MRN, date of birth, ID band Patient awake    Reviewed: Allergy & Precautions, NPO status , Patient's Chart, lab work & pertinent test results  History of Anesthesia Complications Negative for: history of anesthetic complications  Airway       Comment: Unable to assess  Dental  (+) Poor Dentition   Pulmonary neg sleep apnea, neg COPD, former smoker,    breath sounds clear to auscultation- rhonchi (-) wheezing      Cardiovascular + CAD, + Past MI and + Cardiac Stents  (-) CABG  Rhythm:Regular Rate:Normal - Systolic murmurs and - Diastolic murmurs    Neuro/Psych neg Seizures Dementia negative neurological ROS     GI/Hepatic Neg liver ROS, PUD, GERD  ,  Endo/Other  diabetes, Oral Hypoglycemic Agents  Renal/GU negative Renal ROS     Musculoskeletal  (+) Arthritis ,   Abdominal (+) - obese,   Peds  Hematology  (+) anemia ,   Anesthesia Other Findings Past Medical History: No date: Anginal pain (HCC) No date: Arthritis No date: Complication of anesthesia     Comment:  spinal anesthesia caused nerve damage in 2002 No date: Coronary artery disease No date: Diabetes mellitus without complication (HCC)     Comment:  md stopped metformin No date: Diabetic ulcer of left foot (HCC) No date: GERD (gastroesophageal reflux disease) No date: Hyperlipemia No date: Multiple thyroid nodules No date: Myocardial infarction (HCC)     Comment:  STEMI No date: Osteoporosis No date: Peripheral nerve disease No date: Ulceration of colon     Comment:  NSAIDS No date: Vitamin D deficiency   Reproductive/Obstetrics                             Anesthesia Physical Anesthesia Plan  ASA: 3  Anesthesia Plan: General   Post-op Pain Management:    Induction: Intravenous  PONV Risk Score and Plan: 1 and Propofol infusion  Airway Management Planned: Natural  Airway  Additional Equipment:   Intra-op Plan:   Post-operative Plan:   Informed Consent: I have reviewed the patients History and Physical, chart, labs and discussed the procedure including the risks, benefits and alternatives for the proposed anesthesia with the patient or authorized representative who has indicated his/her understanding and acceptance.     Dental advisory given  Plan Discussed with: CRNA and Anesthesiologist  Anesthesia Plan Comments:         Anesthesia Quick Evaluation

## 2021-05-28 NOTE — Progress Notes (Signed)
Initial Nutrition Assessment  DOCUMENTATION CODES:  Not applicable  INTERVENTION:  Advance diet after procedure per SLP recommendations Magic cup TID with meals, each supplement provides 290 kcal and 9 grams of protein Honey Thick Mighty Shake 1x/d, each supplement provides 200kcal and 7g protein   NUTRITION DIAGNOSIS:  Increased nutrient needs related to wound healing as evidenced by estimated needs.  GOAL:  Patient will meet greater than or equal to 90% of their needs  MONITOR:  PO intake, Diet advancement, Labs, Skin  REASON FOR ASSESSMENT:  Diagnosis, LOS (wounds)    ASSESSMENT:  87 y.o. male presented to ER at the advice of his podiatrist for evaluation of worsening right foot wound that has developed worsening erythema, and drainage. Completed a course of antibiotics outpatient without any improvement. PMH includes CAD, DM, GERD, DM foot ulcers, HLD, Hx MI, osteoporosis, vitamin d deficiency and dementia.  Pt with ulcers to the bilateral feet, right worse than left. Evaluation of right foot concerning for osteomyelitis. Podiatry recommended surgical intervention when stable. Planned for 6/24.   Pt noted to have an aspiration event 6/16 and was made NPO. SLP advanced to DYS 1 with honey thick liquids on 6/20.  Pt out of room at the time of visit for surgery. Reviewed chart from this admission. Intake is fair, but pt was NPO x4 days and NPO today for procedure. Limited weight hx available, no measured weight since admission. Will request. Highly likely that pt has malnutrition present.  Diet Order Hx: 6/15: Heart healthy/carb modified 6/16-6/19: NPO 6/20-6/23: DYS 1 / honey thick liquids 6/24: NPO for procedure   Average Meal Intake: 6/15-6/24: 65% intake x 5 recorded meals  Nutritionally Relevant Medications: Scheduled Meds:  vitamin C  250 mg Oral BID   fenofibrate  160 mg Oral Daily   metFORMIN  1,000 mg Oral BID WC   multivitamin with minerals  1 tablet Oral  Daily   pantoprazole  40 mg Oral Daily   phosphorus  500 mg Oral Daily   PRN Meds: diphenhydrAMINE, ondansetron  Labs Reviewed  NUTRITION - FOCUSED PHYSICAL EXAM: Defer to follow-up assessment  Diet Order:   Diet Order             Diet NPO time specified  Diet effective ____                  EDUCATION NEEDS:  No education needs have been identified at this time  Skin:  Skin Assessment: Skin Integrity Issues: Skin Integrity Issues:: Diabetic Ulcer Diabetic Ulcer: bilateral feet  Last BM:  6/23 - type 6  Height:  Ht Readings from Last 1 Encounters:  05/28/21 5\' 9"  (1.753 m)   Weight:  Wt Readings from Last 1 Encounters:  05/28/21 59 kg   Ideal Body Weight:  72.7 kg  BMI:  Body mass index is 19.21 kg/m.  Estimated Nutritional Needs: Kcal:  1600-1800 kcal/d Protein:  90-100 g/d Fluid:  >1800 mL/d   05/30/21, RD, LDN Clinical Dietitian Pager on Amion

## 2021-05-28 NOTE — Progress Notes (Signed)
OT Cancellation Note  Patient Details Name: Rodney Kelly MRN: [id number] DOB: [birth year]   Cancelled Treatment:    Reason Eval/Treat Not Completed: Patient at procedure or test/ unavailable. Chart reviewed - pt noted to be off the unit for planned R foot partial first ray amputation. Will sign off and await new orders to re-evaluate after surgery.  Kathie Dike, M.S. OTR/L  05/28/21, 12:01 PM  ascom [phone]

## 2021-05-28 NOTE — Progress Notes (Signed)
SLP Cancellation Note  Patient Details Name: Granger Chui MRN: [id number] DOB: [birth year]   Cancelled treatment:       Reason Eval/Treat Not Completed: Patient at procedure or test/unavailable (chart reviewed) Pt is currently NPO for surgery(foot) today. Will f/u tomorrow when pt returns to his oral(dysphagia) diet. Per report, pt has been taking bites/sips in the last 2 days w/out overt s/s of aspiration or decline in status noted. ST will f/u tomorrow w/ trials to upgrade liquid consistency hopefully.       Jerilynn Som, MS, CCC-SLP Speech Language Pathologist Rehab Services [phone] [facility] 05/28/2021, 8:04 AM

## 2021-05-28 NOTE — [place] FL2 (Signed)
[place] MEDICAID FL2 LEVEL OF CARE SCREENING TOOL     IDENTIFICATION  Patient Name: Rodney Kelly Birthdate: 08/25/1924 Sex: male Admission Date (Current Location): 05/19/2021  [facility] and [place] Number:  Chiropodist and Address:  [facility], [address], [place], [place] [address]      Provider Number: [id number]  Attending Physician Name and Address:  Kathrynn Running, MD  Relative Name and Phone Number:  RYUN, VELEZ (Son)   [phone] [facility])    Current Level of Care: Hospital Recommended Level of Care: Skilled Nursing Facility Prior Approval Number:    Date Approved/Denied:   PASRR Number: PASRR Pending  Discharge Plan: SNF    Current Diagnoses: Patient Active Problem List   Diagnosis Date Noted   Diabetic foot infection (HCC) 05/19/2021   Coronary artery disease    Type 2 diabetes mellitus with complication, without long-term current use of insulin (HCC)    Diabetic foot ulcers (HCC)    Hyperlipemia    GERD (gastroesophageal reflux disease)    Normocytic anemia    Cellulitis of right lower extremity     Orientation RESPIRATION BLADDER Height & Weight     Self  Normal External catheter Weight: 59 kg Height:  5\' 9"  (175.3 cm)  BEHAVIORAL SYMPTOMS/MOOD NEUROLOGICAL BOWEL NUTRITION STATUS        Diet  AMBULATORY STATUS COMMUNICATION OF NEEDS Skin   Extensive Assist Verbally (sometimes mumbles, but is verbal)                         Personal Care Assistance Level of Assistance  Bathing, Feeding, Dressing Bathing Assistance: Maximum assistance Feeding assistance: Maximum assistance Dressing Assistance: Maximum assistance     Functional Limitations Info  Sight, Hearing, Speech Sight Info: Impaired Hearing Info: Impaired Speech Info: Impaired    SPECIAL CARE FACTORS FREQUENCY  PT (By licensed PT), OT (By licensed OT)     PT Frequency: 5x weekly OT Frequency: 5x weekly             Contractures Contractures Info: Not present    Additional Factors Info  Code Status, Allergies Code Status Info: FULL CODE Allergies Info: Penicillins, Sulfa Antibiotics, Cephalosporins           Current Medications (05/28/2021):  This is the current hospital active medication list Current Facility-Administered Medications  Medication Dose Route Frequency Provider Last Rate Last Admin   [MAR Hold] albuterol (PROVENTIL) (2.5 MG/3ML) 0.083% nebulizer solution 3 mL  3 mL Inhalation Q4H PRN 05/30/2021, MD   3 mL at 05/21/21 0458   [MAR Hold] ascorbic acid (VITAMIN C) tablet 250 mg  250 mg Oral BID Elgergawy, 05/23/21, MD   250 mg at 05/27/21 2219   [facility] Hold] aspirin chewable tablet 81 mg  81 mg Oral Daily [facility], MD   81 mg at 05/27/21 0910   [MAR Hold] Chlorhexidine Gluconate Cloth 2 % PADS 6 each  6 each Topical Daily Elgergawy, 05/29/21, MD   6 each at 05/28/21 0953   [MAR Hold] diphenhydrAMINE (BENADRYL) injection 12.5 mg  12.5 mg Intravenous Q6H PRN 05/30/21, NP   12.5 mg at 05/27/21 0129   [MAR Hold] enoxaparin (LOVENOX) injection 40 mg  40 mg Subcutaneous Q24H Elgergawy, 05/29/21, MD   40 mg at 05/27/21 2218   [MAR Hold] fenofibrate tablet 160 mg  160 mg Oral Daily 2219, MD   160 mg at 05/27/21 05/29/21   [  MAR Hold] HYDROcodone-acetaminophen (NORCO/VICODIN) 5-325 MG per tablet 1 tablet  1 tablet Oral Q6H PRN Lorretta Harp, MD       [MAR Hold] metFORMIN (GLUCOPHAGE) tablet 1,000 mg  1,000 mg Oral BID WC Wouk, Wilfred Curtis, MD   1,000 mg at 05/27/21 1637   [MAR Hold] multivitamin with minerals tablet 1 tablet  1 tablet Oral Daily Elgergawy, Leana Roe, MD   1 tablet at 05/27/21 0910   [MAR Hold] mupirocin ointment (BACTROBAN) 2 %   Topical Daily Doroteo Glassman, RPH   Given at 05/28/21 0954   [MAR Hold] nitroGLYCERIN (NITROSTAT) SL tablet 0.4 mg  0.4 mg Sublingual Q5 min PRN Lorretta Harp, MD       [facility] Hold] ondansetron [facility]) injection 4 mg  4 mg Intravenous Q8H PRN Lorretta Harp, MD       [MAR Hold] pantoprazole (PROTONIX) EC tablet 40 mg  40 mg Oral Daily Kathrynn Running, MD   40 mg at 05/27/21 0911   [facility] Hold] phosphorus (K PHOS NEUTRAL) tablet 500 mg  500 mg Oral Daily Elgergawy, Leana Roe, MD   500 mg at 05/27/21 0911   [facility] Hold] sodium chloride flush (NS) 0.9 % injection 10 mL  10 mL Intravenous Q12H Lorretta Harp, MD   10 mL at 05/28/21 0955   [MAR Hold] tamsulosin (FLOMAX) capsule 0.4 mg  0.4 mg Oral QPC supper Elgergawy, Leana Roe, MD   0.4 mg at 05/27/21 1640   [START ON 05/29/2021] vancomycin (VANCOCIN) IVPB 1000 mg/200 mL premix  1,000 mg Intravenous Q24H Foye Deer, RPH       vancomycin (VANCOREADY) IVPB 1250 mg/250 mL  1,250 mg Intravenous Once Foye Deer, RPH       Facility-Administered Medications Ordered in Other Encounters  Medication Dose Route Frequency Provider Last Rate Last Admin   0.9 %  sodium chloride infusion   Intravenous Continuous PRN Henrietta Hoover, CRNA   New Bag at 05/28/21 1324   fentaNYL (SUBLIMAZE) injection   Intravenous Anesthesia Intra-op Henrietta Hoover, CRNA   25 mcg at 05/28/21 1320   lidocaine (cardiac) 100 mg/67mL (XYLOCAINE) injection 2%   Intravenous Anesthesia Intra-op Henrietta Hoover, CRNA   60 mg at 05/28/21 1324   propofol (DIPRIVAN) 500 MG/50ML infusion   Intravenous Continuous PRN Henrietta Hoover, CRNA 17.7 mL/hr at 05/28/21 1326 50 mcg/kg/min at 05/28/21 1326     Discharge Medications: Please see discharge summary for a list of discharge medications.  Relevant Imaging Results:  Relevant Lab Results:   Additional Information SSN [id number]  Caryn Section, RN

## 2021-05-28 NOTE — TOC Progression Note (Signed)
Transition of Care [facility]) - Progression Note    Patient Details  Name: Rodney Kelly MRN: [id number] Date of Birth: 04-25-1924  [facility] CM/SW Contact  Caryn Section, RN Phone Number: 05/28/2021, 1:41 PM  Clinical Narrative:   Sherron Monday to son, Onalee Hua and he would like to begin bed search for patient.  Family prefers a SNF in [place].  PASRR pending, awaiting current PT note, FL2 and PASRR application complete.    Expected Discharge Plan: Home w Home Health Services Barriers to Discharge: Continued Medical Work up  Expected Discharge Plan and Services Expected Discharge Plan: Home w Home Health Services   Discharge Planning Services: CM Consult Post Acute Care Choice: Home Health                                         Social Determinants of Health (SDOH) Interventions    Readmission Risk Interventions No flowsheet data found.

## 2021-05-28 NOTE — Plan of Care (Signed)
  Problem: Education: Goal: Knowledge of General Education information will improve Description Including pain rating scale, medication(s)/side effects and non-pharmacologic comfort measures Outcome: Progressing   

## 2021-05-28 NOTE — Progress Notes (Signed)
PROGRESS NOTE    Rodney Kelly  ZOX:[id number] DOB: 1924-06-29 DOA: 05/19/2021 PCP: Wilkie AyeShaner, Brian Allen, MD    Chief Complaint  Patient presents with   Wound Infection    Brief Narrative:   87 y.o. male with medical history significant of DM, HLD, PCD, GERD, CAD, MI, diabetic foot ulcer, dementia who presents with foot ulcer with infection, work-up significant for osteomyelitis, patient was seen by vascular surgery, no further work-up from their side, he has been followed by podiatry.  MRI significant for osteomyelitis, patient with severe hospital delirium, on 6/17 early a.m., patient with hypoxia, new oxygen requirement and chest x-ray showing aspiration in right lung base.  Hospital course complicated by severe delirium, aspiration pneumonia with dysphagia.  Assessment & Plan:   Principal Problem:   Diabetic foot infection (HCC) Active Problems:   Coronary artery disease   Type 2 diabetes mellitus with complication, without long-term current use of insulin (HCC)   Diabetic foot ulcers (HCC)   Hyperlipemia   GERD (gastroesophageal reflux disease)   Normocytic anemia   Cellulitis of right lower extremity   Right foot, cellulitis/myofasciitis with abscess,  osteomyelitis right great toe and first metatarsal bone. - Patient failed outpatient oral antibiotic doxycycline treatment.  -No evidence of DVT on venous Dopplers -discussed w/ podiatry. Amputation initially on hold given acute delirium and aspiration pneumonia. Pt's aspiration pneumonia/itis is resolved and is close to baseline, plan now to proceed w/ amputation today. - abx on hold last couple of days 2/2 rash, id now advises starting vanc, will order that  Rash Macular rash on back present since at least 6/21. likely drug reaction, ID agrees. Does appear to be slowly improving - cefazolin on hold per ID - if rash clears w/ holding cefazolin will add to allergies  Acute metabolic encephalopathy/severe  hospital delirium -Patient with severe hospital delirium, he is having a bedside sitter, as needed Haldol as well, trying to minimize narcotics and benzo diazepam, increased frequency of reorientation, trying to correct day/night cycle. -Continue with bedside sitter -I have discussed with son, encouraged him to have him or family member stay next to patient to have familiar faces. - CT head with no acute findings - appears to be improving  Aspiration pneumonia -Chest x-ray significant for right lung base opacity, on antibiotic coverage, seen closely by SLP, currently advanced to dysphagia 1 with nectar thick. S/p 5 days flagyl  Sinus tachycardia -Discussed EKG finding on 6/19 with Dr. Juliann Paresallwood  who eviewed the EKG, doubt it is A. fib, most likely sinus tachycardia with PACs versus SVT, - hold iv metoprolol for nlw  Hypokalemia Resolved - monitor   Coronary artery disease:  -No CP. S/p of stent per his son -ASA, tricor, Imdur -prn NGT  Elevated D-dimers -Lower extremity Dopplers negative for DVT, continue with Lovenox 40 mg subcu daily.  Urinary retention -Foley catheter inserted 6/17, now removed, started on Flomax.     Type 2 diabetes mellitus with complication of foot ulcer, without long-term current use of insulin (HCC): Recent A1c 6.4, well controlled.  Patient taking metformin at home - resumed home metformin - monitor daily fasting sugar   Hyperlipemia -Fenofibrate -Patient is not taking Lipitor currently   GERD (gastroesophageal reflux disease) -Protonix   Normocytic anemia:  -Plan, this is most likely anemia of chronic disease    [facility] provider have called [facility] to requested transfer on 6/19, have discussed with internist on-call Dr. Seth BakeWahid Lanna,  given there is no indication for transfer there, and all services available at our facility, they have declined transfer especially they are at capacity, I have updated  the son at  DVT prophylaxis: lovenox Code Status: Full Family Communication: son updated @ bedside 6/22 and by podiatry on 6/23 Disposition:   Status is: Inpatient  Remains inpatient appropriate because:IV treatments appropriate due to intensity of illness or inability to take PO  Dispo: The patient is from: Home              Anticipated d/c is to: Home              Patient currently is not medically stable to d/c.   Difficult to place patient No       Consultants:  Podiatry Vascular surgery ID  Subjective:  No significant events overnight. Breathing comfortably, tolerating diet.   Objective: Vitals:   05/27/21 1544 05/27/21 2041 05/28/21 0633 05/28/21 0802  BP: (!) 160/73 138/67 124/63 (!) 149/81  Pulse: 76 93 81 75  Resp: 20 18 16 19   Temp: (!) 97.5 F (36.4 C) 98.2 F (36.8 C) 98 F (36.7 C) (!) 97.4 F (36.3 C)  TempSrc:  Oral    SpO2: 100% 98% 91% 98%  Weight:      Height:        Intake/Output Summary (Last 24 hours) at 05/28/2021 1041 Last data filed at 05/27/2021 1700 Gross per 24 hour  Intake --  Output 200 ml  Net -200 ml    Filed Weights   05/19/21 0722  Weight: 59 kg    Examination:  Frail elderly patient, sitting in recliner, with fluctuating mental status, confused, frail  Symmetrical Chest wall movement, rales at bases RRR,No Gallops,Rubs or new Murmurs,  +ve B.Sounds, Abd Soft, No tenderness, No rebound - guarding or rigidity. No Cyanosis, bilateral feet with Ace wrap. Macular rash on back improving   Data Reviewed: I have personally reviewed following labs and imaging studies  CBC: Recent Labs  Lab 05/24/21 0422 05/25/21 0535 05/26/21 0423 05/26/21 1132 05/27/21 0602  WBC 7.2 7.2 6.1 7.2 7.4  NEUTROABS  --   --   --  5.1  --   HGB 10.5* 10.7* 9.9* 10.1* 10.9*  HCT 32.0* 32.9* 31.0* 31.8* 33.7*  MCV 86.3 86.8 87.1 89.1 88.2  PLT 376 343 346 349 358    Basic Metabolic Panel: Recent Labs  Lab 05/22/21 2336  05/23/21 0514 05/24/21 0422 05/25/21 0535 05/26/21 0423 05/27/21 0602 05/28/21 0457  NA  --  139 139 139 139 143  --   K  --  3.5 3.5 3.3* 3.3* 3.7  --   CL  --  105 104 104 104 103  --   CO2  --  23 24 27 30  32  --   GLUCOSE  --  123* 108* 133* 130* 134* 132*  BUN  --  13 14 16 13 12   --   CREATININE  --  0.71 0.76 0.58* 0.62 0.66  --   CALCIUM  --  9.2 8.8* 9.1 8.8* 9.3  --   MG 1.7  --   --   --  1.8 2.0 1.9  PHOS  --  2.5  --  2.6 2.5  --   --     GFR: Estimated Creatinine Clearance: 47.1 mL/min (by C-G formula based on SCr of 0.66 mg/dL).  Liver Function Tests: No results for input(s): AST, ALT, ALKPHOS, BILITOT, PROT, ALBUMIN in the last  168 hours.   CBG: Recent Labs  Lab 05/27/21 0805 05/27/21 1131 05/27/21 1526 05/27/21 2044 05/28/21 0808  GLUCAP 140* 148* 204* 173* 143*     Recent Results (from the past 240 hour(s))  Blood Cultures x 2 sites     Status: None   Collection Time: 05/19/21  9:35 AM   Specimen: BLOOD  Result Value Ref Range Status   Specimen Description BLOOD LEFT AC  Final   Special Requests   Final    BOTTLES DRAWN AEROBIC AND ANAEROBIC Blood Culture results may not be optimal due to an excessive volume of blood received in culture bottles   Culture   Final    NO GROWTH 5 DAYS Performed at [facility], [address] [place], [place] [address]    Report Status 05/24/2021 FINAL  Final  Blood Cultures x 2 sites     Status: None   Collection Time: 05/19/21  9:35 AM   Specimen: BLOOD  Result Value Ref Range Status   Specimen Description BLOOD  RIGHT [facility]  Final   Special Requests   Final    BOTTLES DRAWN AEROBIC AND ANAEROBIC Blood Culture adequate volume   Culture   Final    NO GROWTH 5 DAYS Performed at [facility], [address] [place], [place] [address]    Report Status 05/24/2021 FINAL  Final  SARS CORONAVIRUS 2 (TAT 6-24 HRS) Nasopharyngeal Nasopharyngeal Swab     Status: None   Collection Time: 05/19/21   9:35 AM   Specimen: Nasopharyngeal Swab  Result Value Ref Range Status   SARS Coronavirus 2 NEGATIVE NEGATIVE Final    Comment: (NOTE) SARS-CoV-2 target nucleic acids are NOT DETECTED.  The SARS-CoV-2 RNA is generally detectable in upper and lower respiratory specimens during the acute phase of infection. Negative results do not preclude SARS-CoV-2 infection, do not rule out co-infections with other pathogens, and should not be used as the sole basis for treatment or other patient management decisions. Negative results must be combined with clinical observations, patient history, and epidemiological information. The expected result is Negative.  Fact Sheet for Patients: HairSlick.no  Fact Sheet for Healthcare Providers: quierodirigir.com  This test is not yet approved or cleared by the [place] FDA and  has been authorized for detection and/or diagnosis of SARS-CoV-2 by FDA under an Emergency Use Authorization (EUA). This EUA will remain  in effect (meaning this test can be used) for the duration of the COVID-19 declaration under Se ction 564(b)(1) of the Act, 21 U.S.C. section 360bbb-3(b)(1), unless the authorization is terminated or revoked sooner.  Performed at [facility], [address], [place], [place] [address]   Aerobic/Anaerobic Culture w Gram Stain (surgical/deep wound)     Status: None   Collection Time: 05/19/21 12:36 PM   Specimen: Wound  Result Value Ref Range Status   Specimen Description   Final    WOUND Performed at [facility], [address], [place], [place] [address]    Special Requests   Final    NONE Performed at [facility], [address] [place], [place] [address]    Gram Stain   Final    RARE WBC PRESENT, PREDOMINANTLY MONONUCLEAR RARE GRAM POSITIVE COCCI    Culture   Final    RARE STAPHYLOCOCCUS EPIDERMIDIS NO ANAEROBES ISOLATED Performed at [facility], [address] [place], [place] [address]    Report Status 05/24/2021 FINAL  Final   Organism  ID, Bacteria STAPHYLOCOCCUS EPIDERMIDIS  Final      Susceptibility   Staphylococcus epidermidis - MIC*    CIPROFLOXACIN <=0.5 SENSITIVE Sensitive     ERYTHROMYCIN >=8 RESISTANT Resistant     GENTAMICIN <=0.5 SENSITIVE Sensitive     OXACILLIN <=0.25 SENSITIVE Sensitive     TETRACYCLINE >=16 RESISTANT Resistant     VANCOMYCIN 1 SENSITIVE Sensitive     TRIMETH/SULFA <=10 SENSITIVE Sensitive     CLINDAMYCIN <=0.25 SENSITIVE Sensitive     RIFAMPIN <=0.5 SENSITIVE Sensitive     Inducible Clindamycin NEGATIVE Sensitive     * RARE STAPHYLOCOCCUS EPIDERMIDIS  MRSA PCR Screening     Status: None   Collection Time: 05/20/21  3:50 PM  Result Value Ref Range Status   MRSA by PCR NEGATIVE NEGATIVE Final    Comment:        The GeneXpert MRSA Assay (FDA approved for NASAL specimens only), is one component of a comprehensive MRSA colonization surveillance program. It is not intended to diagnose MRSA infection nor to guide or monitor treatment for MRSA infections. Performed at [facility], [address], [place], [place] [address]          Radiology Studies: No results found.      Scheduled Meds:  vitamin C  250 mg Oral BID   aspirin  81 mg Oral Daily   Chlorhexidine Gluconate Cloth  6 each Topical Daily   enoxaparin (LOVENOX) injection  40 mg Subcutaneous Q24H   fenofibrate  160 mg Oral Daily   metFORMIN  1,000 mg Oral BID WC   multivitamin with minerals  1 tablet Oral Daily   mupirocin ointment   Topical Daily   pantoprazole  40 mg Oral Daily   phosphorus  500 mg Oral Daily   sodium chloride flush  10 mL Intravenous Q12H   tamsulosin  0.4 mg Oral QPC supper   Continuous Infusions:     LOS: 9 days       Silvano Bilis, MD [facility]   To contact the attending provider between 7A-7P or the covering provider during after hours 7P-7A,  please log into the web site [email or web address] and access using universal [place] password for that web site. If you do not have the password, please call the hospital operator.  05/28/2021, 10:41 AM

## 2021-05-28 NOTE — Anesthesia Procedure Notes (Signed)
Date/Time: 05/28/2021 1:20 PM Performed by: Henrietta Hoover, CRNA Pre-anesthesia Checklist: Patient identified, Emergency Drugs available, Suction available, Patient being monitored and Timeout performed Patient Re-evaluated:Patient Re-evaluated prior to induction Oxygen Delivery Method: Nasal cannula Placement Confirmation: positive ETCO2

## 2021-05-28 NOTE — Anesthesia Postprocedure Evaluation (Signed)
Anesthesia Post Note  Patient: Rodney Kelly  Procedure(s) Performed: AMPUTATION RAY-FIRST RAY (Right: Foot)  Patient location during evaluation: PACU Anesthesia Type: General Level of consciousness: awake and alert Pain management: pain level controlled Vital Signs Assessment: post-procedure vital signs reviewed and stable Respiratory status: spontaneous breathing, nonlabored ventilation and respiratory function stable Cardiovascular status: blood pressure returned to baseline and stable Postop Assessment: no signs of nausea or vomiting Anesthetic complications: no   No notable events documented.   Last Vitals:  Vitals:   05/28/21 1415 05/28/21 1430  BP: (!) 155/65 (!) 152/67  Pulse: 79 68  Resp: (!) 21 15  Temp:    SpO2: 100% 100%    Last Pain:  Vitals:   05/28/21 1404  TempSrc:   PainSc: 0-No pain                 Shaneisha Burkel

## 2021-05-28 NOTE — Progress Notes (Signed)
   Date of Admission:  05/19/2021       Subjective: Underwent partial first ray amputation rt foot Today Resting Says he is okay  Medications:   vitamin C  250 mg Oral BID   aspirin  81 mg Oral Daily   Chlorhexidine Gluconate Cloth  6 each Topical Daily   enoxaparin (LOVENOX) injection  40 mg Subcutaneous Q24H   fenofibrate  160 mg Oral Daily   metFORMIN  1,000 mg Oral BID WC   multivitamin with minerals  1 tablet Oral Daily   mupirocin ointment   Topical Daily   pantoprazole  40 mg Oral Daily   phosphorus  500 mg Oral Daily   sodium chloride flush  10 mL Intravenous Q12H   tamsulosin  0.4 mg Oral QPC supper    Objective: Vital signs in last 24 hours: Temp:  [97.4 F (36.3 C)-98.2 F (36.8 C)] 97.5 F (36.4 C) (06/24 1458) Pulse Rate:  [67-93] 68 (06/24 1458) Resp:  [15-21] 18 (06/24 1458) BP: (124-155)/(59-81) 150/59 (06/24 1458) SpO2:  [91 %-100 %] 100 % (06/24 1458) Weight:  [59 kg] 59 kg (06/24 1223)  PHYSICAL EXAM:  General: resting Lungs: Clear to auscultation bilaterally. No Wheezing or Rhonchi. No rales. Heart: Regular rate and rhythm, no murmur, rub or gallop. Abdomen: Soft, non-tender,not distended. Bowel sounds normal. No masses Extremities: rt foot surgical dressing Skin: No rashes or lesions. Or bruising Lymph: Cervical, supraclavicular normal. Neurologic: Grossly non-focal  Lab Results Recent Labs    05/26/21 0423 05/26/21 1132 05/27/21 0602  WBC 6.1 7.2 7.4  HGB 9.9* 10.1* 10.9*  HCT 31.0* 31.8* 33.7*  NA 139  --  143  K 3.3*  --  3.7  CL 104  --  103  CO2 30  --  32  BUN 13  --  12  CREATININE 0.62  --  0.66      ESR 117 on 05/19/21 CRP 12.5 on6/15/22 Microbiology: 05/19/21- staph epi in wound is a skin bacteria and not the reason for his wound Studies/Results: MRI foot Small open wound on the plantar aspect of the medial forefoot at the level of the first MTP joint. Associated underlying soft tissue abscess. 2. Associated  osteomyelitis involving the medial sesamoid of the great toe and also the proximal phalanx of the great toe. 3. Suspect osteomyelitis involving the first metatarsal head along its plantar aspect.  Assessment/Plan: Rt foot wound on the first MTP area with underlying osteomyelitis- chronic Underwent first ray amputation today- cultures sent- started on vanco today- observe closely renal function- low threshold for stopping vanco Depending on culture /biopsy will decide on oral antibiotics  Rash due to cephalosporin  improved  Encephalopathy resolved  Aspirati pneumonitis resolved  ID will follow him peripherally this weekend. Call if needed

## 2021-05-28 NOTE — Progress Notes (Signed)
Pharmacy Antibiotic Note  Rodney Kelly is a 87 y.o. male admitted on 05/19/2021 with  diabetic foot ulcer .  MRI shows osteomyelitis.Pharmacy has been consulted for Vancomycin dosing. Patient was previously on Vancomycin and Cefepime then transitioned to Cefazolin but patient developed a rash thought to be due to Cefazolin. Cefazolin was discontinued on 6/22. Patient is planned for amputation today and will restart Vancomycin.  Plan: Vancomycin 1250 mg IV loading dose Vancomycin 1000 mg IV Q 24 hrs.  Goal AUC 400-550. Expected AUC: 541.1 SCr used: 0.8 (actual 0.66) Expected Cmin: 13.4   Height: 5\' 9"  (175.3 cm) Weight: 59 kg (130 lb 1.1 oz) IBW/kg (Calculated) : 70.7  Temp (24hrs), Avg:97.7 F (36.5 C), Min:97.4 F (36.3 C), Max:98.2 F (36.8 C)  Recent Labs  Lab 05/23/21 0514 05/24/21 0422 05/25/21 0535 05/26/21 0423 05/26/21 1132 05/27/21 0602  WBC 9.1 7.2 7.2 6.1 7.2 7.4  CREATININE 0.71 0.76 0.58* 0.62  --  0.66    Estimated Creatinine Clearance: 47.1 mL/min (by C-G formula based on SCr of 0.66 mg/dL).    Allergies  Allergen Reactions   Penicillins Other (See Comments)    unknown   Sulfa Antibiotics Other (See Comments)    unknown   Cephalosporins Rash    Developed maculopapular rash following treatment with cefepime to cefazolin June 2022    Antimicrobials this admission: Aztreonam 6/15 x 1 Vancomycin 6/15 >> 6/17, 6/24 >> Cefepime 6/16 >> 6/20 Cefazolin 6/20 >> 6/22 Metronidazole 6/17 >> 6/21  Dose adjustments this admission:    Microbiology results: 6/15 BCx: No growth 6/15 WCx: MSSE  6/16 MRSA PCR: negative  Thank you for allowing pharmacy to be a part of this patient's care.  7/16, PharmD, BCPS 05/28/2021 1:10 PM

## 2021-05-28 NOTE — Op Note (Signed)
Operative note   Surgeon:Sayeed Weatherall Armed forces logistics/support/administrative officer: None    Preop diagnosis: Osteomyelitis right first metatarsal    Postop diagnosis: Same    Procedure: 1.  Partial first ray amputation right foot 2.  Implantation of vancomycin calcium sulfate beads    EBL: Minimal    Anesthesia:local and IV sedation    Hemostasis: Ankle tourniquet inflated to 200 mmHg for approximately 10 minutes    Specimen: First metatarsal for pathology and bone for culture    Complications: None    Operative indications:Rodney Kelly is an 87 y.o. that presents today for surgical intervention.  The risks/benefits/alternatives/complications have been discussed and consent has been given.    Procedure:  Patient was brought into the OR and placed on the operating table in thesupine position. After anesthesia was obtained the right lower extremity was prepped and draped in usual sterile fashion.  Attention was directed to the right foot where a full-thickness teardrop incision was made circumferential around the base of the first metatarsal.  Full-thickness flaps were created down to the level of bone.  About the level of the midshaft of the first metatarsal an osteotomy was created.  The toe and metatarsal were then removed from the surgical field in toto.  At the ulcerative site this portion of the tissue was inked.  A small amount of bone of the sesamoid was removed and sent for culture.  The remaining tissue was sent for pathological examination.  The tourniquet was then dropped.  Minimal bleeding was noted.  The wound was flushed with copious amounts of irrigation.  Closure was then performed with a 2-0 nylon for the skin.  A large bulky dressing was applied    Patient tolerated the procedure and anesthesia well.  Was transported from the OR to the PACU with all vital signs stable and vascular status intact. To be discharged per routine protocol.  Will follow up in approximately 1 week in the  outpatient clinic.

## 2021-05-29 LAB — CBC
HCT: 34 % — ABNORMAL LOW (ref 39.0–52.0)
Hemoglobin: 10.9 g/dL — ABNORMAL LOW (ref 13.0–17.0)
MCH: 28.3 pg (ref 26.0–34.0)
MCHC: 32.1 g/dL (ref 30.0–36.0)
MCV: 88.3 fL (ref 80.0–100.0)
Platelets: 329 10*3/uL (ref 150–400)
RBC: 3.85 MIL/uL — ABNORMAL LOW (ref 4.22–5.81)
RDW: 16.2 % — ABNORMAL HIGH (ref 11.5–15.5)
WBC: 10.1 10*3/uL (ref 4.0–10.5)
nRBC: 0 % (ref 0.0–0.2)

## 2021-05-29 LAB — GLUCOSE, CAPILLARY
Glucose-Capillary: 120 mg/dL — ABNORMAL HIGH (ref 70–99)
Glucose-Capillary: 177 mg/dL — ABNORMAL HIGH (ref 70–99)

## 2021-05-29 LAB — BASIC METABOLIC PANEL
Anion gap: 7 (ref 5–15)
BUN: 13 mg/dL (ref 8–23)
CO2: 31 mmol/L (ref 22–32)
Calcium: 9.4 mg/dL (ref 8.9–10.3)
Chloride: 101 mmol/L (ref 98–111)
Creatinine, Ser: 0.54 mg/dL — ABNORMAL LOW (ref 0.61–1.24)
GFR, Estimated: >60 mL/min (ref >60)
Glucose, Bld: 135 mg/dL — ABNORMAL HIGH (ref 70–99)
Potassium: 3.8 mmol/L (ref 3.5–5.1)
Sodium: 139 mmol/L (ref 135–145)

## 2021-05-29 MED ORDER — ACETAMINOPHEN 325 MG PO TABS
650.0000 mg | ORAL_TABLET | Freq: Four times a day (QID) | ORAL | Status: DC | PRN
  Administered 2021-05-29 – 2021-06-07 (×10): 650 mg via ORAL
  Filled 2021-05-29 (×10): qty 2

## 2021-05-29 NOTE — Evaluation (Addendum)
Physical Therapy Re-Evaluation Patient Details Name: Rodney Kelly MRN: [id number] DOB: [birth year] Today's Date: 05/29/2021   History of Present Illness  Per MD notes: Pt is a 87 y.o. male with medical history significant of DM, HLD, PCD, GERD, CAD, MI, diabetic foot ulcer, dementia who presents with foot ulcer with infection, work-up significant for osteomyelitis, patient was seen by vascular surgery, no further work-up from their side, he has been followed by podiatry.  MRI significant for osteomyelitis, patient with severe hospital delirium, on 6/17 early a.m., patient with hypoxia, new oxygen requirement and chest x-ray showing aspiration in right lung base.  Hospital course complicated by severe delirium causing severe dysphagia, where he is strictly n.p.o. even for medications.  MD assessment summary includes: Right foot cellulitis/myofasciitis with abscess, osteomyelitis right great toe and first metatarsal bone, acute toxic encephalopathy/hospital delirium, aspiration pneumonia, sinus tachycardia, hypokalemia, elevated D-dimers with (-) LE dopplers, and urinary retention. 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  Clinical Impression  Pt seen for re-evaluation following partial ray amputation of R foot on 05/28/21. Podiatry reports pt is ideally NWB RLE but is cleared to perform transfers only with weight bearing through R heel in post op shoe. Pt received asleep in bed with sitter present, but easily awakened & educated on PT intervention. Pt appears HOH & only oriented to self, but does recall toe removal. PT educates him on weight bearing during transfers but question pt's ability to maintain these. Pt does require total assist for donning post op shoes but is able to complete bed mobility with CGA with hospital bed features & stand pivot transfer with RW & mod assist. Pt would benefit from STR upon d/c to maximize  independence with functional mobility & reduce fall risk prior to return home.   PT goals have been modified to reflect pt's current surgery & weight bearing status as pt is unable to ambulate at this time.      Follow Up Recommendations SNF    Equipment Recommendations  Wheelchair cushion (measurements PT);Wheelchair (measurements PT);Rolling walker with 5" wheels    Recommendations for Other Services       Precautions / Restrictions Precautions Precautions: Fall Restrictions Weight Bearing Restrictions: Yes RLE Weight Bearing:  (ideally NWB but podiatry cleared pt for WB through R heel for transfers only, in post op shoe) LLE Weight Bearing: Weight bearing as tolerated (in post op shoe)      Mobility  Bed Mobility Overal bed mobility: Needs Assistance Bed Mobility: Supine to Sit     Supine to sit: HOB elevated;Min guard     General bed mobility comments: extra time to complete movement, cuing/encouragement to attempt transitional movement as pt does instruct PT to move his legs over but is quite able to do it himself    Transfers Overall transfer level: Needs assistance Equipment used: Rolling walker (2 wheeled) Transfers: Sit to/from [employer] Sit to Stand: Mod assist;From elevated surface Stand pivot transfers: Mod assist;From elevated surface       General transfer comment: cuing for hand placement, education to minimize weight bearing through toes of R foot as much as possible but question pt's ability to do so  Ambulation/Gait                Stairs            Wheelchair Mobility    Modified Rankin (Stroke Patients Only)  Balance Overall balance assessment: Needs assistance Sitting-balance support: Feet supported;Bilateral upper extremity supported Sitting balance-Leahy Scale: Fair Sitting balance - Comments: supervision sitting EOB     Standing balance-Leahy Scale: Poor Standing balance comment: min/mod assist  for static standing & BUE support on RW                             Pertinent Vitals/Pain Pain Assessment: Faces Faces Pain Scale: No hurt    Home Living Family/patient expects to be discharged to:: Private residence Living Arrangements: Children Available Help at Discharge: Family;Available 24 hours/day Type of Home: House Home Access: Stairs to enter Entrance Stairs-Rails: Right;Left;Can reach both Entrance Stairs-Number of Steps: 4 Home Layout: One level Home Equipment: Grab bars - tub/shower;Grab bars - toilet;Walker - 2 wheels;Cane - quad      Prior Function Level of Independence: Needs assistance   Gait / Transfers Assistance Needed: Mod Ind amb with a QC limited community distances, no fall history  ADL's / Homemaking Assistance Needed: Family assists occasionally with ADLs although pt can do most of his dressing and bathing        Hand Dominance   Dominant Hand: Right    Extremity/Trunk Assessment   Upper Extremity Assessment Upper Extremity Assessment: Generalized weakness    Lower Extremity Assessment Lower Extremity Assessment: Generalized weakness (R foot with ace wrap dressing)       Communication   Communication: HOH  Cognition Arousal/Alertness: Awake/alert Behavior During Therapy: Flat affect Overall Cognitive Status: History of cognitive impairments - at baseline                                 General Comments: Oriented to self only, unaware of situation, location. PT attempts to orient pt. Unsure pt has any retention of education completed.      General Comments General comments (skin integrity, edema, etc.): Pt with gown & linens soiled with urine. Sitter assisted with changing external catheter & PT & sitter assisted with changing into clean gown.    Exercises General Exercises - Lower Extremity Long Arc Quad: AROM;Strengthening;Both;10 reps;Seated Hip Flexion/Marching: AROM;Strengthening;Both;5 reps;Seated  (requires encouragement to complete 5 reps as pt reports fatigue with exercise)   Assessment/Plan    PT Assessment Patient needs continued PT services  PT Problem List Decreased strength;Decreased activity tolerance;Decreased balance;Decreased knowledge of use of DME;Decreased knowledge of precautions;Decreased mobility;Decreased safety awareness;Decreased cognition;Decreased skin integrity       PT Treatment Interventions DME instruction;Gait training;Stair training;Functional mobility training;Therapeutic activities;Therapeutic exercise;Balance training;Patient/family education;Wheelchair mobility training;Modalities    PT Goals (Current goals can be found in the Care Plan section)  Acute Rehab PT Goals Patient Stated Goal: to get warm PT Goal Formulation: With patient Time For Goal Achievement: 06/12/21 Potential to Achieve Goals: Fair Additional Goals Additional Goal #1: Pt will propel w/c 100 ft with supervision to increase independence with mobility.    Frequency 7X/week   Barriers to discharge Decreased caregiver support;Inaccessible home environment stairs to enter home, unsure if family can provide necessary level of care upon d/c    Co-evaluation               AM-PAC PT "6 Clicks" Mobility  Outcome Measure Help needed turning from your back to your side while in a flat bed without using bedrails?: A Little Help needed moving from lying on your back to sitting on the side of  a flat bed without using bedrails?: A Lot Help needed moving to and from a bed to a chair (including a wheelchair)?: A Lot Help needed standing up from a chair using your arms (e.g., wheelchair or bedside chair)?: A Lot Help needed to walk in hospital room?: Total Help needed climbing 3-5 steps with a railing? : Total 6 Click Score: 11    End of Session Equipment Utilized During Treatment: Gait belt (BLE post op shoes) Activity Tolerance: Patient limited by fatigue Patient left: in  chair;with nursing/sitter in room Nurse Communication: Mobility status PT Visit Diagnosis: Unsteadiness on feet (R26.81);Difficulty in walking, not elsewhere classified (R26.2);Muscle weakness (generalized) (M62.81)    Time: 2924-4628 PT Time Calculation (min) (ACUTE ONLY): 18 min   Charges:   PT Evaluation $PT Re-evaluation: 1 Re-eval PT Treatments $Therapeutic Activity: 8-22 mins        Aleda Grana, PT, DPT 05/29/21, 10:36 AM   Sandi Mariscal 05/29/2021, 10:35 AM

## 2021-05-29 NOTE — Progress Notes (Signed)
PROGRESS NOTE    Rodney Kelly  TXM:[id number] DOB: 29-Mar-1924 DOA: 05/19/2021 PCP: Wilkie Aye, MD    Chief Complaint  Patient presents with   Wound Infection    Brief Narrative:   87 y.o. male with medical history significant of DM, HLD, PCD, GERD, CAD, MI, diabetic foot ulcer, dementia who presents with foot ulcer with infection, work-up significant for osteomyelitis, patient was seen by vascular surgery, no further work-up from their side, he has been followed by podiatry.  MRI significant for osteomyelitis, patient with severe hospital delirium, on 6/17 early a.m., patient with hypoxia, new oxygen requirement and chest x-ray showing aspiration in right lung base.  Hospital course complicated by severe delirium, aspiration pneumonia with dysphagia.  Assessment & Plan:   Principal Problem:   Diabetic foot infection (HCC) Active Problems:   Coronary artery disease   Type 2 diabetes mellitus with complication, without long-term current use of insulin (HCC)   Diabetic foot ulcers (HCC)   Hyperlipemia   GERD (gastroesophageal reflux disease)   Normocytic anemia   Cellulitis of right lower extremity   Right foot, cellulitis/myofasciitis with abscess,  osteomyelitis right great toe and first metatarsal bone. - Patient failed outpatient oral antibiotic doxycycline treatment.  - now s/p partial first ray amputation on 6/24, pt tolerated procedure well, path and cultures pending - abx with vanc (rash w/ cefazolin)  Rash Macular rash on back present since at least 6/21. likely drug reaction, ID agrees. Does appear to be slowly improving - monitor  Acute metabolic encephalopathy/severe hospital delirium - improved - CT head with no acute findings - delirium precautions  Aspiration pneumonia -Chest x-ray significant for right lung base opacity, on antibiotic coverage, seen closely by SLP, currently advanced to dysphagia 1 with nectar thick. S/p 5 days flagyl.  Resolved  Sinus tachycardia -Discussed EKG finding on 6/19 with Dr. Juliann Pares  who eviewed the EKG, doubt it is A. fib, most likely sinus tachycardia with PACs versus SVT,  Hypokalemia Resolved - monitor   Coronary artery disease:  -No CP. S/p of stent per his son -ASA, tricor, Imdur  Elevated D-dimers -Lower extremity Dopplers negative for DVT, continue with Lovenox 40 mg subcu daily.  Urinary retention -Foley catheter inserted 6/17, now removed, started on Flomax.     Type 2 diabetes mellitus with complication of foot ulcer, without long-term current use of insulin (HCC): Recent A1c 6.4, well controlled.  Patient taking metformin at home - resumed home metformin - monitor daily fasting sugar   Hyperlipemia -Fenofibrate -Patient is not taking Lipitor currently   GERD (gastroesophageal reflux disease) -Protonix   Normocytic anemia:  -Plan, this is most likely anemia of chronic disease    Son requested patient to be transferred to [facility], previous provider have called [facility] to requested transfer on 6/19, have discussed with internist on-call Dr. Seth Bake, given there is no indication for transfer there, and all services available at our facility, they have declined transfer especially they are at capacity, I have updated the son at  DVT prophylaxis: lovenox Code Status: Full Family Communication: son updated @ bedside 6/22 and by podiatry on 6/23 Disposition: SNF  Status is: Inpatient  Remains inpatient appropriate because:IV treatments appropriate due to intensity of illness or inability to take PO  Dispo: The patient is from: Home              Anticipated d/c is to: SNF, bed search underway  Patient currently is not medically stable to d/c.   Difficult to place patient No       Consultants:  Podiatry Vascular surgery ID  Subjective:  No significant events overnight. Breathing comfortably, tolerating diet.   Objective: Vitals:   05/28/21  1458 05/28/21 2051 05/29/21 0557 05/29/21 0750  BP: (!) 150/59 (!) 158/75 125/67 104/64  Pulse: 68 68 66 78  Resp: 18 18 16 18   Temp: (!) 97.5 F (36.4 C) 98.3 F (36.8 C) 98 F (36.7 C) 97.7 F (36.5 C)  TempSrc:  Oral Oral (P) Axillary  SpO2: 100% 97% 98% 96%  Weight:      Height:        Intake/Output Summary (Last 24 hours) at 05/29/2021 1124 Last data filed at 05/29/2021 1034 Gross per 24 hour  Intake 460 ml  Output 2200 ml  Net -1740 ml    Filed Weights   05/19/21 0722 05/28/21 1223  Weight: 59 kg 59 kg    Examination:  Frail elderly patient, sitting in recliner, with fluctuating mental status, confused, frail  Symmetrical Chest wall movement, rales at bases RRR,No Gallops,Rubs or new Murmurs,  +ve B.Sounds, Abd Soft, No tenderness, No rebound - guarding or rigidity. No Cyanosis, bilateral feet with Ace wrap. Macular rash on back improving   Data Reviewed: I have personally reviewed following labs and imaging studies  CBC: Recent Labs  Lab 05/25/21 0535 05/26/21 0423 05/26/21 1132 05/27/21 0602 05/29/21 0424  WBC 7.2 6.1 7.2 7.4 10.1  NEUTROABS  --   --  5.1  --   --   HGB 10.7* 9.9* 10.1* 10.9* 10.9*  HCT 32.9* 31.0* 31.8* 33.7* 34.0*  MCV 86.8 87.1 89.1 88.2 88.3  PLT 343 346 349 358 329    Basic Metabolic Panel: Recent Labs  Lab 05/22/21 2336 05/23/21 0514 05/23/21 0514 05/24/21 0422 05/25/21 0535 05/26/21 0423 05/27/21 0602 05/28/21 0457 05/29/21 0424  NA  --  139   < > 139 139 139 143  --  139  K  --  3.5   < > 3.5 3.3* 3.3* 3.7  --  3.8  CL  --  105   < > 104 104 104 103  --  101  CO2  --  23   < > 24 27 30  32  --  31  GLUCOSE  --  123*   < > 108* 133* 130* 134* 132* 135*  BUN  --  13   < > 14 16 13 12   --  13  CREATININE  --  0.71   < > 0.76 0.58* 0.62 0.66  --  0.54*  CALCIUM  --  9.2   < > 8.8* 9.1 8.8* 9.3  --  9.4  MG 1.7  --   --   --   --  1.8 2.0 1.9  --   PHOS  --  2.5  --   --  2.6 2.5  --   --   --    < > = values in  this interval not displayed.    GFR: Estimated Creatinine Clearance: 47.1 mL/min (A) (by C-G formula based on SCr of 0.54 mg/dL (L)).  Liver Function Tests: No results for input(s): AST, ALT, ALKPHOS, BILITOT, PROT, ALBUMIN in the last 168 hours.   CBG: Recent Labs  Lab 05/28/21 1147 05/28/21 1410 05/28/21 1500 05/28/21 2058 05/29/21 0744  GLUCAP 140* 130* 120* 131* 120*     Recent Results (from the past 240  hour(s))  Aerobic/Anaerobic Culture w Gram Stain (surgical/deep wound)     Status: None   Collection Time: 05/19/21 12:36 PM   Specimen: Wound  Result Value Ref Range Status   Specimen Description   Final    WOUND Performed at [facility], [address] [place], [place] [address]    Special Requests   Final    NONE Performed at [facility], [address] [place], [place] [address]    Gram Stain   Final    RARE WBC PRESENT, PREDOMINANTLY MONONUCLEAR RARE GRAM POSITIVE COCCI    Culture   Final    RARE STAPHYLOCOCCUS EPIDERMIDIS NO ANAEROBES ISOLATED Performed at [facility], [address] [place], [place] [address]    Report Status 05/24/2021 FINAL  Final   Organism ID, Bacteria STAPHYLOCOCCUS EPIDERMIDIS  Final      Susceptibility   Staphylococcus epidermidis - MIC*    CIPROFLOXACIN <=0.5 SENSITIVE Sensitive     ERYTHROMYCIN >=8 RESISTANT Resistant     GENTAMICIN <=0.5 SENSITIVE Sensitive     OXACILLIN <=0.25 SENSITIVE Sensitive     TETRACYCLINE >=16 RESISTANT Resistant     VANCOMYCIN 1 SENSITIVE Sensitive     TRIMETH/SULFA <=10 SENSITIVE Sensitive     CLINDAMYCIN <=0.25 SENSITIVE Sensitive     RIFAMPIN <=0.5 SENSITIVE Sensitive     Inducible Clindamycin NEGATIVE Sensitive     * RARE STAPHYLOCOCCUS EPIDERMIDIS  MRSA PCR Screening     Status: None   Collection Time: 05/20/21  3:50 PM  Result Value Ref Range Status   MRSA by PCR NEGATIVE NEGATIVE Final    Comment:        The GeneXpert MRSA Assay (FDA approved for  NASAL specimens only), is one component of a comprehensive MRSA colonization surveillance program. It is not intended to diagnose MRSA infection nor to guide or monitor treatment for MRSA infections. Performed at [facility], [address] [place], [place] [address]   Aerobic/Anaerobic Culture w Gram Stain (surgical/deep wound)     Status: None (Preliminary result)   Collection Time: 05/28/21  1:36 PM   Specimen: PATH Other; Tissue  Result Value Ref Range Status   Specimen Description   Final    WOUND Performed at [facility], [address] [place], [place] [address]    Special Requests   Final    RIGHT SESAMOID BONE Performed at [facility], [address] [place], [place] [address]    Gram Stain PENDING  Incomplete   Culture PENDING  Incomplete   Report Status PENDING  Incomplete         Radiology Studies: No results found.      Scheduled Meds:  vitamin C  250 mg Oral BID   aspirin  81 mg Oral Daily   Chlorhexidine Gluconate Cloth  6 each Topical Daily   enoxaparin (LOVENOX) injection  40 mg Subcutaneous Q24H   fenofibrate  160 mg Oral Daily   metFORMIN  1,000 mg Oral BID WC   multivitamin with minerals  1 tablet Oral Daily   mupirocin ointment   Topical Daily   pantoprazole  40 mg Oral Daily   phosphorus  500 mg Oral Daily   sodium chloride flush  10 mL Intravenous Q12H   tamsulosin  0.4 mg Oral QPC supper   Continuous Infusions:  vancomycin        LOS: 10 days       Silvano BilisNoah B Mariluz Crespo, MD [facility]  To contact the attending provider between 7A-7P or the covering provider during after hours 7P-7A, please log into the web site [email or web address] and access using universal [place] password for that web site. If you do not have the password, please call the hospital operator.  05/29/2021, 11:24 AM

## 2021-05-29 NOTE — Plan of Care (Signed)
  Problem: Health Behavior/Discharge Planning: Goal: Ability to manage health-related needs will improve Outcome: Progressing   

## 2021-05-29 NOTE — Plan of Care (Signed)
  Problem: Nutrition: Goal: Adequate nutrition will be maintained Outcome: Progressing   

## 2021-05-29 NOTE — Progress Notes (Signed)
Speech Language Pathology Treatment: Dysphagia  Patient Details Name: Rodney Kelly MRN: [id number] DOB: Jun 16, 1924 Today's Date: 05/29/2021 Time: 5784-6962 SLP Time Calculation (min) (ACUTE ONLY): 40 min  Assessment / Plan / Recommendation Clinical Impression  Pt seen for ongoing assessment of swallowing and toleration of diet upgrade -- trials of Nectar liquid consistency hopefully. Pt was NPO yesterday for Surgery; it appears he was placed back on a Pureed diet w/ thin liquids post Surgery (not back on Honey consistency liquids as previous diet/recommended). Pt was sitting in chair; Sitter present. Sitter stated pt ate "most" of his Puree foods at the breakfast meal w/ ease. He alerted and attended to this SLP given verbal cues.   W/ positioning and alertness, pt was given trials of Nectar liquids via Cup; no thin liquids yet. Overt Coughing both delayed/immediate noted w/ trials of Nectar liquids via Cup - pt appears to present w/ continued pharyngeal phase dysphagia suspect impacted by Baseline declined Cognitive status, baseline Dementia. ANY Cognitive decline impacts overall awareness/engagement and possible Timing of the pharyngeal swallow thus increases risk for aspiration. He required mod tactile/verbal cues for orientation to bolus presentation; and monitoring of bolus intake/size d/t Impulsive drinking behavior. Pt then consumed trials of Honey consistency liquids via Cup w/ no overt clinical s/s of aspiration noted; no decline in phonations, no cough, and no decline in respiratory status during/post trials. Oral phase was grossly [facility] for bolus management and oral clearing of the boluses given. Thin liquids were not assessed d/t pt's risk for aspiration.   Discussed diet consistency of foods/thickened liquids w/ Sitter, NSG; aspiration precautions; pills Crushed in puree; feeding support and supervision. Recommend continue a Dysphagia level 1(puree) w/ Honey liquids at this time  w/ objective swallowing assessment(mbss) next week; general aspiration precautions; reduce Distractions during meals. Pills Crushed in Puree for safer swallowing. Support w/ feeding at meals -- check for oral clearing and monitor Impulsive drinking behaviors. NSG/MD updated. Precautions posted in room.       HPI HPI: Pt is a 87 y.o. male with medical history significant of DM, HLD, PCD, GERD, CAD, MI, diabetic foot ulcer, dementia who presents with foot ulcer with infection.  Patient has chronic bilateral foot ulcers.  Patient has been followed up with Dr. Ether Griffins of podiatry. Per chart notes, Pt scheduled for first ray amputation and likely debridement of the ulcer on the left heel, per podiatry.  CXR: Coarse reticular changes are present in the right lung base with  additional airways thickening. Could reflect sequela of aspiration  in the appropriate clinical setting.  The more diffusely increased interstitial markings elsewhere are  nonspecific could reflect findings of an underlying interstitial  disease.   Recent Podiatry Surgery on 05/28/2021.      SLP Plan  Continue with current plan of care       Recommendations  Diet recommendations: Dysphagia 1 (puree);Honey-thick liquid Liquids provided via: Cup;No straw Medication Administration: Crushed with puree (for safer swallowing) Supervision: Staff to assist with self feeding;Patient able to self feed;Full supervision/cueing for compensatory strategies Compensations: Minimize environmental distractions;Slow rate;Small sips/bites;Lingual sweep for clearance of pocketing;Follow solids with liquid Postural Changes and/or Swallow Maneuvers: Out of bed for meals;Seated upright 90 degrees;Upright 30-60 min after meal                General recommendations:  (Dietician f/u; Pallative Care f/u for GOC and educatoin on Cognitive decline on swallowing) Oral Care Recommendations: Oral care BID;Oral care before and after PO;Staff/trained caregiver  to provide oral care Follow up Recommendations: Skilled Nursing facility (TBD) SLP Visit Diagnosis: Dysphagia, oropharyngeal phase (R13.12) (baseline Cognitive decline) Plan: Continue with current plan of care       GO                  Jerilynn Som, MS, CCC-SLP Speech Language Pathologist Rehab Services [phone] [facility] 05/29/2021, 2:07 PM

## 2021-05-30 LAB — BASIC METABOLIC PANEL
Anion gap: 6 (ref 5–15)
BUN: 27 mg/dL — ABNORMAL HIGH (ref 8–23)
CO2: 32 mmol/L (ref 22–32)
Calcium: 9.3 mg/dL (ref 8.9–10.3)
Chloride: 103 mmol/L (ref 98–111)
Creatinine, Ser: 0.91 mg/dL (ref 0.61–1.24)
GFR, Estimated: >60 mL/min (ref >60)
Glucose, Bld: 112 mg/dL — ABNORMAL HIGH (ref 70–99)
Potassium: 3.7 mmol/L (ref 3.5–5.1)
Sodium: 141 mmol/L (ref 135–145)

## 2021-05-30 MED ORDER — VANCOMYCIN HCL 750 MG/150ML IV SOLN
750.0000 mg | INTRAVENOUS | Status: DC
  Filled 2021-05-30: qty 150

## 2021-05-30 MED ORDER — DOXYCYCLINE HYCLATE 100 MG PO TABS
100.0000 mg | ORAL_TABLET | Freq: Two times a day (BID) | ORAL | Status: DC
  Administered 2021-05-30 – 2021-06-08 (×18): 100 mg via ORAL
  Filled 2021-05-30 (×18): qty 1

## 2021-05-30 NOTE — Progress Notes (Signed)
Pharmacy Antibiotic Note  Rodney Kelly is a 87 y.o. male admitted on 05/19/2021 with  diabetic foot ulcer .  MRI shows osteomyelitis.Pharmacy has been consulted for Vancomycin dosing. Patient was previously on Vancomycin and Cefepime then transitioned to Cefazolin but patient developed a rash thought to be due to Cefazolin. Cefazolin was discontinued on 6/22. Patient restarted on vancomycin 6/24 and underwent amputation.  Plan: Change vancomycin to 750 mg IV Q 24 hrs.  Goal AUC 400-550 Expected AUC: 455 SCr used: 0.91   Height: 5\' 9"  (175.3 cm) Weight: 59 kg (130 lb 1.1 oz) IBW/kg (Calculated) : 70.7  Temp (24hrs), Avg:98.3 F (36.8 C), Min:97.8 F (36.6 C), Max:98.9 F (37.2 C)  Recent Labs  Lab 05/25/21 0535 05/26/21 0423 05/26/21 1132 05/27/21 0602 05/29/21 0424 05/30/21 0413  WBC 7.2 6.1 7.2 7.4 10.1  --   CREATININE 0.58* 0.62  --  0.66 0.54* 0.91     Estimated Creatinine Clearance: 41.4 mL/min (by C-G formula based on SCr of 0.91 mg/dL).    Allergies  Allergen Reactions   Penicillins Other (See Comments)    unknown   Sulfa Antibiotics Other (See Comments)    unknown   Cephalosporins Rash    Developed maculopapular rash following treatment with cefepime to cefazolin June 2022    Antimicrobials this admission: Aztreonam 6/15 x 1 Vancomycin 6/15 >> 6/17, 6/24 >> Cefepime 6/16 >> 6/20 Cefazolin 6/20 >> 6/22 Metronidazole 6/17 >> 6/21  Dose adjustments this admission: 6/26 Vanc 1000 mg q24h >> 750 mg q24h   Microbiology results: 6/24 Wound (bone): pending 6/15 BCx: No growth 6/15 WCx: MSSE  6/16 MRSA PCR: negative  Thank you for allowing pharmacy to be a part of this patient's care.  7/16, PharmD Clinical Pharmacist 05/30/2021 9:25 AM

## 2021-05-30 NOTE — Progress Notes (Signed)
Physical Therapy Treatment Patient Details Name: Rodney Kelly MRN: [id number] DOB: [birth year] Today's Date: 05/30/2021    History of Present Illness Per MD notes: Pt is a 87 y.o. male with medical history significant of DM, HLD, PCD, GERD, CAD, MI, diabetic foot ulcer, dementia who presents with foot ulcer with infection, work-up significant for osteomyelitis, patient was seen by vascular surgery, no further work-up from their side, he has been followed by podiatry.  MRI significant for osteomyelitis, patient with severe hospital delirium, on 6/17 early a.m., patient with hypoxia, new oxygen requirement and chest x-ray showing aspiration in right lung base.  Hospital course complicated by severe delirium causing severe dysphagia, where he is strictly n.p.o. even for medications.  MD assessment summary includes: Right foot cellulitis/myofasciitis with abscess, osteomyelitis right great toe and first metatarsal bone, acute toxic encephalopathy/hospital delirium, aspiration pneumonia, sinus tachycardia, hypokalemia, elevated D-dimers with (-) LE dopplers, and urinary retention. 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    PT Comments    Pt seen for PT evaluation with pt able to complete bed mobility with hospital bed features but without physical assistance. However, pt demonstrates worsening balance compared to yesterday & requires MAX assist to power up to standing & MAX assist with 2nd person present for stand pivot transfers to recliner & BSC. Pt requires encouragement to stand ~1 minute to allow NT to perform peri hygiene. PT provides max multimodal cuing for proper technique for BLE strengthening exercises. Due to pt requiring more assistance on this date, recommend nursing staff use 2 person assist with RW to assist pt back to bed.  PT continues to educate pt on WB through RLE heel only but pt with poor demo of this &  takes multiple steps during each stand pivot transfer with pt unable to maintain proper weight bearing precautions. Pt would benefit from w/c training in near future to increase independence with mobility.   Follow Up Recommendations  SNF     Equipment Recommendations  Wheelchair cushion (measurements PT);Wheelchair (measurements PT);Rolling walker with 5" wheels    Recommendations for Other Services       Precautions / Restrictions Precautions Precautions: Fall Required Braces or Orthoses: Other Brace Other Brace: BLE post op shoes Restrictions Weight Bearing Restrictions: Yes RLE Weight Bearing:  (ideally NWB RLE but podiatry cleared pt for weight bearing through heel for transfers only in post op shoe) LLE Weight Bearing: Weight bearing as tolerated Other Position/Activity Restrictions: BLE post op shoes    Mobility  Bed Mobility Overal bed mobility: Needs Assistance Bed Mobility: Supine to Sit     Supine to sit: Supervision;HOB elevated     General bed mobility comments: HOB significantly elevated    Transfers Overall transfer level: Needs assistance Equipment used: Rolling walker (2 wheeled) Transfers: Sit to/from [employer] Sit to Stand: Max assist Stand pivot transfers: Max assist;+2 safety/equipment       General transfer comment: max multimodal cuing for proper hand placement (to push with 1UE and not pull up with BUE on RW) but poor evidence of learning, pt with poor anterior weight shift, begins to lean posteriorly, rotates BLE 2/2 poor weight bearing & use of BOS, requiring MAX assist for successful sit<>stand, also requires max assist +2nd person present for stand pivot transfers 2/2 posterior lean and poor balance during transfers, does not reach back for stable surface for stand>sit & PT assists with controlling descent  Ambulation/Gait                 Stairs             Wheelchair Mobility    Modified Rankin (Stroke  Patients Only)       Balance Overall balance assessment: Needs assistance Sitting-balance support: Feet supported;Bilateral upper extremity supported Sitting balance-Leahy Scale: Fair Sitting balance - Comments: supervision static sitting EOB     Standing balance-Leahy Scale: Zero Standing balance comment: mod/max for standing balance with BUE support on RW 2/2 posterior lean & overall impaired balance                            Cognition Arousal/Alertness: Awake/alert Behavior During Therapy: Flat affect Overall Cognitive Status: History of cognitive impairments - at baseline                                 General Comments: Oriented to self only, poor retaining of information      Exercises General Exercises - Lower Extremity Long Arc Quad: AROM;Strengthening;Both;10 reps;Seated Hip ABduction/ADduction: AROM;Strengthening;Both;10 reps;Seated (hip abduction pillow squeezes) Hip Flexion/Marching: AROM;Strengthening;Both;Seated;10 reps    General Comments General comments (skin integrity, edema, etc.): pt assisted bed>recliner>BSC>recliner, pt with continent BM on BSC but requires dependent assist for peri hygiene      Pertinent Vitals/Pain Pain Assessment: No/denies pain    Home Living                      Prior Function            PT Goals (current goals can now be found in the care plan section) Acute Rehab PT Goals Patient Stated Goal: none stated today PT Goal Formulation: With patient Time For Goal Achievement: 06/12/21 Potential to Achieve Goals: Fair Additional Goals Additional Goal #1: Pt will propel w/c 100 ft with supervision to increase independence with mobility. Progress towards PT goals: Progressing toward goals    Frequency    7X/week      PT Plan Current plan remains appropriate    Co-evaluation              AM-PAC PT "6 Clicks" Mobility   Outcome Measure  Help needed turning from your back to  your side while in a flat bed without using bedrails?: A Little Help needed moving from lying on your back to sitting on the side of a flat bed without using bedrails?: A Lot Help needed moving to and from a bed to a chair (including a wheelchair)?: Total Help needed standing up from a chair using your arms (e.g., wheelchair or bedside chair)?: Total Help needed to walk in hospital room?: Total Help needed climbing 3-5 steps with a railing? : Total 6 Click Score: 9    End of Session Equipment Utilized During Treatment: Gait belt (BLE post op shoes) Activity Tolerance: Patient tolerated treatment well Patient left: in chair;with nursing/sitter in room (set up with meal tray) Nurse Communication: Mobility status PT Visit Diagnosis: Unsteadiness on feet (R26.81);Difficulty in walking, not elsewhere classified (R26.2);Muscle weakness (generalized) (M62.81)     Time: 2119-4174 PT Time Calculation (min) (ACUTE ONLY): 23 min  Charges:  $Therapeutic Activity: 23-37 mins                     Aleda Grana, PT, DPT 05/30/21, 12:06 PM  Sandi Mariscal 05/30/2021, 12:04 PM

## 2021-05-30 NOTE — Progress Notes (Signed)
Daily Progress Note   Subjective  - 2 Days Post-Op  Follow-up right first ray amputation.  He is doing well.  Still some confusion.  Objective Vitals:   05/29/21 1931 05/29/21 2356 05/30/21 0403 05/30/21 0727  BP: (!) 106/49 (!) 112/57 (!) 112/57 113/74  Pulse: 95 90 68 79  Resp: 16 18 18 16   Temp: 98.9 F (37.2 C) 98.4 F (36.9 C) 97.8 F (36.6 C) 98 F (36.7 C)  TempSrc: Oral Oral Oral Oral  SpO2: 99% 92% 98% 91%  Weight:      Height:        Physical Exam: Incision site continues to heal nicely.  Just a scant amount of bleeding to the plantar ulceration.  No drainage from the incision itself.  Laboratory CBC    Component Value Date/Time   WBC 10.1 05/29/2021 0424   HGB 10.9 (L) 05/29/2021 0424   HCT 34.0 (L) 05/29/2021 0424   PLT 329 05/29/2021 0424    BMET    Component Value Date/Time   NA 141 05/30/2021 0413   K 3.7 05/30/2021 0413   CL 103 05/30/2021 0413   CO2 32 05/30/2021 0413   GLUCOSE 112 (H) 05/30/2021 0413   BUN 27 (H) 05/30/2021 0413   CREATININE 0.91 05/30/2021 0413   CALCIUM 9.3 05/30/2021 0413   GFRNONAA >60 05/30/2021 0413   GFRAA >60 09/28/2018 0749    Assessment/Planning: Status post partial first ray amputation for osteomyelitis right foot  Wound looks to be healing nicely.  No signs of residual acute infection.  Infectious disease is following at this time.  From podiatry standpoint I believe he will be stable for discharge.  Upon discharge dressings can be changed once to twice a week if patient is at a skilled nursing facility.  Just a dry dressing can be used on the right foot.  Should continue with 3 times a week padded dressing to the left heel.  Follow-up with podiatry in 2 weeks in the outpatient clinic.   09/30/2018 A  05/30/2021, 9:03 AM

## 2021-05-30 NOTE — TOC Progression Note (Signed)
Transition of Care [facility]) - Progression Note    Patient Details  Name: Rodney Kelly MRN: [id number] Date of Birth: November 01, 1924  [facility] CM/SW Contact  Ashley Royalty Lutricia Feil, RN Phone Number:[phone] 05/30/2021, 11:05 AM  Clinical Narrative:    Recommendations for SNF via PT/OT eval. Spoke with son Onalee Hua [phone]) who is at bedside today on options. Son prefers pt  receives rehabilitation in the [place] area. RN provider several facilities however requested son to choose specific SNF for placement. Son requested to discuss options with his spouse prior to confirming locations for pt's SNF placement. Son requested to call TOC back on tomorrow for facilities chosen. Provided contact number for call back once confirmed.  TOC will continue to follow up on SNF placement.    Expected Discharge Plan: Home w Home Health Services Barriers to Discharge: Continued Medical Work up  Expected Discharge Plan and Services Expected Discharge Plan: Home w Home Health Services   Discharge Planning Services: CM Consult Post Acute Care Choice: Home Health                                         Social Determinants of Health (SDOH) Interventions    Readmission Risk Interventions No flowsheet data found.

## 2021-05-30 NOTE — Progress Notes (Signed)
PROGRESS NOTE    Rodney Kelly  PYK:[id number] DOB: 1924/02/28 DOA: 05/19/2021 PCP: Wilkie Aye, MD    Chief Complaint  Patient presents with   Wound Infection    Brief Narrative:   87 y.o. male with medical history significant of DM, HLD, PCD, GERD, CAD, MI, diabetic foot ulcer, dementia who presents with foot ulcer with infection, work-up significant for osteomyelitis, patient was seen by vascular surgery, no further work-up from their side, he has been followed by podiatry.  MRI significant for osteomyelitis, patient with severe hospital delirium, on 6/17 early a.m., patient with hypoxia, new oxygen requirement and chest x-ray showing aspiration in right lung base.  Hospital course complicated by severe delirium, aspiration pneumonia with dysphagia.  Assessment & Plan:   Principal Problem:   Diabetic foot infection (HCC) Active Problems:   Coronary artery disease   Type 2 diabetes mellitus with complication, without long-term current use of insulin (HCC)   Diabetic foot ulcers (HCC)   Hyperlipemia   GERD (gastroesophageal reflux disease)   Normocytic anemia   Cellulitis of right lower extremity   Right foot, cellulitis/myofasciitis with abscess,  osteomyelitis right great toe and first metatarsal bone. - Patient failed outpatient oral antibiotic doxycycline treatment.  - now s/p partial first ray amputation on 6/24, pt tolerated procedure well, path and cultures pending - abx with vanc (rash w/ cefazolin) - ID consulting, tomorrow will make plans for course and duration - podiatry advising 2x weekly dry dressing changes for surgical site on right foot and 3x weekly padded dressing change to left heel, and 2 wk f/u in podiatry clinic (Dr. Ether Griffins)  Drug Rash Macular rash on back present since at least 6/21. likely drug reaction, ID agrees. Does appear to be slowly improving - monitor  Acute metabolic encephalopathy/severe hospital delirium - improved -  CT head with no acute findings - delirium precautions  Aspiration pneumonia -Chest x-ray significant for right lung base opacity, on antibiotic coverage, seen closely by SLP, currently advanced to dysphagia 1 with nectar thick. S/p 5 days flagyl. Resolved  Sinus tachycardia -Discussed EKG finding on 6/19 with Dr. Juliann Pares  who eviewed the EKG, doubt it is A. fib, most likely sinus tachycardia with PACs versus SVT,  Hypokalemia Resolved - monitor   Coronary artery disease:  -No CP. S/p of stent per his son -ASA, tricor, Imdur  Elevated D-dimers -Lower extremity Dopplers negative for DVT, continue with Lovenox 40 mg subcu daily.  Urinary retention -Foley catheter inserted 6/17, now removed, started on Flomax.     Type 2 diabetes mellitus with complication of foot ulcer, without long-term current use of insulin (HCC): Recent A1c 6.4, well controlled.  Patient taking metformin at home - resumed home metformin - monitor daily fasting sugar   Hyperlipemia -Fenofibrate -Patient is not taking Lipitor currently   GERD (gastroesophageal reflux disease) -Protonix   Normocytic anemia:  - most likely anemia of chronic disease    Son requested patient to be transferred to [facility], previous provider have called [facility] to requested transfer on 6/19, have discussed with internist on-call Dr. Seth Bake, given there is no indication for transfer there, and all services available at our facility, they have declined transfer especially they are at capacity, I have updated the son at  DVT prophylaxis: lovenox Code Status: Full Family Communication: son updated @ bedside 6/22 and by podiatry on 6/25 Disposition: SNF  Status is: Inpatient  Remains inpatient appropriate because:IV treatments appropriate due to intensity of illness or  inability to take PO  Dispo: The patient is from: Home              Anticipated d/c is to: SNF, bed search underway              Patient currently is not  medically stable to d/c.   Difficult to place patient No       Consultants:  Podiatry Vascular surgery ID  Subjective:  No significant events overnight. Breathing comfortably, tolerating diet. Denies pain   Objective: Vitals:   05/29/21 1931 05/29/21 2356 05/30/21 0403 05/30/21 0727  BP: (!) 106/49 (!) 112/57 (!) 112/57 113/74  Pulse: 95 90 68 79  Resp: 16 18 18 16   Temp: 98.9 F (37.2 C) 98.4 F (36.9 C) 97.8 F (36.6 C) 98 F (36.7 C)  TempSrc: Oral Oral Oral Oral  SpO2: 99% 92% 98% 91%  Weight:      Height:        Intake/Output Summary (Last 24 hours) at 05/30/2021 1307 Last data filed at 05/30/2021 1018 Gross per 24 hour  Intake 180 ml  Output 251 ml  Net -71 ml    Filed Weights   05/19/21 0722 05/28/21 1223  Weight: 59 kg 59 kg    Examination:  Frail elderly patient, sitting in recliner,somewhat confused, calm Symmetrical Chest wall movement, rales at bases RRR,No Gallops,Rubs or new Murmurs,  +ve B.Sounds, Abd Soft, No tenderness, No rebound - guarding or rigidity. No Cyanosis, bilateral feet with Ace wrap. Macular rash on back improving   Data Reviewed: I have personally reviewed following labs and imaging studies  CBC: Recent Labs  Lab 05/25/21 0535 05/26/21 0423 05/26/21 1132 05/27/21 0602 05/29/21 0424  WBC 7.2 6.1 7.2 7.4 10.1  NEUTROABS  --   --  5.1  --   --   HGB 10.7* 9.9* 10.1* 10.9* 10.9*  HCT 32.9* 31.0* 31.8* 33.7* 34.0*  MCV 86.8 87.1 89.1 88.2 88.3  PLT 343 346 349 358 329    Basic Metabolic Panel: Recent Labs  Lab 05/25/21 0535 05/26/21 0423 05/27/21 0602 05/28/21 0457 05/29/21 0424 05/30/21 0413  NA 139 139 143  --  139 141  K 3.3* 3.3* 3.7  --  3.8 3.7  CL 104 104 103  --  101 103  CO2 27 30 32  --  31 32  GLUCOSE 133* 130* 134* 132* 135* 112*  BUN 16 13 12   --  13 27*  CREATININE 0.58* 0.62 0.66  --  0.54* 0.91  CALCIUM 9.1 8.8* 9.3  --  9.4 9.3  MG  --  1.8 2.0 1.9  --   --   PHOS 2.6 2.5  --   --    --   --     GFR: Estimated Creatinine Clearance: 41.4 mL/min (by C-G formula based on SCr of 0.91 mg/dL).  Liver Function Tests: No results for input(s): AST, ALT, ALKPHOS, BILITOT, PROT, ALBUMIN in the last 168 hours.   CBG: Recent Labs  Lab 05/28/21 1410 05/28/21 1500 05/28/21 2058 05/29/21 0744 05/29/21 2044  GLUCAP 130* 120* 131* 120* 177*     Recent Results (from the past 240 hour(s))  MRSA PCR Screening     Status: None   Collection Time: 05/20/21  3:50 PM  Result Value Ref Range Status   MRSA by PCR NEGATIVE NEGATIVE Final    Comment:        The GeneXpert MRSA Assay (FDA approved for NASAL specimens only), is one  component of a comprehensive MRSA colonization surveillance program. It is not intended to diagnose MRSA infection nor to guide or monitor treatment for MRSA infections. Performed at [facility], [address] [place], [place] [address]   Aerobic/Anaerobic Culture w Gram Stain (surgical/deep wound)     Status: None (Preliminary result)   Collection Time: 05/28/21  1:36 PM   Specimen: PATH Other; Tissue  Result Value Ref Range Status   Specimen Description   Final    WOUND Performed at [facility], [address] [place], [place] [address]    Special Requests   Final    RIGHT SESAMOID BONE Performed at [facility], [address], [place], [place] [address]    Gram Stain PENDING  Incomplete   Culture PENDING  Incomplete   Report Status PENDING  Incomplete         Radiology Studies: No results found.      Scheduled Meds:  vitamin C  250 mg Oral BID   aspirin  81 mg Oral Daily   Chlorhexidine Gluconate Cloth  6 each Topical Daily   doxycycline  100 mg Oral Q12H   enoxaparin (LOVENOX) injection  40 mg Subcutaneous Q24H   fenofibrate  160 mg Oral Daily   metFORMIN  1,000 mg Oral BID WC   multivitamin with minerals  1 tablet Oral Daily   mupirocin ointment   Topical Daily   pantoprazole  40 mg Oral  Daily   sodium chloride flush  10 mL Intravenous Q12H   tamsulosin  0.4 mg Oral QPC supper   Continuous Infusions:      LOS: 11 days       Silvano Bilis, MD [facility]   To contact the attending provider between 7A-7P or the covering provider during after hours 7P-7A, please log into the web site [email or web address] and access using universal [place] password for that web site. If you do not have the password, please call the hospital operator.  05/30/2021, 1:07 PM

## 2021-05-31 ENCOUNTER — Inpatient Hospital Stay: Payer: Medicare PPO

## 2021-05-31 ENCOUNTER — Encounter: Payer: Self-pay | Admitting: Podiatry

## 2021-05-31 LAB — BASIC METABOLIC PANEL
Anion gap: 5 (ref 5–15)
BUN: 30 mg/dL — ABNORMAL HIGH (ref 8–23)
CO2: 33 mmol/L — ABNORMAL HIGH (ref 22–32)
Calcium: 9.4 mg/dL (ref 8.9–10.3)
Chloride: 103 mmol/L (ref 98–111)
Creatinine, Ser: 0.72 mg/dL (ref 0.61–1.24)
GFR, Estimated: >60 mL/min (ref >60)
Glucose, Bld: 133 mg/dL — ABNORMAL HIGH (ref 70–99)
Potassium: 4.2 mmol/L (ref 3.5–5.1)
Sodium: 141 mmol/L (ref 135–145)

## 2021-05-31 NOTE — TOC Progression Note (Signed)
Transition of Care [facility]) - Progression Note    Patient Details  Name: Rodney Kelly MRN: [id number] Date of Birth: 28-Feb-1924  Transition of Care [facility]) CM/SW Contact  Caryn Section, RN Phone Number: 05/31/2021, 3:56 PM  Clinical Narrative:  Referral sent to [facility], Revonda Standard at [facility] notified at lunchtime.  Revonda Standard at [facility] just notified RNCM that [facility] does not have access to the hub, she requested that I send to [place].  Revonda Standard notified me at 1649 that [facility] cannot take this patient due to admissions caps.  Will contact family and another [facility] facility.  Awaiting response.    Expected Discharge Plan: Home w Home Health Services Barriers to Discharge: Continued Medical Work up  Expected Discharge Plan and Services Expected Discharge Plan: Home w Home Health Services   Discharge Planning Services: CM Consult Post Acute Care Choice: Home Health                                         Social Determinants of Health (SDOH) Interventions    Readmission Risk Interventions No flowsheet data found.

## 2021-05-31 NOTE — Progress Notes (Signed)
PT Cancellation Note  Patient Details Name: Rodney Kelly MRN: [id number] DOB: September 04, 1924   Cancelled Treatment:    Reason Eval/Treat Not Completed: Patient at procedure or test/unavailable Attempted to see pt for PT session but pt out of room at swallow study. Will f/u as able & as pt is able to participate.   Aleda Grana, PT, DPT 05/31/21, 2:59 PM    Sandi Mariscal 05/31/2021, 2:59 PM

## 2021-05-31 NOTE — TOC Progression Note (Signed)
Transition of Care [facility]) - Progression Note    Patient Details  Name: Rodney Kelly MRN: [id number] Date of Birth: 05/29/1924  [facility]) CM/SW Contact  Caryn Section, RN Phone Number: 05/31/2021, 3:22 PM  Clinical Narrative:   Son confirmed [facility] was their first choice.  Sent information to [facility], Lesly Dukes is reviewing and will get back to me.    Expected Discharge Plan: Home w Home Health Services Barriers to Discharge: Continued Medical Work up  Expected Discharge Plan and Services Expected Discharge Plan: Home w Home Health Services   Discharge Planning Services: CM Consult Post Acute Care Choice: Home Health                                         Social Determinants of Health (SDOH) Interventions    Readmission Risk Interventions No flowsheet data found.

## 2021-05-31 NOTE — Care Management Important Message (Signed)
Important Message  Patient Details  Name: Cruze Zingaro MRN: [id number] Date of Birth: 1924/11/19   Medicare Important Message Given:  Yes     Johnell Comings 05/31/2021, 11:34 AM

## 2021-05-31 NOTE — Progress Notes (Signed)
Pt tolerated wound care without pain meds or c/o of pain. No foul odor or drainage noted. Pt tolerated dressing change w/o discomfort. Rodney Kelly.

## 2021-05-31 NOTE — Progress Notes (Addendum)
PROGRESS NOTE    Rodney Kelly  QPY:[id number] DOB: 19-Sep-1924 DOA: 05/19/2021 PCP: Wilkie Aye, MD    Chief Complaint  Patient presents with   Wound Infection    Brief Narrative:   87 y.o. male with medical history significant of DM, HLD, PCD, GERD, CAD, MI, diabetic foot ulcer, dementia who presents with foot ulcer with infection, work-up significant for osteomyelitis, patient was seen by vascular surgery, no further work-up from their side, he has been followed by podiatry.  MRI significant for osteomyelitis, patient with severe hospital delirium, on 6/17 early a.m., patient with hypoxia, new oxygen requirement and chest x-ray showing aspiration in right lung base.  Hospital course complicated by severe delirium, aspiration pneumonia with dysphagia.  Assessment & Plan:   Principal Problem:   Diabetic foot infection (HCC) Active Problems:   Coronary artery disease   Type 2 diabetes mellitus with complication, without long-term current use of insulin (HCC)   Diabetic foot ulcers (HCC)   Hyperlipemia   GERD (gastroesophageal reflux disease)   Normocytic anemia   Cellulitis of right lower extremity   Right foot, cellulitis/myofasciitis with abscess,  osteomyelitis right great toe and first metatarsal bone. - Patient failed outpatient oral antibiotic doxycycline treatment.  - now s/p partial first ray amputation on 6/24, pt tolerated procedure well, path and cultures pending - abx with vanc (rash w/ cefazolin) - ID consulting, to see today - podiatry advising 2x weekly dry dressing changes for surgical site on right foot and 3x weekly padded dressing change to left heel, and 2 wk f/u in podiatry clinic (Dr. Ether Griffins)  Drug Rash Macular rash on back present since at least 6/21. likely drug reaction, ID agrees. Does appear to be slowly improving - monitor  Acute metabolic encephalopathy/severe hospital delirium - improved - CT head with no acute findings -  delirium precautions  Aspiration pneumonia Aspiration risk -Chest x-ray significant for right lung base opacity, on antibiotic coverage, seen closely by SLP, currently advanced to dysphagia 1 with nectar thick. S/p 5 days flagyl. Resolved. Per SLP remains high risk for aspiration. Discussed w/ son, no family interest in feeding tube, wants to continue with current diet, aware of aspiration risk.  Sinus tachycardia -Discussed EKG finding on 6/19 with Dr. Juliann Pares  who eviewed the EKG, doubt it is A. fib, most likely sinus tachycardia with PACs versus SVT,  Hypokalemia Resolved - monitor   Coronary artery disease:  -No CP. S/p of stent per his son -ASA, tricor, Imdur  Elevated D-dimers -Lower extremity Dopplers negative for DVT, continue with Lovenox 40 mg subcu daily.  Urinary retention -Foley catheter inserted 6/17, now removed, started on Flomax.     Type 2 diabetes mellitus with complication of foot ulcer, without long-term current use of insulin (HCC): Recent A1c 6.4, well controlled.  Patient taking metformin at home - resumed home metformin - monitor daily fasting sugar   Hyperlipemia -Fenofibrate -Patient is not taking Lipitor currently   GERD (gastroesophageal reflux disease) -Protonix   Normocytic anemia:  - most likely anemia of chronic disease    Son requested patient to be transferred to [facility] previous provider have called [facility] to requested transfer on 6/19, have discussed with internist on-call Dr. Seth Bake, given there is no indication for transfer there, and all services available at our facility, they have declined transfer especially they are at capacity, I have updated the son at  DVT prophylaxis: lovenox Code Status: Full Family Communication: son updated telephonically 6/27 Disposition: SNF  Status is: Inpatient  Remains inpatient appropriate because:IV treatments appropriate due to intensity of illness or inability to take PO  Dispo: The patient  is from: Home              Anticipated d/c is to: SNF, bed search underway              Patient currently is not medically stable to d/c.   Difficult to place patient No       Consultants:  Podiatry Vascular surgery ID  Subjective:  No significant events overnight. Breathing comfortably, tolerating diet. Denies pain. No acting out   Objective: Vitals:   05/30/21 1546 05/30/21 1946 05/31/21 0343 05/31/21 0905  BP: (!) 108/58 132/75 (!) 106/55 116/64  Pulse: 78 88 97 79  Resp: 18 16 16 16   Temp: (!) 97.5 F (36.4 C) 98 F (36.7 C) 98.4 F (36.9 C) 98 F (36.7 C)  TempSrc: Oral Oral Oral Oral  SpO2: 91% 95% 94% 96%  Weight:      Height:        Intake/Output Summary (Last 24 hours) at 05/31/2021 1046 Last data filed at 05/31/2021 1002 Gross per 24 hour  Intake 340 ml  Output 1000 ml  Net -660 ml    Filed Weights   05/19/21 0722 05/28/21 1223  Weight: 59 kg 59 kg    Examination:  Frail elderly patient, sitting in recliner,somewhat confused, calm Symmetrical Chest wall movement, rales at bases RRR,No Gallops,Rubs or new Murmurs,  +ve B.Sounds, Abd Soft, No tenderness, No rebound - guarding or rigidity. No Cyanosis, bilateral feet with Ace wrap. Macular rash on back improving   Data Reviewed: I have personally reviewed following labs and imaging studies  CBC: Recent Labs  Lab 05/25/21 0535 05/26/21 0423 05/26/21 1132 05/27/21 0602 05/29/21 0424  WBC 7.2 6.1 7.2 7.4 10.1  NEUTROABS  --   --  5.1  --   --   HGB 10.7* 9.9* 10.1* 10.9* 10.9*  HCT 32.9* 31.0* 31.8* 33.7* 34.0*  MCV 86.8 87.1 89.1 88.2 88.3  PLT 343 346 349 358 329    Basic Metabolic Panel: Recent Labs  Lab 05/25/21 0535 05/26/21 0423 05/27/21 0602 05/28/21 0457 05/29/21 0424 05/30/21 0413 05/31/21 0515  NA 139 139 143  --  139 141 141  K 3.3* 3.3* 3.7  --  3.8 3.7 4.2  CL 104 104 103  --  101 103 103  CO2 27 30 32  --  31 32 33*  GLUCOSE 133* 130* 134* 132* 135* 112* 133*   BUN 16 13 12   --  13 27* 30*  CREATININE 0.58* 0.62 0.66  --  0.54* 0.91 0.72  CALCIUM 9.1 8.8* 9.3  --  9.4 9.3 9.4  MG  --  1.8 2.0 1.9  --   --   --   PHOS 2.6 2.5  --   --   --   --   --     GFR: Estimated Creatinine Clearance: 47.1 mL/min (by C-G formula based on SCr of 0.72 mg/dL).  Liver Function Tests: No results for input(s): AST, ALT, ALKPHOS, BILITOT, PROT, ALBUMIN in the last 168 hours.   CBG: Recent Labs  Lab 05/28/21 1410 05/28/21 1500 05/28/21 2058 05/29/21 0744 05/29/21 2044  GLUCAP 130* 120* 131* 120* 177*     Recent Results (from the past 240 hour(s))  Aerobic/Anaerobic Culture w Gram Stain (surgical/deep wound)     Status: None (Preliminary result)   Collection Time:  05/28/21  1:36 PM   Specimen: PATH Other; Tissue  Result Value Ref Range Status   Specimen Description   Final    WOUND Performed at [facility], [address] [place], [place] [address]    Special Requests RIGHT SESAMOID BONE  Final   Gram Stain   Final    RARE WBC PRESENT,BOTH PMN AND MONONUCLEAR RARE GRAM POSITIVE COCCI RARE GRAM POSITIVE RODS    Culture   Final    CULTURE REINCUBATED FOR BETTER GROWTH Performed at [facility], [address] [place], [place] [address]    Report Status PENDING  Incomplete         Radiology Studies: No results found.      Scheduled Meds:  vitamin C  250 mg Oral BID   aspirin  81 mg Oral Daily   Chlorhexidine Gluconate Cloth  6 each Topical Daily   doxycycline  100 mg Oral Q12H   enoxaparin (LOVENOX) injection  40 mg Subcutaneous Q24H   fenofibrate  160 mg Oral Daily   metFORMIN  1,000 mg Oral BID WC   multivitamin with minerals  1 tablet Oral Daily   mupirocin ointment   Topical Daily   pantoprazole  40 mg Oral Daily   sodium chloride flush  10 mL Intravenous Q12H   tamsulosin  0.4 mg Oral QPC supper   Continuous Infusions:      LOS: 12 days       Silvano Bilis, MD [facility]   To  contact the attending provider between 7A-7P or the covering provider during after hours 7P-7A, please log into the web site [email or web address] and access using universal [place] password for that web site. If you do not have the password, please call the hospital operator.  05/31/2021, 10:46 AM

## 2021-05-31 NOTE — TOC Progression Note (Signed)
Transition of Care [facility]) - Progression Note    Patient Details  Name: Rodney Kelly MRN: [id number] Date of Birth: [birth year]  Transition of Care [facility]) CM/SW Contact  Caryn Section, RN Phone Number: 05/31/2021, 11:25 AM  Clinical Narrative:  Son will get back to [facility] about SNF choice,  RNCM left message on son's phone, awaiting response. TOC will follow to discharge.    Expected Discharge Plan: Home w Home Health Services Barriers to Discharge: Continued Medical Work up  Expected Discharge Plan and Services Expected Discharge Plan: Home w Home Health Services   Discharge Planning Services: CM Consult Post Acute Care Choice: Home Health                                         Social Determinants of Health (SDOH) Interventions    Readmission Risk Interventions No flowsheet data found.

## 2021-05-31 NOTE — Progress Notes (Signed)
   Date of Admission:  05/19/2021     ID: Rodney Kelly is a 87 y.o. male  Principal Problem:   Diabetic foot infection (HCC) Active Problems:   Coronary artery disease   Type 2 diabetes mellitus with complication, without long-term current use of insulin (HCC)   Diabetic foot ulcers (HCC)   Hyperlipemia   GERD (gastroesophageal reflux disease)   Normocytic anemia   Cellulitis of right lower extremity    Subjective: He wants to go home  Medications:   vitamin C  250 mg Oral BID   aspirin  81 mg Oral Daily   Chlorhexidine Gluconate Cloth  6 each Topical Daily   doxycycline  100 mg Oral Q12H   enoxaparin (LOVENOX) injection  40 mg Subcutaneous Q24H   fenofibrate  160 mg Oral Daily   metFORMIN  1,000 mg Oral BID WC   multivitamin with minerals  1 tablet Oral Daily   mupirocin ointment   Topical Daily   pantoprazole  40 mg Oral Daily   sodium chloride flush  10 mL Intravenous Q12H   tamsulosin  0.4 mg Oral QPC supper    Objective: Vital signs in last 24 hours: Temp:  [97.5 F (36.4 C)-98.4 F (36.9 C)] 98 F (36.7 C) (06/27 0905) Pulse Rate:  [78-97] 79 (06/27 0905) Resp:  [16-18] 16 (06/27 0905) BP: (106-132)/(55-75) 116/64 (06/27 0905) SpO2:  [91 %-96 %] 96 % (06/27 0905)  PHYSICAL EXAM:  General: Awake, slightly confused, responds to some questions appropriately Oriented in person  Lungs: Bilateral air entry Heart: Irregular. Abdomen: Soft, non-tender,not distended.  Extremities: Right foot dressing not removed Picture reviewed  Skin: Macular rash over the trunk on the back improved Lymph: Cervical, supraclavicular normal. Neurologic: Grossly non-focal  Lab Results Recent Labs    05/29/21 0424 05/30/21 0413 05/31/21 0515  WBC 10.1  --   --   HGB 10.9*  --   --   HCT 34.0*  --   --   NA 139 141 141  K 3.8 3.7 4.2  CL 101 103 103  CO2 31 32 33*  BUN 13 27* 30*  CREATININE 0.54* 0.91 0.72    Microbiology: Wound culture pending Bone  pathology pending  Studies/Results: No results found.   Assessment/Plan: Right foot wound on the first MTP with underlying osteomyelitis which has been chronic.  He underwent first ray amputation on 05/28/2021.  Cultures have been sent.  Bone biopsy is pending.  After discussion with Dr. Ether Griffins podiatrist switched vancomycin to p.o. doxycycline 100 mg p.o. twice daily.  The duration of the antibiotic will depend on the bone  biopsy and culture.   Very likely may not need more than a week.  There is no need for IV antibiotics as this very likely a therapeutic amputation.  Rash due to cephalosporin is improved  Encephalopathy has resolved  Dementia with some confusion  Aspiration pneumonitis has resolved but high risk for future episodes. Discussed the management with the care team.

## 2021-05-31 NOTE — Evaluation (Addendum)
Objective Swallowing Evaluation: Type of Study: MBS-Modified Barium Swallow Study   Patient Details  Name: Rodney Kelly MRN: [id number] Date of Birth: 1924-01-03  Today's Date: 05/31/2021 Time: SLP Start Time (ACUTE ONLY): 1410 -SLP Stop Time (ACUTE ONLY): 1510  SLP Time Calculation (min) (ACUTE ONLY): 60 min   Past Medical History:  Past Medical History:  Diagnosis Date   Anginal pain (HCC)    Arthritis    Complication of anesthesia    spinal anesthesia caused nerve damage in 2002   Coronary artery disease    Diabetes mellitus without complication (HCC)    md stopped metformin   Diabetic ulcer of left foot (HCC)    GERD (gastroesophageal reflux disease)    Hyperlipemia    Multiple thyroid nodules    Myocardial infarction (HCC)    STEMI   Osteoporosis    Peripheral nerve disease    Ulceration of colon    NSAIDS   Vitamin D deficiency    Past Surgical History:  Past Surgical History:  Procedure Laterality Date   ABDOMINAL AORTIC ANEURYSM REPAIR  2013   ACHILLES TENDON SURGERY Left 09/28/2018   Procedure: ACHILLES TENDON MICROTENOTOMY-TAL;  Surgeon: Gwyneth Revels, DPM;  Location: [facility] ORS;  Service: Podiatry;  Laterality: Left;   AMPUTATION Right 05/28/2021   Procedure: AMPUTATION RAY-FIRST RAY;  Surgeon: Gwyneth Revels, DPM;  Location: [facility] ORS;  Service: Podiatry;  Laterality: Right;   CARPAL TUNNEL RELEASE Right    excision of benign skin lesion     JOINT REPLACEMENT     right knee   SKIN LESION EXCISION     thyroid lobectomy Right    WOUND DEBRIDEMENT Left 09/28/2018   Procedure: DEBRIDEMENT WOUND-SKIN,SUBCUTANEOUS TISSUE;  Surgeon: Gwyneth Revels, DPM;  Location: [facility] ORS;  Service: Podiatry;  Laterality: Left;   HPI: Pt is a 87 y.o. male with medical history significant of DM, HLD, PCD, GERD, CAD, MI, diabetic foot ulcer, dementia who presents with foot ulcer with infection.  Patient has chronic bilateral foot ulcers.  Patient has been followed up  with Dr. Ether Griffins of podiatry. Per chart notes, Pt scheduled for first ray amputation and likely debridement of the ulcer on the left heel, per podiatry.  CXR: Coarse reticular changes are present in the right lung base with  additional airways thickening. Could reflect sequela of aspiration  in the appropriate clinical setting.  The more diffusely increased interstitial markings elsewhere are  nonspecific could reflect findings of an underlying interstitial  disease.   Recent Podiatry Surgery on 05/28/2021.   Subjective: pt awake, verbal w/ tangential speech; confusion++ - baseline of Dementia. Sitter present.    Assessment / Plan / Recommendation  CHL IP CLINICAL IMPRESSIONS 05/31/2021  Clinical Impression Pt appears to present w/ Moderate-Severe Pharyngeal phase Dysphagia w/ Silent Aspiration of bolus residue AFTER the swallow from diffuse pharyngeal residue remaining post the initial swallow -- noted w/ all consistencies assessed. A much Delayed, congested cough (nonvolitional) was noted intermittently, but it did not appear effective in clearing the viewable upper airway Penetrated and Aspirated material. A delayed pharyngeal swallow initiation was present w/ all consistencies. Laryngeal Penetration and Aspiration occurred during the swallow w/ Nectar liquid trials. A Diverticulum was noted in upper Esophagus just below the UES which collected bolus material -- observed Retrograde activity of the bolus material from the Diverticulum back to the pyriform sinuses b/t trials.  Pt appears at High risk for Aspiration of po intake and impact on Pulmonary status from the Aspiration. Pt  was unable to consistently follow swallow strategies and precautions d/t Baseline Cognitive decline, Dementia. Decreased insight into his situation and risks in general. Recommend NPO status d/t the above. MD informed and will f/u w/ pt's Son to determine pt's POC; GOC.  During the Oral phase, pt demonstrated fairly adequate and  timely bolus management of all consistencies assessed. Fair+ bolus cohesion and timely A-P transfer noted w/ consistencies; min, diffuse oral residue remaining reduced/cleared b/t trials given Time for f/u, Dry swallow (nonvolitional). Min more Time needed for completing mastication of soft solid trials.  During the Pharyngeal phase, Nectar liquids spilled to the pyriform sinuses during pharyngeal swallow initiation w/ Aspiration occurring during the swallow x2/3 trials; Honey liquids triggered a more timely pharyngeal swallow at the [facility] w/ no immediate aspiration noted. Post the initial swallows, Mod+ diffuse pharyngeal residue noted throughout the pharynx, Mod in the Valleculae and Pyriform Sinuses. Pt was able to utilize a f/u, Dry swallow when given Mod+ verbal cues from SLP which reduced the residue slightly but did not clear it. Both Laryngeal Penetration w/ Aspiration was noted to occur from the pharyngeal residue after swallow/trials and impacted the next bolus attempted d/t full Valleculae and PS. Pt was unable to follow swallow strategies of Dry swallow and Cough consistently despite verbal/tactile cues. A more timely pharyngeal swallow initiation was noted w/ trials of Honey liquids and Puree/Soft solids w/ No immediate aspiration before/during the swallow.  During the Esophageal phase, a Mod-appearing Diverticulum was noted in the upper Cervical Esophagus. This impacted bolus motility through w/ Cervical Esophagus -- Retrograde backflow of material in the Cervical Esophagus to the Pyriform Sinuses occurred. This could increase risk for Aspiration of pharyngeal residue After the swallow -- it certainly appeared to increase the pharyngeal residue already remaining.   Due to pt's presentation of Aspiration and Laryngeal Penetration during and after the swallow w/ impact from Mod+ pharyngeal residue remaining w/ bolus consistencies, an oral diet is not recommended at this time as pt is at high  risk for Aspiration thus Pulmonary impact/decline and cannot follow instructions consistently to help prevent risks. Recommend NPO status w/ further discussion for GOC including any options for alternative means of feeding if appropriate w/ pt's GOC. Recommend a repeat objective swallow study when pt's medical and Pulmonary status' have improved to see if any improvement in function. Suspect pt's Baseline Dementia, Cognitive decline, will continue to have overall impact on pt's safety of oropharyngeal swallowing and ability to safely tolerate an oral diet. NSG updated on above. ST will f/u w/ education and any further needs while admitted per request.  SLP Visit Diagnosis Dysphagia, oropharyngeal phase (R13.12)  Attention and concentration deficit following --  Frontal lobe and executive function deficit following --  Impact on safety and function Severe aspiration risk;Risk for inadequate nutrition/hydration      CHL IP TREATMENT RECOMMENDATION 05/31/2021  Treatment Recommendations No treatment recommended at this time     Prognosis 05/31/2021  Prognosis for Safe Diet Advancement Guarded  Barriers to Reach Goals Cognitive deficits;Time post onset;Severity of deficits  Barriers/Prognosis Comment advanced Dementia    CHL IP DIET RECOMMENDATION 05/31/2021  SLP Diet Recommendations NPO  Liquid Administration via --  Medication Administration Via alternative means  Compensations --  Postural Changes --      CHL IP OTHER RECOMMENDATIONS 05/31/2021  Recommended Consults (No Data)  Oral Care Recommendations Oral care QID;Staff/trained caregiver to provide oral care  Other Recommendations --      CHL IP  FOLLOW UP RECOMMENDATIONS 05/31/2021  Follow up Recommendations Skilled Nursing facility      [facility] IP FREQUENCY AND DURATION 05/31/2021  Speech Therapy Frequency (ACUTE ONLY) min 1 x/week  Treatment Duration 1 week           CHL IP ORAL PHASE 05/31/2021  Oral Phase WFL  Oral - Pudding  Teaspoon --  Oral - Pudding Cup --  Oral - Honey Teaspoon --  Oral - Honey Cup --  Oral - Nectar Teaspoon --  Oral - Nectar Cup --  Oral - Nectar Straw --  Oral - Thin Teaspoon --  Oral - Thin Cup --  Oral - Thin Straw --  Oral - Puree --  Oral - Mech Soft --  Oral - Regular --  Oral - Multi-Consistency --  Oral - Pill --  Oral Phase - Comment --    CHL IP PHARYNGEAL PHASE 05/31/2021  Pharyngeal Phase Impaired  Pharyngeal- Pudding Teaspoon --  Pharyngeal --  Pharyngeal- Pudding Cup --  Pharyngeal --  Pharyngeal- Honey Teaspoon --  Pharyngeal --  Pharyngeal- Honey Cup --  Pharyngeal --  Pharyngeal- Nectar Teaspoon --  Pharyngeal --  Pharyngeal- Nectar Cup --  Pharyngeal --  Pharyngeal- Nectar Straw --  Pharyngeal --  Pharyngeal- Thin Teaspoon --  Pharyngeal --  Pharyngeal- Thin Cup --  Pharyngeal --  Pharyngeal- Thin Straw --  Pharyngeal --  Pharyngeal- Puree --  Pharyngeal --  Pharyngeal- Mechanical Soft --  Pharyngeal --  Pharyngeal- Regular --  Pharyngeal --  Pharyngeal- Multi-consistency --  Pharyngeal --  Pharyngeal- Pill --  Pharyngeal --  Pharyngeal Comment --     CHL IP CERVICAL ESOPHAGEAL PHASE 05/31/2021  Cervical Esophageal Phase Impaired  Pudding Teaspoon --  Pudding Cup --  Honey Teaspoon --  Honey Cup --  Nectar Teaspoon --  Nectar Cup --  Nectar Straw --  Thin Teaspoon --  Thin Cup --  Thin Straw --  Puree --  Mechanical Soft --  Regular --  Multi-consistency --  Pill --  Cervical Esophageal Comment --           Jerilynn Som, MS, CCC-SLP Speech Language Pathologist Rehab Services [phone] [facility] 05/31/2021, 5:53 PM

## 2021-06-01 LAB — BASIC METABOLIC PANEL
Anion gap: 7 (ref 5–15)
BUN: 26 mg/dL — ABNORMAL HIGH (ref 8–23)
CO2: 31 mmol/L (ref 22–32)
Calcium: 9.6 mg/dL (ref 8.9–10.3)
Chloride: 102 mmol/L (ref 98–111)
Creatinine, Ser: 0.73 mg/dL (ref 0.61–1.24)
GFR, Estimated: >60 mL/min (ref >60)
Glucose, Bld: 130 mg/dL — ABNORMAL HIGH (ref 70–99)
Potassium: 4 mmol/L (ref 3.5–5.1)
Sodium: 140 mmol/L (ref 135–145)

## 2021-06-01 MED ORDER — COVID-19 MRNA VACC (MODERNA) 50 MCG/0.25ML IM SUSP
0.2500 mL | Freq: Once | INTRAMUSCULAR | Status: AC
  Administered 2021-06-01: 0.25 mL via INTRAMUSCULAR
  Filled 2021-06-01: qty 0.25

## 2021-06-01 NOTE — Progress Notes (Signed)
PROGRESS NOTE    Rodney Kelly  UUV:[id number] DOB: 06-22-1924 DOA: 05/19/2021 PCP: Wilkie Aye, MD    Chief Complaint  Patient presents with   Wound Infection    Brief Narrative:   87 y.o. male with medical history significant of DM, HLD, PCD, GERD, CAD, MI, diabetic foot ulcer, dementia who presents with foot ulcer with infection, work-up significant for osteomyelitis, patient was seen by vascular surgery, no further work-up from their side, he has been followed by podiatry.  MRI significant for osteomyelitis, patient with severe hospital delirium, on 6/17 early a.m., patient with hypoxia, new oxygen requirement and chest x-ray showing aspiration in right lung base.  Hospital course complicated by severe delirium, aspiration pneumonia with dysphagia.  Assessment & Plan:   Principal Problem:   Diabetic foot infection (HCC) Active Problems:   Coronary artery disease   Type 2 diabetes mellitus with complication, without long-term current use of insulin (HCC)   Diabetic foot ulcers (HCC)   Hyperlipemia   GERD (gastroesophageal reflux disease)   Normocytic anemia   Cellulitis of right lower extremity   Right foot, cellulitis/myofasciitis with abscess,  osteomyelitis right great toe and first metatarsal bone. - Patient failed outpatient oral antibiotic doxycycline treatment.  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 Ether Griffins) - Has snf bed which will be available 6/30, will order covid test for tomorrow. Covid booster also ordered (required for snf)  Drug Rash Macular rash on back present since at least 6/21. likely drug reaction, ID agrees. Does  appear to be slowly improving - monitor  Acute metabolic encephalopathy/severe hospital delirium - improved - CT head with no acute findings - delirium precautions  Aspiration pneumonia Oropharyngeal dysphagia -Chest x-ray significant for right lung base opacity, on antibiotic coverage, seen closely by SLP, currently advanced to dysphagia 1 with nectar thick. S/p 5 days flagyl. Resolved. Per SLP remains high risk for aspiration. Discussed w/ son, no family interest in feeding tube (which I did not advise), wants to continue with current diet, aware of aspiration risk.  Sinus tachycardia -Discussed EKG finding on 6/19 with Dr. Juliann Pares  who eviewed the EKG, doubt it is A. fib, most likely sinus tachycardia with PACs versus SVT,  Hypokalemia Resolved - monitor   Coronary artery disease:  -No CP. S/p of stent per his son -ASA, tricor, Imdur  Elevated D-dimers -Lower extremity Dopplers negative for DVT, continue with Lovenox 40 mg subcu daily.  Urinary retention -Foley catheter inserted 6/17, now removed, started on Flomax.     Type 2 diabetes mellitus with complication of foot ulcer, without long-term current use of insulin (HCC): Recent A1c 6.4, well controlled.  Patient taking metformin at home - resumed home metformin - monitor daily fasting sugar   Hyperlipemia -Fenofibrate -Patient is not taking Lipitor currently   GERD (gastroesophageal reflux disease) -Protonix   Normocytic anemia:  - most likely anemia of chronic disease    Son requested patient to be transferred to [facility], previous provider have called [facility] to requested transfer on 6/19, have discussed with internist on-call Dr. Seth Bake, given there is no indication for transfer there, and all services available at our facility, they  have declined transfer especially they are at capacity, I have updated the son at  DVT prophylaxis: lovenox Code Status: Full Family Communication: son updated telephonically  6/27 Disposition: SNF  Status is: Inpatient  Remains inpatient appropriate because:IV treatments appropriate due to intensity of illness or inability to take PO  Dispo: The patient is from: Home              Anticipated d/c is to: SNF, bed will be available 6/30              Patient currently is not medically stable to d/c.   Difficult to place patient No       Consultants:  Podiatry Vascular surgery ID  Subjective:  No significant events overnight. Breathing comfortably, tolerating diet. Denies pain. No acting out   Objective: Vitals:   05/31/21 1931 06/01/21 0509 06/01/21 0751 06/01/21 1124  BP: 137/70 128/69 131/72 127/76  Pulse: 91 73 73 77  Resp: 16 16 18 18   Temp: 98.8 F (37.1 C) 97.7 F (36.5 C) 97.8 F (36.6 C) (!) 97.5 F (36.4 C)  TempSrc: Oral Oral Oral Oral  SpO2: 96% 98% 99% 98%  Weight:      Height:        Intake/Output Summary (Last 24 hours) at 06/01/2021 1253 Last data filed at 06/01/2021 1035 Gross per 24 hour  Intake 180 ml  Output 1125 ml  Net -945 ml    Filed Weights   05/19/21 0722 05/28/21 1223  Weight: 59 kg 59 kg    Examination:  Frail elderly patient, sitting in recliner,somewhat confused, calm Symmetrical Chest wall movement, rales at bases RRR,No Gallops,Rubs or new Murmurs,  +ve B.Sounds, Abd Soft, No tenderness, No rebound - guarding or rigidity. No Cyanosis, bilateral feet with Ace wrap. Macular rash on back improving   Data Reviewed: I have personally reviewed following labs and imaging studies  CBC: Recent Labs  Lab 05/26/21 0423 05/26/21 1132 05/27/21 0602 05/29/21 0424  WBC 6.1 7.2 7.4 10.1  NEUTROABS  --  5.1  --   --   HGB 9.9* 10.1* 10.9* 10.9*  HCT 31.0* 31.8* 33.7* 34.0*  MCV 87.1 89.1 88.2 88.3  PLT 346 349 358 329    Basic Metabolic Panel: Recent Labs  Lab 05/26/21 0423 05/27/21 0602 05/28/21 0457 05/29/21 0424 05/30/21 0413 05/31/21 0515 06/01/21 0733  NA 139 143  --  139 141 141 140   K 3.3* 3.7  --  3.8 3.7 4.2 4.0  CL 104 103  --  101 103 103 102  CO2 30 32  --  31 32 33* 31  GLUCOSE 130* 134* 132* 135* 112* 133* 130*  BUN 13 12  --  13 27* 30* 26*  CREATININE 0.62 0.66  --  0.54* 0.91 0.72 0.73  CALCIUM 8.8* 9.3  --  9.4 9.3 9.4 9.6  MG 1.8 2.0 1.9  --   --   --   --   PHOS 2.5  --   --   --   --   --   --     GFR: Estimated Creatinine Clearance: 47.1 mL/min (by C-G formula based on SCr of 0.73 mg/dL).  Liver Function Tests: No results for input(s): AST, ALT, ALKPHOS, BILITOT, PROT, ALBUMIN in the last 168 hours.   CBG: Recent Labs  Lab 05/28/21 1410 05/28/21 1500 05/28/21 2058 05/29/21 0744 05/29/21 2044  GLUCAP 130* 120* 131* 120* 177*     Recent Results (from the past  240 hour(s))  Aerobic/Anaerobic Culture w Gram Stain (surgical/deep wound)     Status: Abnormal (Preliminary result)   Collection Time: 05/28/21  1:36 PM   Specimen: PATH Other; Tissue  Result Value Ref Range Status   Specimen Description   Final    WOUND Performed at [facility], [address] [place], [place] [address]    Special Requests RIGHT SESAMOID BONE  Final   Gram Stain   Final    RARE WBC PRESENT,BOTH PMN AND MONONUCLEAR RARE GRAM POSITIVE COCCI RARE GRAM POSITIVE RODS Performed at [facility], [address] [place], [place] [address]    Culture (A)  Final    MULTIPLE ORGANISMS PRESENT, NONE PREDOMINANT NO GROUP A STREP (S.PYOGENES) ISOLATED NO STAPHYLOCOCCUS AUREUS ISOLATED NO ANAEROBES ISOLATED; CULTURE IN PROGRESS FOR 5 DAYS    Report Status PENDING  Incomplete         Radiology Studies: No results found.      Scheduled Meds:  vitamin C  250 mg Oral BID   aspirin  81 mg Oral Daily   Chlorhexidine Gluconate Cloth  6 each Topical Daily   COVID-19 mRNA vaccine (Moderna)  0.25 mL Intramuscular Once   doxycycline  100 mg Oral Q12H   enoxaparin (LOVENOX) injection  40 mg Subcutaneous Q24H   fenofibrate  160 mg Oral Daily    metFORMIN  1,000 mg Oral BID WC   multivitamin with minerals  1 tablet Oral Daily   mupirocin ointment   Topical Daily   pantoprazole  40 mg Oral Daily   sodium chloride flush  10 mL Intravenous Q12H   tamsulosin  0.4 mg Oral QPC supper   Continuous Infusions:      LOS: 13 days       Silvano Bilis, MD [facility]   To contact the attending provider between 7A-7P or the covering provider during after hours 7P-7A, please log into the web site [email or web address] and access using universal [facility] password for that web site. If you do not have the password, please call the hospital operator.  06/01/2021, 12:53 PM

## 2021-06-01 NOTE — TOC Progression Note (Addendum)
Transition of Care [facility]) - Progression Note    Patient Details  Name: Reis Goga MRN: [id number] Date of Birth: 09-10-1924  Transition of [facility]) CM/SW Contact  Caryn Section, RN Phone Number: 06/01/2021, 9:38 AM  Clinical Narrative:     Addendum 10:51 AM:  Son accepted bed at [facility] on Thursday.  Revonda Standard at [facility] notified.  Patient has been vaccinated against COVID x2.  Son consented to booster here.  Care team aware, will order booster.  [facility] in [place] will have a bed for patient on Thursday.  Will advise patient's son and care team.  TOC to follow    Expected Discharge Plan: Home w Home Health Services Barriers to Discharge: Continued Medical Work up  Expected Discharge Plan and Services Expected Discharge Plan: Home w Home Health Services   Discharge Planning Services: CM Consult Post Acute Care Choice: Home Health                                         Social Determinants of Health (SDOH) Interventions    Readmission Risk Interventions No flowsheet data found.

## 2021-06-01 NOTE — Evaluation (Signed)
Occupational Therapy Re-Evaluation Patient Details Name: Jibreel Fedewa MRN: [id number] DOB: Jun 05, 1924 Today's Date: 06/01/2021    History of Present Illness Per MD notes: Pt is a 87 y.o. male with medical history significant of DM, HLD, PCD, GERD, CAD, MI, diabetic foot ulcer, dementia who presents with foot ulcer with infection, work-up significant for osteomyelitis, patient was seen by vascular surgery, no further work-up from their side, he has been followed by podiatry.  MRI significant for osteomyelitis, patient with severe hospital delirium, on 6/17 early a.m., patient with hypoxia, new oxygen requirement and chest x-ray showing aspiration in right lung base.  Hospital course complicated by severe delirium causing severe dysphagia. Right foot cellulitis/myofasciitis with abscess, osteomyelitis right great toe and first metatarsal bone, acute toxic encephalopathy/hospital delirium, aspiration pneumonia, sinus tachycardia, hypokalemia, elevated D-dimers with (-) LE dopplers, and urinary retention. 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   Clinical Impression   Mr Beckerman was seen for OT re-evaluation this date following R foot partial ray amputation. Upon arrival to room pt reclined in bed, pleasant and agreeable to tx session. Pt requires MIN A sup>sit. SETUP + MIN cues self-feeding seated EOB, pt requires single UE support for fair sitting balance, intermittently MIN A to correct R progressive lateral leans. MAX A don B post op shoes seated EOB. MOD A x2 f+ RW for w/c t/f - overlapping with PT for safe mobility. Left in w/c with PT at end of session. Pt making good progress toward goals. Pt continues to benefit from skilled OT services to maximize return to PLOF and minimize risk of future falls, injury, caregiver burden, and readmission. Will continue to follow POC. Discharge recommendation remains appropriate.       Follow Up Recommendations  SNF    Equipment Recommendations  3 in 1 bedside commode    Recommendations for Other Services       Precautions / Restrictions Precautions Precautions: Fall Restrictions Weight Bearing Restrictions: Yes RLE Weight Bearing: Non weight bearing (ideally NWB but podiatry cleared pt for WB through R heel for transfers only, in post op shoe) LLE Weight Bearing: Weight bearing as tolerated      Mobility Bed Mobility Overal bed mobility: Needs Assistance Bed Mobility: Supine to Sit     Supine to sit: Min assist;HOB elevated     General bed mobility comments: cues and light physical assist    Transfers Overall transfer level: Needs assistance Equipment used: Rolling walker (2 wheeled) Transfers: Sit to/from [employer] Sit to Stand: Mod assist;+2 physical assistance;From elevated surface Stand pivot transfers: Mod assist;+2 physical assistance;From elevated surface            Balance Overall balance assessment: Needs assistance Sitting-balance support: Single extremity supported;Feet supported Sitting balance-Leahy Scale: Fair     Standing balance support: Bilateral upper extremity supported;During functional activity Standing balance-Leahy Scale: Poor                             ADL either performed or assessed with clinical judgement   ADL Overall ADL's : Needs assistance/impaired                                       General ADL Comments: SETUP + MIN cues self-feeding seated EOB, pt requires single UE support  for fair sitting balance, intermittently MIN A to correct R progressive lateral leans. MAX A don B shoes seated EOB. MOD A x2 for ADL t/f      Pertinent Vitals/Pain Pain Assessment: No/denies pain        Extremity/Trunk Assessment Upper Extremity Assessment Upper Extremity Assessment: Generalized weakness   Lower Extremity Assessment Lower Extremity Assessment: Generalized  weakness       Communication     Cognition Arousal/Alertness: Awake/alert Behavior During Therapy: Flat affect Overall Cognitive Status: History of cognitive impairments - at baseline                                 General Comments: Oriented to self only,pleasant, requires redirection   General Comments       Exercises Exercises: Other exercises Other Exercises Other Exercises: Pt educated re: DME recs, d/c recs, falls prevention, ECS, WBing pcns Other Exercises: self-feeding, LBD, sit<>stand, SPT, sitting/standing balance/tolerance                  OT Goals(Current goals can be found in the care plan section) Acute Rehab OT Goals Patient Stated Goal: none stated today OT Goal Formulation: With patient Time For Goal Achievement: 06/15/21 Potential to Achieve Goals: Fair ADL Goals Pt Will Perform Eating: with set-up;with supervision;sitting Pt Will Perform Grooming: with min assist;sitting Pt Will Perform Upper Body Dressing: with supervision;sitting Pt Will Transfer to Toilet: with min assist;stand pivot transfer;bedside commode  OT Frequency: Min 1X/week    AM-PAC OT "6 Clicks" Daily Activity     Outcome Measure Help from another person eating meals?: A Little Help from another person taking care of personal grooming?: A Little Help from another person toileting, which includes using toliet, bedpan, or urinal?: A Lot Help from another person bathing (including washing, rinsing, drying)?: A Lot Help from another person to put on and taking off regular upper body clothing?: A Lot Help from another person to put on and taking off regular lower body clothing?: A Lot 6 Click Score: 14   End of Session Equipment Utilized During Treatment: Rolling walker  Activity Tolerance: Patient tolerated treatment well Patient left: Other (comment) (in w/c with PT)  OT Visit Diagnosis: Unsteadiness on feet (R26.81);Muscle weakness (generalized) (M62.81)                 Time: 2683-4196 OT Time Calculation (min): 14 min Charges:  OT General Charges $OT Visit: 1 Visit OT Evaluation $OT Re-eval: 1 Re-eval OT Treatments $Self Care/Home Management : 8-22 mins   Kathie Dike, M.S. OTR/L  06/01/21, 10:55 AM  ascom [phone]

## 2021-06-01 NOTE — Progress Notes (Signed)
Physical Therapy Treatment Patient Details Name: Rodney Kelly MRN: [id number] DOB: Dec 22, 1923 Today's Date: 06/01/2021    History of Present Illness Per MD notes: Pt is a 87 y.o. male with medical history significant of DM, HLD, PCD, GERD, CAD, MI, diabetic foot ulcer, dementia who presents with foot ulcer with infection, work-up significant for osteomyelitis, patient was seen by vascular surgery, no further work-up from their side, he has been followed by podiatry.  MRI significant for osteomyelitis, patient with severe hospital delirium, on 6/17 early a.m., patient with hypoxia, new oxygen requirement and chest x-ray showing aspiration in right lung base.  Hospital course complicated by severe delirium causing severe dysphagia. Right foot cellulitis/myofasciitis with abscess, osteomyelitis right great toe and first metatarsal bone, acute toxic encephalopathy/hospital delirium, aspiration pneumonia, sinus tachycardia, hypokalemia, elevated D-dimers with (-) LE dopplers, and urinary retention. 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    PT Comments    Pt was just finishing up OT upon arriving. Pt was seated EOB. Baseline cognition deficits noted. Cognition greatly impacts session progression. He was able to stand to RW with BLE post op shoes. Poor ability to limit wt on RLE. +2 assist to w/c however max of one to stand pivot back to bed from w/c. Did self propel w/c some however required constant cueing and redirecting to stay focus on task. Pt will require SNF at [place] to address deficits while improving independence with ADLs prior to returning home.     Follow Up Recommendations  SNF     Equipment Recommendations  Wheelchair cushion (measurements PT);Wheelchair (measurements PT);Rolling walker with 5" wheels       Precautions / Restrictions Precautions Precautions: Fall Required Braces or Orthoses: Other  Brace Other Brace: BLE post op shoes Restrictions Weight Bearing Restrictions: Yes RLE Weight Bearing: Non weight bearing LLE Weight Bearing: Weight bearing as tolerated (post op shoe)    Mobility  Bed Mobility Overal bed mobility: Needs Assistance Bed Mobility: Supine to Sit     Supine to sit: Min assist;HOB elevated     General bed mobility comments: pt was seated EOB with OT upon arriving    Transfers Overall transfer level: Needs assistance Equipment used: Rolling walker (2 wheeled) Transfers: Sit to/from [employer] Sit to Stand: Mod assist;+2 physical assistance;From elevated surface;Max assist Stand pivot transfers: Max assist       General transfer comment: +2 for EOB to w/c however after w/c training was able to stand pivot back to bed from w/c with max assist of one.  Ambulation/Gait    Gait velocity: decreased   General Gait Details: did not ambulate 2/2 to poor ability to maintain proper wt bearing. this session focused on w/c mobility     Balance Overall balance assessment: Needs assistance Sitting-balance support: Single extremity supported;Feet supported Sitting balance-Leahy Scale: Fair Sitting balance - Comments: supervision static sitting EOB   Standing balance support: Bilateral upper extremity supported;During functional activity Standing balance-Leahy Scale: Poor Standing balance comment: extremely high fall risk. Requires constant assistance in standing throughout.       Cognition Arousal/Alertness: Awake/alert Behavior During Therapy: WFL for tasks assessed/performed Overall Cognitive Status: History of cognitive impairments - at baseline        General Comments: Oriented to self only,pleasant, requires redirection      Exercises Other Exercises Other Exercises: Pt educated re: DME recs, d/c recs, falls prevention, ECS, WBing pcns Other Exercises: self-feeding,  LBD, sit<>stand, SPT, sitting/standing  balance/tolerance        Pertinent Vitals/Pain Pain Assessment: No/denies pain    Home Living Family/patient expects to be discharged to:: Private residence              PT Goals (current goals can now be found in the care plan section) Acute Rehab PT Goals Patient Stated Goal: none stated today Progress towards PT goals: Progressing toward goals    Frequency    7X/week      PT Plan Current plan remains appropriate       AM-PAC PT "6 Clicks" Mobility   Outcome Measure  Help needed turning from your back to your side while in a flat bed without using bedrails?: A Little Help needed moving from lying on your back to sitting on the side of a flat bed without using bedrails?: A Lot Help needed moving to and from a bed to a chair (including a wheelchair)?: A Lot Help needed standing up from a chair using your arms (e.g., wheelchair or bedside chair)?: A Lot Help needed to walk in hospital room?: Total Help needed climbing 3-5 steps with a railing? : Total 6 Click Score: 11    End of Session Equipment Utilized During Treatment: Gait belt (BLE post op shoe) Activity Tolerance: Patient tolerated treatment well;Other (comment) (limited by poor cognition) Patient left: in bed;with call bell/phone within reach;with family/visitor present;with nursing/sitter in room;with bed alarm set Nurse Communication: Mobility status PT Visit Diagnosis: Unsteadiness on feet (R26.81);Difficulty in walking, not elsewhere classified (R26.2);Muscle weakness (generalized) (M62.81)     Time: 1020-1040 PT Time Calculation (min) (ACUTE ONLY): 20 min  Charges:  $Wheel Chair Management: 8-22 mins          Jetta Lout PTA 06/01/21, 12:26 PM

## 2021-06-02 DIAGNOSIS — E43 Unspecified severe protein-calorie malnutrition: Secondary | ICD-10-CM | POA: Insufficient documentation

## 2021-06-02 LAB — RESP PANEL BY RT-PCR (FLU A&B, COVID) ARPGX2
Influenza A by PCR: NEGATIVE
Influenza B by PCR: NEGATIVE
SARS Coronavirus 2 by RT PCR: NEGATIVE

## 2021-06-02 LAB — GLUCOSE, RANDOM: Glucose, Bld: 139 mg/dL — ABNORMAL HIGH (ref 70–99)

## 2021-06-02 LAB — SURGICAL PATHOLOGY

## 2021-06-02 NOTE — Hospital Course (Addendum)
"  87 y.o. male with medical history significant of DM, HLD, PCD, GERD, CAD, MI, diabetic foot ulcer, dementia who presents with foot ulcer with infection, work-up significant for osteomyelitis, patient was seen by vascular surgery, no further work-up from their side, he has been followed by podiatry.  MRI significant for osteomyelitis, patient with severe hospital delirium, on 6/17 early a.m., patient with hypoxia, new oxygen requirement and chest x-ray showing aspiration in right lung base.  Hospital course complicated by severe delirium, aspiration pneumonia with dysphagia."  Patient has been medically stable and delirium resolved now for several days.  Ready for discharge to SNF / rehab today.

## 2021-06-02 NOTE — Progress Notes (Signed)
Nutrition Follow-up  DOCUMENTATION CODES:  Severe malnutrition in context of chronic illness  INTERVENTION:  Continue current diet as ordered per SLP recommendations.  Magic cup TID with meals, each supplement provides 290 kcal and 9 grams of protein Honey Thick Mighty Shake 1x/d, each supplement provides 200kcal and 7g protein  Request measured weight  NUTRITION DIAGNOSIS:  Severe Malnutrition (in the context of chronic illness) related to poor appetite as evidenced by severe fat depletion, severe muscle depletion.  GOAL:  Patient will meet greater than or equal to 90% of their needs  MONITOR:  PO intake, Supplement acceptance, Labs, Weight trends  REASON FOR ASSESSMENT:  Diagnosis, LOS (wounds)    ASSESSMENT:  87 y.o. male presented to ER at the advice of his podiatrist for evaluation of worsening right foot wound that has developed worsening erythema, and drainage. Completed a course of antibiotics outpatient without any improvement. PMH includes CAD, DM, GERD, DM foot ulcers, HLD, Hx MI, osteoporosis, vitamin d deficiency and dementia.  Pt with ulcers to the bilateral feet, right worse than left. Evaluation of right foot concerning for osteomyelitis. 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  Pt noted to have an aspiration event 6/16 and was made NPO. SLP advanced to DYS 1 with honey thick liquids on 6/20. MBS completed 6/27 showed silent aspiration of all consistencies tested. Recommendation was made for pt to be NPO, family opted to allow pt to continue current diet as ordered assuming aspiration risk.   Pt sleeping at the time of visit. Did not wake when spoken to or during physical exam. Lunch tray at bedside untouched. NT at bedside reports that he only ate a few bites of breakfast and she was able to get him to do a couple sips of mighty shake. Will continue to monitor.   Per TOC notes, pt will likely be discharged tomorrow 6/30.    Diet Order Hx: 6/15: Heart healthy/carb  modified 6/16-6/19: NPO 6/20-6/23: DYS 1 / honey thick liquids 6/24: NPO for procedure  6/25-present: DYS 1 / honey thick liquids  Average Meal Intake: 6/15-6/24: 65% intake x 5 recorded meals (50-100%) 6/25-6/29: 58% x 10 recorded meals (15-100%)  Nutritionally Relevant Medications: Scheduled Meds:  vitamin C  250 mg Oral BID   doxycycline  100 mg Oral Q12H   fenofibrate  160 mg Oral Daily   metFORMIN  1,000 mg Oral BID WC   multivitamin with minerals  1 tablet Oral Daily   pantoprazole  40 mg Oral Daily   PRN Meds: diphenhydrAMINE, ondansetron   Labs Reviewed BUN 26  NUTRITION - FOCUSED PHYSICAL EXAM: Flowsheet Row Most Recent Value  Orbital Region Severe depletion  Upper Arm Region Severe depletion  Thoracic and Lumbar Region Severe depletion  Buccal Region Severe depletion  Temple Region Severe depletion  Clavicle Bone Region Moderate depletion  Clavicle and Acromion Bone Region Severe depletion  Scapular Bone Region Severe depletion  Dorsal Hand Severe depletion  Patellar Region Severe depletion  Anterior Thigh Region Severe depletion  Posterior Calf Region Severe depletion  Edema (RD Assessment) Mild  Hair Reviewed  Eyes Unable to assess  Mouth Unable to assess  Skin Reviewed  Nails Reviewed   Diet Order:   Diet Order             DIET - DYS 1 Room service appropriate? Yes with Assist; Fluid consistency: Honey Thick  Diet effective now  EDUCATION NEEDS:  No education needs have been identified at this time  Skin:  Skin Assessment: Skin Integrity Issues: Skin Integrity Issues:: Incisions, Diabetic Ulcer Diabetic Ulcer: left foot Incisions: right foot  Last BM:  6/29 - type 7  Height:  Ht Readings from Last 1 Encounters:  05/28/21 5\' 9"  (1.753 m)   Weight:  Wt Readings from Last 1 Encounters:  05/28/21 59 kg   Ideal Body Weight:  72.7 kg  BMI:  Body mass index is 19.21 kg/m.  Estimated Nutritional Needs: Kcal:   1600-1800 kcal/d Protein:  90-100 g/d Fluid:  >1800 mL/d   05/30/21, RD, LDN Clinical Dietitian Pager on [employer]

## 2021-06-02 NOTE — Plan of Care (Signed)

## 2021-06-02 NOTE — Progress Notes (Signed)
Occupational Therapy Treatment Patient Details Name: Rodney Kelly MRN: [id number] DOB: 01-01-1924 Today's Date: 06/02/2021    History of present illness Per MD notes: Pt is a 87 y.o. male with medical history significant of DM, HLD, PCD, GERD, CAD, MI, diabetic foot ulcer, dementia who presents with foot ulcer with infection, work-up significant for osteomyelitis, patient was seen by vascular surgery, no further work-up from their side, he has been followed by podiatry.  MRI significant for osteomyelitis, patient with severe hospital delirium, on 6/17 early a.m., patient with hypoxia, new oxygen requirement and chest x-ray showing aspiration in right lung base.  Hospital course complicated by severe delirium causing severe dysphagia. Right foot cellulitis/myofasciitis with abscess, osteomyelitis right great toe and first metatarsal bone, acute toxic encephalopathy/hospital delirium, aspiration pneumonia, sinus tachycardia, hypokalemia, elevated D-dimers with (-) LE dopplers, and urinary retention. 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   OT comments  Chart reviewed, pt greeted in bed with safety sitter present. Pt agreeable to tx session, pleasant and cooperative throughout tx session. Pt performs washing face in bed with SET UP. Pt performs supine>seated at edge of bed with MOD A x2. He performs STS with MOD A +2, 3x, SPT to [facility] with MOD Ax2 with RW. Step by step verbal and tactile cues required for adherence to RUE NWBing precautions. Pt requires MOD A for UB dressing, MAX A for peri care. Pt returns to bed via SPT with MOD A +2. Verbal cues required throughout for sequencing of tasks, safety, adherence to precautions. Overall poor adherence to precautions throughout. Pt is left as received, NAD, all needs met. Safety sitter present at completion of session. Continue OT per POC.    Follow Up Recommendations  SNF     Equipment Recommendations  3 in 1 bedside commode    Recommendations for Other Services      Precautions / Restrictions Precautions Precautions: Fall Required Braces or Orthoses: Other Brace Other Brace: BLE post op shoes Restrictions Weight Bearing Restrictions: Yes RLE Weight Bearing: Non weight bearing LLE Weight Bearing: Weight bearing as tolerated Other Position/Activity Restrictions: BLE post op shoes       Mobility Bed Mobility Overal bed mobility: Needs Assistance Bed Mobility: Supine to Sit;Sit to Supine     Supine to sit: Min assist;HOB elevated Sit to supine: Min assist;HOB elevated        Transfers Overall transfer level: Needs assistance Equipment used: Rolling walker (2 wheeled) Transfers: Sit to/from Stand Sit to Stand: Mod assist;+2 physical assistance Stand pivot transfers: Mod assist            Balance Overall balance assessment: Needs assistance Sitting-balance support: No upper extremity supported Sitting balance-Leahy Scale: Fair Sitting balance - Comments: dyanamic sitting balance at EOB while washing face Postural control: Posterior lean Standing balance support: Bilateral upper extremity supported;During functional activity Standing balance-Leahy Scale: Fair                             ADL either performed or assessed with clinical judgement   ADL Overall ADL's : Needs assistance/impaired     Grooming: Wash/dry face;Set up Grooming Details (indicate cue type and reason): bed level, no vcs required for sequencing         Upper Body Dressing : Moderate assistance   Lower Body Dressing: Maximal assistance Lower Body Dressing Details (indicate cue type and reason):  socks, BLE post op shoes; intermittent vcs required throughout Toilet Transfer: Moderate assistance;+2 for physical assistance;Cueing for safety;Cueing for sequencing;RW;BSC Toilet Transfer Details (indicate cue type and reason): verbal and tactile cueing  required for approrpiate adherence to WB precuations Toileting- Clothing Manipulation and Hygiene: Maximal assistance Toileting - Clothing Manipulation Details (indicate cue type and reason): for peri care       General ADL Comments: SET UP for washing face, MAX A for LB dressing, MOD A for UB dressing, SPT with RW from edge of bed to [facility] with MOD A x2, frequent vcs for adherence to WB precautions     Vision       Perception     Praxis      Cognition Arousal/Alertness: Awake/alert Behavior During Therapy: WFL for tasks assessed/performed Overall Cognitive Status: History of cognitive impairments - at baseline                                 General Comments: Oriented to self, pleasant and cooperative        Exercises     Shoulder Instructions       General Comments      Pertinent Vitals/ Pain       Pain Assessment: No/denies pain  Home Living                                          Prior Functioning/Environment              Frequency  Min 1X/week        Progress Toward Goals  OT Goals(current goals can now be found in the care plan section)  Progress towards OT goals: Progressing toward goals  Acute Rehab OT Goals Patient Stated Goal: none stated today Time For Goal Achievement: 06/15/21 Potential to Achieve Goals: [place] Discharge plan remains appropriate;Frequency remains appropriate    Co-evaluation                 AM-PAC OT "6 Clicks" Daily Activity     Outcome Measure   Help from another person eating meals?: A Little Help from another person taking care of personal grooming?: A Little Help from another person toileting, which includes using toliet, bedpan, or urinal?: A Lot Help from another person bathing (including washing, rinsing, drying)?: A Lot Help from another person to put on and taking off regular upper body clothing?: A Lot Help from another person to put on and taking off regular  lower body clothing?: A Lot 6 Click Score: 14    End of Session Equipment Utilized During Treatment: Gait belt;Rolling walker  OT Visit Diagnosis: Unsteadiness on feet (R26.81);Muscle weakness (generalized) (M62.81)   Activity Tolerance Patient tolerated treatment well   Patient Left in bed;with bed alarm set;with nursing/sitter in room   Nurse Communication          Time: 1520-1550 OT Time Calculation (min): 30 min  Charges: OT General Charges $OT Visit: 1 Visit OT Treatments $Self Care/Home Management : 23-37 mins  Shanon Payor, OTD OTR/L  06/02/21, 4:17 PM

## 2021-06-02 NOTE — Progress Notes (Signed)
PROGRESS NOTE    Rodney Kelly   ZOX:[id number]  DOB: [birth year]  PCP: Wilkie Aye, MD    DOA: 05/19/2021 LOS: 14   Assessment & Plan   Principal Problem:   Diabetic foot infection (HCC) Active Problems:   Coronary artery disease   Type 2 diabetes mellitus with complication, without long-term current use of insulin (HCC)   Diabetic foot ulcers (HCC)   Hyperlipemia   GERD (gastroesophageal reflux disease)   Normocytic anemia   Cellulitis of right lower extremity   Protein-calorie malnutrition, severe     Right foot, cellulitis/myofasciitis with abscess,  osteomyelitis right great toe and first metatarsal bone -  Failed outpatient PO doxycycline treatment. 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 Ether Griffins) -- Has SNF bed available 6/30, - covid test ordered. Covid booster also ordered (required for SNF)   Drug Rash - Macular rash on back present since at least 6/21. likely drug reaction, ID agrees. Slowly improving --monitor   Acute metabolic encephalopathy/severe hospital delirium - Iimproved.  Waxes and wanes. Sitter present this AM 6/29. CT head with no acute findings. --Delirium precautions:     -Lights and TV off, minimize interruptions at night    -Blinds open and lights on during day    -Glasses/hearing aid with patient    -Frequent reorientation    -PT/OT when able    -Avoid sedation medications/Beers list medications   Aspiration pneumonia Oropharyngeal dysphagia Chest x-ray significant for right lung base opacity, on antibiotic coverage, seen closely by SLP, currently advanced to dysphagia 1 with nectar thick. S/p 5 days flagyl. Resolved. Per SLP  remains high risk for aspiration. Discussed w/ son, no family interest in feeding tube (we advise against). --Continue current diet per SLP rec --Aspiration precautions --Oral hygiene  Sinus tachycardia -  Prior attending discussed EKG finding on 6/19 with Dr. Juliann Pares  who reviewed the EKG, doubt it is A. fib, Most likely sinus tachycardia with PACs versus SVT. -- Telemetry   Hypokalemia -replaced and resolved. - monitor and replace as needed   Coronary artery disease -stable, no active chest pain. S/p of stent per his son.   --Continue ASA, Tricor, Imdur   Elevated D-dimer - Lower extremity Dopplers negative for DVT, continue with Lovenox 40 mg subcu daily for prophylaxis.   Urinary retention - Foley catheter inserted 6/17, since has been removed, started on Flomax.   --Continue Flomax   Type 2 diabetes mellitus with complication of foot ulcer, without long-term current use of insulin -  Recent A1c 6.4, well controlled.  On metformin at home --Resumed home metformin --Monitor daily fasting sugar   Hyperlipemia - Fenofibrate.  Pt not taking Lipitor.    GERD - on Protonix   Normocytic anemia - most likely anemia of chronic disease      Son requested patient to be transferred to [facility], previous provider have called [facility] to requested transfer on 6/19, have discussed with internist on-call Dr. Seth Bake, given there is no indication for transfer there, and all services available at our facility, they have declined transfer especially they are at capacity, I have updated the son at  Patient BMI: Body mass index is 19.21 kg/m.  DVT prophylaxis: enoxaparin (LOVENOX) injection 40 mg Start: 05/23/21 2100   Diet:  Diet Orders (From admission, onward)     Start     Ordered   05/29/21 1218  DIET - DYS 1 Room service appropriate? Yes with Assist; Fluid consistency: Honey Thick  Diet effective now       Comments: Extra Gravy on meats, potatoes. Yogurt and puddings. Magic Cup.  Oatmeal per Speech ok.  Question Answer Comment  Room service appropriate? Yes with Assist   Fluid consistency: Honey Thick      05/29/21 1218              Code Status: Full Code   Brief Narrative / Hospital Course to Date:   "87 y.o. male with medical history significant of DM, HLD, PCD, GERD, CAD, MI, diabetic foot ulcer, dementia who presents with foot ulcer with infection, work-up significant for osteomyelitis, patient was seen by vascular surgery, no further work-up from their side, he has been followed by podiatry.  MRI significant for osteomyelitis, patient with severe hospital delirium, on 6/17 early a.m., patient with hypoxia, new oxygen requirement and chest x-ray showing aspiration in right lung base.  Hospital course complicated by severe delirium, aspiration pneumonia with dysphagia."    Subjective 06/02/21    Patient somnolent when seen this morning.  Sitter at bedside reports patient quite restless and trying to get out of bed earlier.  Patient wakes briefly denies pain or shortness of breath or other acute complaints.   Disposition Plan & Communication   Status is: Inpatient  Remains inpatient appropriate because: Awaiting SNF placement; ongoing delirium  Dispo: The patient is from: Home              Anticipated d/c is to: SNF              Patient currently is medically stable to d/c.   Difficult to place patient No    Consults, Procedures, Significant Events   Consultants:  Podiatry Vascular surgery Infectious disease  Procedures:  Right foot first ray amputation 6/24  Antimicrobials:  Anti-infectives (From admission, onward)    Start     Dose/Rate Route Frequency Ordered Stop   05/30/21 2200  doxycycline (VIBRA-TABS) tablet 100 mg        100 mg Oral Every 12 hours 05/30/21 1041     05/30/21 2000  vancomycin (VANCOREADY) IVPB 750 mg/150 mL  Status:  Discontinued        750 mg 150 mL/hr over 60 Minutes Intravenous Every 24 hours 05/30/21 0924  05/30/21 1040   05/29/21 1800  vancomycin (VANCOCIN) IVPB 1000 mg/200 mL premix  Status:  Discontinued        1,000 mg 200 mL/hr over 60 Minutes Intravenous Every 24 hours 05/28/21 1247 05/30/21 0924   05/28/21 1800  vancomycin (VANCOREADY) IVPB 1250 mg/250 mL        1,250 mg 166.7 mL/hr over 90 Minutes Intravenous  Once 05/28/21 1247 05/28/21 1942   05/28/21 1419  vancomycin (VANCOCIN) powder  Status:  Discontinued          As needed 05/28/21 1419 05/28/21 1420   05/24/21 1400  ceFAZolin (ANCEF) IVPB 2g/100 mL premix  Status:  Discontinued        2 g 200 mL/hr over 30 Minutes Intravenous Every 8 hours 05/24/21 1116 05/26/21 1037   05/21/21 1300  vancomycin (VANCOCIN) IVPB 1000 mg/200 mL premix  Status:  Discontinued        1,000 mg 200  mL/hr over 60 Minutes Intravenous Every 24 hours 05/21/21 1238 05/21/21 1620   05/21/21 1030  ceFEPIme (MAXIPIME) 2 g in sodium chloride 0.9 % 100 mL IVPB  Status:  Discontinued        2 g 200 mL/hr over 30 Minutes Intravenous Every 12 hours 05/21/21 0932 05/24/21 1116   05/21/21 1000  metroNIDAZOLE (FLAGYL) IVPB 500 mg        500 mg 100 mL/hr over 60 Minutes Intravenous Every 8 hours 05/21/21 0914 05/25/21 1825   05/20/21 1100  vancomycin (VANCOCIN) IVPB 1000 mg/200 mL premix  Status:  Discontinued        1,000 mg 200 mL/hr over 60 Minutes Intravenous Every 24 hours 05/19/21 1033 05/21/21 1238   05/19/21 0930  vancomycin (VANCOREADY) IVPB 1250 mg/250 mL        1,250 mg 166.7 mL/hr over 90 Minutes Intravenous  Once 05/19/21 0845 05/19/21 1231   05/19/21 0900  aztreonam (AZACTAM) 2 g in sodium chloride 0.9 % 100 mL IVPB        2 g 200 mL/hr over 30 Minutes Intravenous  Once 05/19/21 0845 05/19/21 1037         Micro    Objective   Vitals:   06/01/21 1124 06/01/21 1552 06/01/21 1941 06/02/21 1611  BP: 127/76 130/63 140/83 116/61  Pulse: 77 92 89 82  Resp: 18 17 18 16   Temp: (!) 97.5 F (36.4 C) 97.9 F (36.6 C) 98.2 F (36.8 C) 97.6 F  (36.4 C)  TempSrc: Oral Oral Oral Oral  SpO2: 98% 96% 98% (!) 77%  Weight:      Height:        Intake/Output Summary (Last 24 hours) at 06/02/2021 1739 Last data filed at 06/02/2021 1024 Gross per 24 hour  Intake 0 ml  Output 225 ml  Net -225 ml   Filed Weights   05/19/21 0722 05/28/21 1223  Weight: 59 kg 59 kg    Physical Exam:  General exam: Somnolent, wakes briefly to voice, no acute distress HEENT: Dry mucus membranes, hearing grossly normal  Respiratory system: CTAB anteriorly, no wheezes, rales or rhonchi, normal respiratory effort. Cardiovascular system: normal S1/S2, RRR, no pedal edema.   Gastrointestinal system: soft, NT, ND, no HSM felt, +bowel sounds. Central nervous system: no gross focal neurologic deficits, normal speech Skin: dry, intact, normal temperature  Labs   Data Reviewed: I have personally reviewed following labs and imaging studies  CBC: Recent Labs  Lab 05/27/21 0602 05/29/21 0424  WBC 7.4 10.1  HGB 10.9* 10.9*  HCT 33.7* 34.0*  MCV 88.2 88.3  PLT 358 329   Basic Metabolic Panel: Recent Labs  Lab 05/27/21 0602 05/28/21 0457 05/29/21 0424 05/30/21 0413 05/31/21 0515 06/01/21 0733 06/02/21 0627  NA 143  --  139 141 141 140  --   K 3.7  --  3.8 3.7 4.2 4.0  --   CL 103  --  101 103 103 102  --   CO2 32  --  31 32 33* 31  --   GLUCOSE 134* 132* 135* 112* 133* 130* 139*  BUN 12  --  13 27* 30* 26*  --   CREATININE 0.66  --  0.54* 0.91 0.72 0.73  --   CALCIUM 9.3  --  9.4 9.3 9.4 9.6  --   MG 2.0 1.9  --   --   --   --   --    GFR: Estimated Creatinine Clearance: 47.1 mL/min (by  C-G formula based on SCr of 0.73 mg/dL). Liver Function Tests: No results for input(s): AST, ALT, ALKPHOS, BILITOT, PROT, ALBUMIN in the last 168 hours. No results for input(s): LIPASE, AMYLASE in the last 168 hours. No results for input(s): AMMONIA in the last 168 hours. Coagulation Profile: No results for input(s): INR, PROTIME in the last 168  hours. Cardiac Enzymes: No results for input(s): CKTOTAL, CKMB, CKMBINDEX, TROPONINI in the last 168 hours. BNP (last 3 results) No results for input(s): PROBNP in the last 8760 hours. HbA1C: No results for input(s): HGBA1C in the last 72 hours. CBG: Recent Labs  Lab 05/28/21 1410 05/28/21 1500 05/28/21 2058 05/29/21 0744 05/29/21 2044  GLUCAP 130* 120* 131* 120* 177*   Lipid Profile: No results for input(s): CHOL, HDL, LDLCALC, TRIG, CHOLHDL, LDLDIRECT in the last 72 hours. Thyroid Function Tests: No results for input(s): TSH, T4TOTAL, FREET4, T3FREE, THYROIDAB in the last 72 hours. Anemia Panel: No results for input(s): VITAMINB12, FOLATE, FERRITIN, TIBC, IRON, RETICCTPCT in the last 72 hours. Sepsis Labs: No results for input(s): PROCALCITON, LATICACIDVEN in the last 168 hours.  Recent Results (from the past 240 hour(s))  Aerobic/Anaerobic Culture w Gram Stain (surgical/deep wound)     Status: Abnormal (Preliminary result)   Collection Time: 05/28/21  1:36 PM   Specimen: PATH Other; Tissue  Result Value Ref Range Status   Specimen Description   Final    WOUND Performed at [facility], [address], [place], [place] [address]    Special Requests RIGHT SESAMOID BONE  Final   Gram Stain   Final    RARE WBC PRESENT,BOTH PMN AND MONONUCLEAR RARE GRAM POSITIVE COCCI RARE GRAM POSITIVE RODS Performed at [facility], [address] [place], [place] [address]    Culture (A)  Final    MULTIPLE ORGANISMS PRESENT, NONE PREDOMINANT NO GROUP A STREP (S.PYOGENES) ISOLATED NO STAPHYLOCOCCUS AUREUS ISOLATED NO ANAEROBES ISOLATED; CULTURE IN PROGRESS FOR 5 DAYS    Report Status PENDING  Incomplete  Resp Panel by RT-PCR (Flu A&B, Covid) Nasopharyngeal Swab     Status: None   Collection Time: 06/02/21  6:17 AM   Specimen: Nasopharyngeal Swab; Nasopharyngeal(NP) swabs in vial transport medium  Result Value Ref Range Status   SARS Coronavirus 2 by RT PCR  NEGATIVE NEGATIVE Final    Comment: (NOTE) SARS-CoV-2 target nucleic acids are NOT DETECTED.  The SARS-CoV-2 RNA is generally detectable in upper respiratory specimens during the acute phase of infection. The lowest concentration of SARS-CoV-2 viral copies this assay can detect is 138 copies/mL. A negative result does not preclude SARS-Cov-2 infection and should not be used as the sole basis for treatment or other patient management decisions. A negative result may occur with  improper specimen collection/handling, submission of specimen other than nasopharyngeal swab, presence of viral mutation(s) within the areas targeted by this assay, and inadequate number of viral copies(<138 copies/mL). A negative result must be combined with clinical observations, patient history, and epidemiological information. The expected result is Negative.  Fact Sheet for Patients:  BloggerCourse.com  Fact Sheet for Healthcare Providers:  SeriousBroker.it  This test is no t yet approved or cleared by the [place] FDA and  has been authorized for detection and/or diagnosis of SARS-CoV-2 by FDA under an Emergency Use Authorization (EUA). This EUA will remain  in effect (meaning this test can be used) for the duration of the COVID-19 declaration under Section 564(b)(1) of the Act, 21 U.S.C.section 360bbb-3(b)(1), unless the authorization  is terminated  or revoked sooner.       Influenza A by PCR NEGATIVE NEGATIVE Final   Influenza B by PCR NEGATIVE NEGATIVE Final    Comment: (NOTE) The Xpert Xpress SARS-CoV-2/FLU/RSV plus assay is intended as an aid in the diagnosis of influenza from Nasopharyngeal swab specimens and should not be used as a sole basis for treatment. Nasal washings and aspirates are unacceptable for Xpert Xpress SARS-CoV-2/FLU/RSV testing.  Fact Sheet for Patients: BloggerCourse.comhttps://www.fda.gov/media/152166/download  Fact Sheet for  Healthcare Providers: SeriousBroker.ithttps://www.fda.gov/media/152162/download  This test is not yet approved or cleared by the [place] FDA and has been authorized for detection and/or diagnosis of SARS-CoV-2 by FDA under an Emergency Use Authorization (EUA). This EUA will remain in effect (meaning this test can be used) for the duration of the COVID-19 declaration under Section 564(b)(1) of the Act, 21 U.S.C. section 360bbb-3(b)(1), unless the authorization is terminated or revoked.  Performed at [facility], [address] [place], [place] [address]       Imaging Studies   No results found.   Medications   Scheduled Meds:  vitamin C  250 mg Oral BID   aspirin  81 mg Oral Daily   Chlorhexidine Gluconate Cloth  6 each Topical Daily   doxycycline  100 mg Oral Q12H   enoxaparin (LOVENOX) injection  40 mg Subcutaneous Q24H   fenofibrate  160 mg Oral Daily   metFORMIN  1,000 mg Oral BID WC   multivitamin with minerals  1 tablet Oral Daily   mupirocin ointment   Topical Daily   pantoprazole  40 mg Oral Daily   sodium chloride flush  10 mL Intravenous Q12H   tamsulosin  0.4 mg Oral QPC supper   Continuous Infusions:     LOS: 14 days    Time spent: 30 minutes    Pennie BanterKelly A Lashonta Pilling, DO [facility]  06/02/2021, 5:39 PM      If 7PM-7AM, please contact night-coverage. How to contact the [facility] Attending or Consulting provider 7A - 7P or covering provider during after hours 7P -7A, for this patient?    Check the care team in [facility] and look for a) attending/consulting [facility] provider listed and b) the [facility] team listed Log into [email or web address] and use [place]'s universal password to access. If you do not have the password, please contact the hospital operator. Locate the [facility] provider you are looking for under [facility] and page to a number that you can be directly reached. If you still have difficulty reaching the provider, please page the [facility] (Director on  Call) for the Hospitalists listed on amion for assistance.

## 2021-06-02 NOTE — Progress Notes (Signed)
PT Cancellation Note  Patient Details Name: Rodney Kelly MRN: [id number] DOB: [birth year]   Cancelled Treatment:   PT attempt. Pt soundly asleep with sitter at bedside. " Pt was up all night yelling out." Will hold PT at this time and return later when pt is alert and appropriate to participate.    [name] 06/02/2021, 1:58 PM

## 2021-06-03 LAB — AEROBIC/ANAEROBIC CULTURE W GRAM STAIN (SURGICAL/DEEP WOUND)

## 2021-06-03 LAB — BASIC METABOLIC PANEL
Anion gap: 7 (ref 5–15)
BUN: 24 mg/dL — ABNORMAL HIGH (ref 8–23)
CO2: 28 mmol/L (ref 22–32)
Calcium: 9.6 mg/dL (ref 8.9–10.3)
Chloride: 103 mmol/L (ref 98–111)
Creatinine, Ser: 0.75 mg/dL (ref 0.61–1.24)
GFR, Estimated: >60 mL/min (ref >60)
Glucose, Bld: 130 mg/dL — ABNORMAL HIGH (ref 70–99)
Potassium: 4.2 mmol/L (ref 3.5–5.1)
Sodium: 138 mmol/L (ref 135–145)

## 2021-06-03 MED ORDER — SODIUM CHLORIDE 0.9 % IV BOLUS
250.0000 mL | Freq: Once | INTRAVENOUS | Status: AC
  Administered 2021-06-03: 250 mL via INTRAVENOUS

## 2021-06-03 NOTE — Progress Notes (Signed)
Physical Therapy Treatment Patient Details Name: Rodney Kelly MRN: [id number] DOB: 1924-01-03 Today's Date: 06/03/2021    History of Present Illness Per MD notes: Pt is a 87 y.o. male with medical history significant of DM, HLD, PCD, GERD, CAD, MI, diabetic foot ulcer, dementia who presents with foot ulcer with infection, work-up significant for osteomyelitis, patient was seen by vascular surgery, no further work-up from their side, he has been followed by podiatry.  MRI significant for osteomyelitis, patient with severe hospital delirium, on 6/17 early a.m., patient with hypoxia, new oxygen requirement and chest x-ray showing aspiration in right lung base.  Hospital course complicated by severe delirium causing severe dysphagia. Right foot cellulitis/myofasciitis with abscess, osteomyelitis right great toe and first metatarsal bone, acute toxic encephalopathy/hospital delirium, aspiration pneumonia, sinus tachycardia, hypokalemia, elevated D-dimers with (-) LE dopplers, and urinary retention. 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    PT Comments    Pt was sitting in recliner upon arriving. He agrees to PT session and is cooperative. Has severe baseline cognition deficits but was able to follow simple one step commands with increased time to process. Pt unaware of wt bearing restrictions or situation. Was able to stand 4 x from recliner with RLE on author's foot to limit wt. Pt very unsteady in standing. Highly recommend DC to SNF to assist pt to PLOF prior to returning home with son.   Follow Up Recommendations  SNF     Equipment Recommendations  Wheelchair cushion (measurements PT);Wheelchair (measurements PT);Rolling walker with 5" wheels       Precautions / Restrictions Precautions Precautions: Fall Required Braces or Orthoses: Other Brace Other Brace: BLE post op shoes Restrictions Weight Bearing  Restrictions: Yes RLE Weight Bearing: Non weight bearing (Allowed heel WB during transfers) LLE Weight Bearing: Weight bearing as tolerated    Mobility  Bed Mobility      General bed mobility comments: in recliner pre/post session    Transfers Overall transfer level: Needs assistance Equipment used: Rolling walker (2 wheeled) Transfers: Sit to/from Stand Sit to Stand: Mod assist;Max assist Stand pivot transfers: Max assist       General transfer comment: pt was able to stand from recliner height 4 x. mod-max to stand however max to pivot with max vcs for technique, sequencing, and safety  Ambulation/Gait    General Gait Details: unsafe at this time due to inability to even transfer with proper wt bearing       Balance Overall balance assessment: Needs assistance Sitting-balance support: No upper extremity supported Sitting balance-Leahy Scale: Fair     Standing balance support: Bilateral upper extremity supported;During functional activity Standing balance-Leahy Scale: Fair Standing balance comment: extremely high fall risk. Requires constant assistance in standing throughout.      Cognition Arousal/Alertness: Awake/alert Behavior During Therapy: WFL for tasks assessed/performed Overall Cognitive Status: History of cognitive impairments - at baseline    General Comments: Oriented to self, pleasant and cooperative             Pertinent Vitals/Pain Pain Assessment: No/denies pain Faces Pain Scale: No hurt     PT Goals (current goals can now be found in the care plan section) Acute Rehab PT Goals Patient Stated Goal: go home Progress towards PT goals: Progressing toward goals    Frequency    Min 2X/week      PT Plan Current plan remains appropriate;Frequency needs to be updated  AM-PAC PT "6 Clicks" Mobility   Outcome Measure  Help needed turning from your back to your side while in a flat bed without using bedrails?: A Little Help needed  moving from lying on your back to sitting on the side of a flat bed without using bedrails?: A Lot Help needed moving to and from a bed to a chair (including a wheelchair)?: A Lot Help needed standing up from a chair using your arms (e.g., wheelchair or bedside chair)?: A Lot Help needed to walk in hospital room?: Total Help needed climbing 3-5 steps with a railing? : Total 6 Click Score: 11    End of Session Equipment Utilized During Treatment: Gait belt Activity Tolerance: Patient tolerated treatment well Patient left: in chair;with chair alarm set;with call bell/phone within reach;Other (comment) (posey belt chair alarm) Nurse Communication: Mobility status PT Visit Diagnosis: Unsteadiness on feet (R26.81);Difficulty in walking, not elsewhere classified (R26.2);Muscle weakness (generalized) (M62.81)     Time: 7591-6384 PT Time Calculation (min) (ACUTE ONLY): 13 min  Charges:  $Therapeutic Activity: 8-22 mins                     Jetta Lout PTA 06/03/21, 2:15 PM

## 2021-06-03 NOTE — Care Management Important Message (Signed)
Important Message  Patient Details  Name: Rodney Kelly MRN: [id number] Date of Birth: 1924/09/18   Medicare Important Message Given:  Yes     [name] [name] 06/03/2021, 11:53 AM

## 2021-06-03 NOTE — Progress Notes (Signed)
PROGRESS NOTE    Rodney Kelly   HKV:[id number]  DOB: June 07, 1982  PCP: Wilkie Aye, MD    DOA: 05/19/2021 LOS: 15   Assessment & Plan   Principal Problem:   Diabetic foot infection (HCC) Active Problems:   Coronary artery disease   Type 2 diabetes mellitus with complication, without long-term current use of insulin (HCC)   Diabetic foot ulcers (HCC)   Hyperlipemia   GERD (gastroesophageal reflux disease)   Normocytic anemia   Cellulitis of right lower extremity   Protein-calorie malnutrition, severe     Right foot, cellulitis/myofasciitis with abscess,  osteomyelitis right great toe and first metatarsal bone -  Failed outpatient PO doxycycline treatment. 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 Ether Griffins) -- SNF bed available 6/30 but pt required sitter, now d/c to SNF delayed as they require 48 without a sitter in hospital.  TOC looking into alternate facilities.   Drug Rash - Macular rash on back present since at least 6/21. likely drug reaction, ID agrees. Slowly improving --monitor   Acute metabolic encephalopathy/severe hospital delirium - Iimproved.  Waxes and wanes. CT head with no acute findings. --AVOID SITTER unless needed for safety --Delirium precautions:     -Lights and TV off, minimize interruptions at night    -Blinds open and lights on during day    -Glasses/hearing aid with patient    -Frequent reorientation    -PT/OT when able    -Avoid sedation medications/Beers list medications   Aspiration pneumonia Oropharyngeal dysphagia Chest x-ray significant for right lung base opacity, on antibiotic coverage, seen closely by SLP, currently  advanced to dysphagia 1 with nectar thick. S/p 5 days flagyl. Resolved. Per SLP remains high risk for aspiration. Discussed w/ son, no family interest in feeding tube (we advise against). --Continue current diet per SLP rec --Aspiration precautions --Oral hygiene  Sinus tachycardia -  Prior attending discussed EKG finding on 6/19 with Dr. Juliann Pares  who reviewed the EKG, doubt it is A. fib, Most likely sinus tachycardia with PACs versus SVT. -- Telemetry   Hypokalemia -replaced and resolved. - monitor and replace as needed   Coronary artery disease -stable, no active chest pain. S/p of stent per his son.   --Continue ASA, Tricor, Imdur   Elevated D-dimer - Lower extremity Dopplers negative for DVT, continue with Lovenox 40 mg subcu daily for prophylaxis.   Urinary retention - Foley catheter inserted 6/17, since has been removed, started on Flomax.   --Continue Flomax   Type 2 diabetes mellitus with complication of foot ulcer, without long-term current use of insulin -  Recent A1c 6.4, well controlled.  On metformin at home --Resumed home metformin --Monitor daily fasting sugar   Hyperlipemia - Fenofibrate.  Pt not taking Lipitor.    GERD - on Protonix   Normocytic anemia - most likely anemia of chronic disease      Son requested patient to be transferred to [facility], previous provider have called [facility] to requested transfer on 6/19, have discussed with internist on-call Dr. Seth Bake, given there is no indication for transfer there, and all services available at our facility, they have declined transfer especially they are at capacity, I have updated the  son at  Patient BMI: Body mass index is 19.21 kg/m.   DVT prophylaxis: enoxaparin (LOVENOX) injection 40 mg Start: 05/23/21 2100   Diet:  Diet Orders (From admission, onward)     Start     Ordered   05/29/21 1218  DIET - DYS 1 Room service appropriate? Yes with Assist; Fluid consistency: Honey Thick  Diet effective now        Comments: Extra Gravy on meats, potatoes. Yogurt and puddings. Magic Cup. Oatmeal per Speech ok.  Question Answer Comment  Room service appropriate? Yes with Assist   Fluid consistency: Honey Thick      05/29/21 1218              Code Status: Full Code   Brief Narrative / Hospital Course to Date:   "87 y.o. male with medical history significant of DM, HLD, PCD, GERD, CAD, MI, diabetic foot ulcer, dementia who presents with foot ulcer with infection, work-up significant for osteomyelitis, patient was seen by vascular surgery, no further work-up from their side, he has been followed by podiatry.  MRI significant for osteomyelitis, patient with severe hospital delirium, on 6/17 early a.m., patient with hypoxia, new oxygen requirement and chest x-ray showing aspiration in right lung base.  Hospital course complicated by severe delirium, aspiration pneumonia with dysphagia."    Subjective 06/03/21    Patient up in recliner sleeping when seen.  He wakes easily but only briefly.  He does not answer questions.  No acute events reported.   Disposition Plan & Communication   Status is: Inpatient  Remains inpatient appropriate because:  requires SNF placement, pending.  Dispo: The patient is from: Home              Anticipated d/c is to: SNF              Patient currently is medically stable to d/c.   Difficult to place patient No    Consults, Procedures, Significant Events   Consultants:  Podiatry Vascular surgery Infectious disease  Procedures:  Right foot first ray amputation 6/24  Antimicrobials:  Anti-infectives (From admission, onward)    Start     Dose/Rate Route Frequency Ordered Stop   05/30/21 2200  doxycycline (VIBRA-TABS) tablet 100 mg        100 mg Oral Every 12 hours 05/30/21 1041     05/30/21 2000  vancomycin (VANCOREADY) IVPB 750 mg/150 mL  Status:  Discontinued        750 mg 150 mL/hr over 60 Minutes Intravenous Every 24 hours 05/30/21 0924 05/30/21 1040    05/29/21 1800  vancomycin (VANCOCIN) IVPB 1000 mg/200 mL premix  Status:  Discontinued        1,000 mg 200 mL/hr over 60 Minutes Intravenous Every 24 hours 05/28/21 1247 05/30/21 0924   05/28/21 1800  vancomycin (VANCOREADY) IVPB 1250 mg/250 mL        1,250 mg 166.7 mL/hr over 90 Minutes Intravenous  Once 05/28/21 1247 05/28/21 1942   05/28/21 1419  vancomycin (VANCOCIN) powder  Status:  Discontinued          As needed 05/28/21 1419 05/28/21 1420   05/24/21 1400  ceFAZolin (ANCEF) IVPB 2g/100 mL premix  Status:  Discontinued        2 g 200 mL/hr over 30 Minutes Intravenous Every 8 hours 05/24/21 1116 05/26/21 1037   05/21/21 1300  vancomycin (VANCOCIN) IVPB 1000 mg/200 mL premix  Status:  Discontinued        1,000  mg 200 mL/hr over 60 Minutes Intravenous Every 24 hours 05/21/21 1238 05/21/21 1620   05/21/21 1030  ceFEPIme (MAXIPIME) 2 g in sodium chloride 0.9 % 100 mL IVPB  Status:  Discontinued        2 g 200 mL/hr over 30 Minutes Intravenous Every 12 hours 05/21/21 0932 05/24/21 1116   05/21/21 1000  metroNIDAZOLE (FLAGYL) IVPB 500 mg        500 mg 100 mL/hr over 60 Minutes Intravenous Every 8 hours 05/21/21 0914 05/25/21 1825   05/20/21 1100  vancomycin (VANCOCIN) IVPB 1000 mg/200 mL premix  Status:  Discontinued        1,000 mg 200 mL/hr over 60 Minutes Intravenous Every 24 hours 05/19/21 1033 05/21/21 1238   05/19/21 0930  vancomycin (VANCOREADY) IVPB 1250 mg/250 mL        1,250 mg 166.7 mL/hr over 90 Minutes Intravenous  Once 05/19/21 0845 05/19/21 1231   05/19/21 0900  aztreonam (AZACTAM) 2 g in sodium chloride 0.9 % 100 mL IVPB        2 g 200 mL/hr over 30 Minutes Intravenous  Once 05/19/21 0845 05/19/21 1037         Micro    Objective   Vitals:   06/03/21 0122 06/03/21 0342 06/03/21 0805 06/03/21 1159  BP: (!) 98/39 108/63 108/66 101/61  Pulse: 91 93 86 89  Resp: 16 16 17 18   Temp:  97.8 F (36.6 C) 97.7 F (36.5 C) 98 F (36.7 C)  TempSrc:      SpO2: 94%  96% 92% 97%  Weight:      Height:        Intake/Output Summary (Last 24 hours) at 06/03/2021 1636 Last data filed at 06/03/2021 0300 Gross per 24 hour  Intake 620.38 ml  Output --  Net 620.38 ml   Filed Weights   05/19/21 0722 05/28/21 1223  Weight: 59 kg 59 kg    Physical Exam:  General exam: Lethargic, in recliner, wakes briefly to voice, no acute distress Respiratory system: CTAB, normal respiratory effort. Cardiovascular system: normal S1/S2, RRR, no pedal edema.   Gastrointestinal system: soft, NT, ND, no HSM felt, +bowel sounds. Skin: dry, intact, normal temperature  Labs   Data Reviewed: I have personally reviewed following labs and imaging studies  CBC: Recent Labs  Lab 05/29/21 0424  WBC 10.1  HGB 10.9*  HCT 34.0*  MCV 88.3  PLT 329   Basic Metabolic Panel: Recent Labs  Lab 05/28/21 0457 05/29/21 0424 05/30/21 0413 05/31/21 0515 06/01/21 0733 06/02/21 0627 06/03/21 0454  NA  --  139 141 141 140  --  138  K  --  3.8 3.7 4.2 4.0  --  4.2  CL  --  101 103 103 102  --  103  CO2  --  31 32 33* 31  --  28  GLUCOSE 132* 135* 112* 133* 130* 139* 130*  BUN  --  13 27* 30* 26*  --  24*  CREATININE  --  0.54* 0.91 0.72 0.73  --  0.75  CALCIUM  --  9.4 9.3 9.4 9.6  --  9.6  MG 1.9  --   --   --   --   --   --    GFR: Estimated Creatinine Clearance: 47.1 mL/min (by C-G formula based on SCr of 0.75 mg/dL). Liver Function Tests: No results for input(s): AST, ALT, ALKPHOS, BILITOT, PROT, ALBUMIN in the last 168 hours. No results for input(s):  LIPASE, AMYLASE in the last 168 hours. No results for input(s): AMMONIA in the last 168 hours. Coagulation Profile: No results for input(s): INR, PROTIME in the last 168 hours. Cardiac Enzymes: No results for input(s): CKTOTAL, CKMB, CKMBINDEX, TROPONINI in the last 168 hours. BNP (last 3 results) No results for input(s): PROBNP in the last 8760 hours. HbA1C: No results for input(s): HGBA1C in the last 72  hours. CBG: Recent Labs  Lab 05/28/21 1410 05/28/21 1500 05/28/21 2058 05/29/21 0744 05/29/21 2044  GLUCAP 130* 120* 131* 120* 177*   Lipid Profile: No results for input(s): CHOL, HDL, LDLCALC, TRIG, CHOLHDL, LDLDIRECT in the last 72 hours. Thyroid Function Tests: No results for input(s): TSH, T4TOTAL, FREET4, T3FREE, THYROIDAB in the last 72 hours. Anemia Panel: No results for input(s): VITAMINB12, FOLATE, FERRITIN, TIBC, IRON, RETICCTPCT in the last 72 hours. Sepsis Labs: No results for input(s): PROCALCITON, LATICACIDVEN in the last 168 hours.  Recent Results (from the past 240 hour(s))  Aerobic/Anaerobic Culture w Gram Stain (surgical/deep wound)     Status: Abnormal   Collection Time: 05/28/21  1:36 PM   Specimen: PATH Other; Tissue  Result Value Ref Range Status   Specimen Description   Final    WOUND Performed at [facility], [address], [place], [place] [address]    Special Requests RIGHT SESAMOID BONE  Final   Gram Stain   Final    RARE WBC PRESENT,BOTH PMN AND MONONUCLEAR RARE GRAM POSITIVE COCCI RARE GRAM POSITIVE RODS    Culture (A)  Final    MULTIPLE ORGANISMS PRESENT, NONE PREDOMINANT NO GROUP A STREP (S.PYOGENES) ISOLATED NO STAPHYLOCOCCUS AUREUS ISOLATED NO ANAEROBES ISOLATED Performed at [facility], [address], [place], [place] [address]    Report Status 06/03/2021 FINAL  Final  Resp Panel by RT-PCR (Flu A&B, Covid) Nasopharyngeal Swab     Status: None   Collection Time: 06/02/21  6:17 AM   Specimen: Nasopharyngeal Swab; Nasopharyngeal(NP) swabs in vial transport medium  Result Value Ref Range Status   SARS Coronavirus 2 by RT PCR NEGATIVE NEGATIVE Final    Comment: (NOTE) SARS-CoV-2 target nucleic acids are NOT DETECTED.  The SARS-CoV-2 RNA is generally detectable in upper respiratory specimens during the acute phase of infection. The lowest concentration of SARS-CoV-2 viral copies this assay can detect is 138  copies/mL. A negative result does not preclude SARS-Cov-2 infection and should not be used as the sole basis for treatment or other patient management decisions. A negative result may occur with  improper specimen collection/handling, submission of specimen other than nasopharyngeal swab, presence of viral mutation(s) within the areas targeted by this assay, and inadequate number of viral copies(<138 copies/mL). A negative result must be combined with clinical observations, patient history, and epidemiological information. The expected result is Negative.  Fact Sheet for Patients:  BloggerCourse.com  Fact Sheet for Healthcare Providers:  SeriousBroker.it  This test is no t yet approved or cleared by the [place] FDA and  has been authorized for detection and/or diagnosis of SARS-CoV-2 by FDA under an Emergency Use Authorization (EUA). This EUA will remain  in effect (meaning this test can be used) for the duration of the COVID-19 declaration under Section 564(b)(1) of the Act, 21 U.S.C.section 360bbb-3(b)(1), unless the authorization is terminated  or revoked sooner.       Influenza A by PCR NEGATIVE NEGATIVE Final   Influenza B by PCR NEGATIVE NEGATIVE Final    Comment: (NOTE) The Xpert Xpress SARS-CoV-2/FLU/RSV  plus assay is intended as an aid in the diagnosis of influenza from Nasopharyngeal swab specimens and should not be used as a sole basis for treatment. Nasal washings and aspirates are unacceptable for Xpert Xpress SARS-CoV-2/FLU/RSV testing.  Fact Sheet for Patients: BloggerCourse.com  Fact Sheet for Healthcare Providers: SeriousBroker.it  This test is not yet approved or cleared by the [place] FDA and has been authorized for detection and/or diagnosis of SARS-CoV-2 by FDA under an Emergency Use Authorization (EUA). This EUA will remain in effect (meaning  this test can be used) for the duration of the COVID-19 declaration under Section 564(b)(1) of the Act, 21 U.S.C. section 360bbb-3(b)(1), unless the authorization is terminated or revoked.  Performed at [facility], [address] [place], [place] [address]       Imaging Studies   No results found.   Medications   Scheduled Meds:  vitamin C  250 mg Oral BID   aspirin  81 mg Oral Daily   Chlorhexidine Gluconate Cloth  6 each Topical Daily   doxycycline  100 mg Oral Q12H   enoxaparin (LOVENOX) injection  40 mg Subcutaneous Q24H   fenofibrate  160 mg Oral Daily   metFORMIN  1,000 mg Oral BID WC   multivitamin with minerals  1 tablet Oral Daily   mupirocin ointment   Topical Daily   pantoprazole  40 mg Oral Daily   sodium chloride flush  10 mL Intravenous Q12H   tamsulosin  0.4 mg Oral QPC supper   Continuous Infusions:     LOS: 15 days    Time spent: 25 minutes with > 50% spent at bedside and in coordination of care.    Pennie Banter, DO [facility]  06/03/2021, 4:36 PM      If 7PM-7AM, please contact night-coverage. How to contact the [facility] Attending or Consulting provider 7A - 7P or covering provider during after hours 7P -7A, for this patient?    Check the care team in [facility] and look for a) attending/consulting TRH provider listed and b) the [facility] team listed Log into [email or web address] and use [place]'s universal password to access. If you do not have the password, please contact the hospital operator. Locate the [facility] provider you are looking for under [facility] and page to a number that you can be directly reached. If you still have difficulty reaching the provider, please page the [facility] (Director on Call) for the Hospitalists listed on amion for assistance.

## 2021-06-03 NOTE — Progress Notes (Signed)
Occupational Therapy Treatment Patient Details Name: Rodney Kelly MRN: [id number] DOB: 02/21/1924 Today's Date: 06/03/2021    History of present illness Per MD notes: Pt is a 87 y.o. male with medical history significant of DM, HLD, PCD, GERD, CAD, MI, diabetic foot ulcer, dementia who presents with foot ulcer with infection, work-up significant for osteomyelitis, patient was seen by vascular surgery, no further work-up from their side, he has been followed by podiatry.  MRI significant for osteomyelitis, patient with severe hospital delirium, on 6/17 early a.m., patient with hypoxia, new oxygen requirement and chest x-ray showing aspiration in right lung base.  Hospital course complicated by severe delirium causing severe dysphagia. Right foot cellulitis/myofasciitis with abscess, osteomyelitis right great toe and first metatarsal bone, acute toxic encephalopathy/hospital delirium, aspiration pneumonia, sinus tachycardia, hypokalemia, elevated D-dimers with (-) LE dopplers, and urinary retention. 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   OT comments  Pt seen for OT tx this date to f/u re: safety with ADLs/ADL moblity. Pt requires MIN A for sup to sit and demos G static sitting balance EOB. Pt requires MOD/MAX A for ADL transfers with RW including SPS from EOB to [facility] and then to chair. Pt requires MAX A for peri care using sit/lateral lean method. Pt requires MIN/MOD verbal/tactile cues for safety/sequence and precaution adherence throughout. Pt Left in chair with head start chair alarm in place and pt demo'ed ability to remove Velcro straps. All needs met and in reach. Will continue to follow. Continue to anticipate pt will require STR f/u.    Follow Up Recommendations  SNF    Equipment Recommendations  3 in 1 bedside commode    Recommendations for Other Services      Precautions / Restrictions  Precautions Precautions: Fall Required Braces or Orthoses: Other Brace Other Brace: BLE post op shoes Restrictions Weight Bearing Restrictions: Yes RLE Weight Bearing: Non weight bearing (allowed heel WB for SPS) LLE Weight Bearing: Weight bearing as tolerated Other Position/Activity Restrictions: BLE post op shoes       Mobility Bed Mobility Overal bed mobility: Needs Assistance Bed Mobility: Supine to Sit     Supine to sit: Min assist;HOB elevated     General bed mobility comments: increased time    Transfers Overall transfer level: Needs assistance Equipment used: Rolling walker (2 wheeled) Transfers: Sit to/from [facility] Sit to Stand: Mod assist;Max assist Stand pivot transfers: Mod assist;Max assist       General transfer comment: increased time and cues for adherance to WB precautions    Balance Overall balance assessment: Needs assistance Sitting-balance support: No upper extremity supported Sitting balance-Leahy Scale: Fair Sitting balance - Comments: static sitting at EOB Postural control: Posterior lean Standing balance support: Bilateral upper extremity supported;During functional activity Standing balance-Leahy Scale: Fair Standing balance comment: high fall risk, poor safety awareness and understanding of precations                           ADL either performed or assessed with clinical judgement   ADL Overall ADL's : Needs assistance/impaired     Grooming: Wash/dry hands;Wash/dry face;Set up;Cueing for sequencing;Sitting Grooming Details (indicate cue type and reason): cues requires for hand hygiene, but not washing face                 Toilet Transfer: Moderate assistance;Stand-pivot;RW;BSC Toilet Transfer Details (indicate cue type and  reason): increaased time and cues for precaution adherance Toileting- Clothing Manipulation and Hygiene: Maximal assistance;Sitting/lateral lean Toileting - Clothing  Manipulation Details (indicate cue type and reason): for peri care             Vision Patient Visual Report: No change from baseline     Perception     Praxis      Cognition Arousal/Alertness: Awake/alert Behavior During Therapy: WFL for tasks assessed/performed Overall Cognitive Status: History of cognitive impairments - at baseline                                 General Comments: Oriented to self, pleasant and cooperative. requires increased processing time and cues for safety, sequencing and precautions.        Exercises Other Exercises Other Exercises: Pt and son ed re: safety including WB restrictions   Shoulder Instructions       General Comments      Pertinent Vitals/ Pain       Pain Assessment: No/denies pain Faces Pain Scale: No hurt  Home Living                                          Prior Functioning/Environment              Frequency  Min 1X/week        Progress Toward Goals  OT Goals(current goals can now be found in the care plan section)  Progress towards OT goals: Progressing toward goals  Acute Rehab OT Goals Patient Stated Goal: go home OT Goal Formulation: With patient Time For Goal Achievement: 06/15/21 Potential to Achieve Goals: [place] Discharge plan remains appropriate;Frequency remains appropriate    Co-evaluation                 AM-PAC OT "6 Clicks" Daily Activity     Outcome Measure   Help from another person eating meals?: A Little Help from another person taking care of personal grooming?: A Little Help from another person toileting, which includes using toliet, bedpan, or urinal?: A Lot Help from another person bathing (including washing, rinsing, drying)?: A Lot Help from another person to put on and taking off regular upper body clothing?: A Lot Help from another person to put on and taking off regular lower body clothing?: A Lot 6 Click Score: 14    End of  Session Equipment Utilized During Treatment: Gait belt;Rolling walker  OT Visit Diagnosis: Unsteadiness on feet (R26.81);Muscle weakness (generalized) (M62.81)   Activity Tolerance Patient tolerated treatment well   Patient Left in bed;with bed alarm set;with nursing/sitter in room   Nurse Communication Other (comment);Weight bearing status (post op shoes)        Time: 8115-7262 OT Time Calculation (min): 39 min  Charges: OT General Charges $OT Visit: 1 Visit OT Treatments $Self Care/Home Management : 23-37 mins $Therapeutic Activity: 8-22 mins  Gerrianne Scale, MS, OTR/L ascom [phone] 06/03/21, 1:15 PM

## 2021-06-03 NOTE — Progress Notes (Signed)
Patient ran low BP's overnight, 92/46 and 98/35. Patient had pulled IV access during prior shift, was able to obtain new IV line. Per MD, NS bolus x2 with BP's normalizing.

## 2021-06-04 DIAGNOSIS — M86171 Other acute osteomyelitis, right ankle and foot: Secondary | ICD-10-CM

## 2021-06-04 LAB — GLUCOSE, RANDOM: Glucose, Bld: 130 mg/dL — ABNORMAL HIGH (ref 70–99)

## 2021-06-04 NOTE — TOC Progression Note (Signed)
Transition of Care [facility]) - Progression Note    Patient Details  Name: Rodney Kelly MRN: [id number] Date of Birth: 06-Oct-1924  Transition of Care [facility]) CM/SW Contact  Caryn Section, RN Phone Number: 06/04/2021, 11:22 AM  Clinical Narrative:  Patient has no other bed offers at this time.  [facility] still unable to take patient as sitter was only DC yesterday and facility requires 48 hours of no sitter prior to transfer. Per son Onalee Hua and Revonda Standard at [facility], please resubmit patient to [facility] for possible transfer to [facility] on Monday. TOC will follow for resubmission    Expected Discharge Plan: Home w Home Health Services Barriers to Discharge: Continued Medical Work up  Expected Discharge Plan and Services Expected Discharge Plan: Home w Home Health Services   Discharge Planning Services: CM Consult Post Acute Care Choice: Home Health                                         Social Determinants of Health (SDOH) Interventions    Readmission Risk Interventions No flowsheet data found.

## 2021-06-04 NOTE — Progress Notes (Addendum)
PROGRESS NOTE    Rodney Kelly   ZOX:[id number]  DOB: 03-Aug-1924  PCP: Wilkie AyeShaner, Brian Allen, MD    DOA: 05/19/2021 LOS: 16   Assessment & Plan   Principal Problem:   Diabetic foot infection (HCC) Active Problems:   Coronary artery disease   Type 2 diabetes mellitus with complication, without long-term current use of insulin (HCC)   Diabetic foot ulcers (HCC)   Hyperlipemia   GERD (gastroesophageal reflux disease)   Normocytic anemia   Cellulitis of right lower extremity   Protein-calorie malnutrition, severe     Right foot, cellulitis/myofasciitis with abscess,  osteomyelitis right great toe and first metatarsal bone -  Failed outpatient PO doxycycline treatment. 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 Ether GriffinsFowler) -- SNF bed available 6/30 but pt required sitter, now d/c to SNF delayed as they require 48 without a sitter in hospital.  TOC looking into alternate facilities.   Drug Rash - Macular rash on back present since at least 6/21. likely drug reaction, ID agrees. Slowly improving --monitor   Acute metabolic encephalopathy/severe hospital delirium - Iimproved.  Waxes and wanes. CT head with no acute findings. --AVOID SITTER unless needed for safety --Delirium precautions:     -Lights and TV off, minimize interruptions at night    -Blinds open and lights on during day    -Glasses/hearing aid with patient    -Frequent reorientation    -PT/OT when able    -Avoid sedation medications/Beers list medications   Aspiration pneumonia Oropharyngeal dysphagia Chest x-ray significant for right lung base opacity, on antibiotic coverage, seen closely by SLP, currently advanced to dysphagia 1 with nectar thick. S/p 5  days flagyl. Resolved. Per SLP remains high risk for aspiration. Discussed w/ son, no family interest in feeding tube (we advise against). --Continue current diet per SLP rec --Aspiration precautions --Oral hygiene  Sinus tachycardia -  Prior attending discussed EKG finding on 6/19 with Dr. Juliann Paresallwood  who reviewed the EKG, doubt it is A. fib, Most likely sinus tachycardia with PACs versus SVT. -- Telemetry   Hypokalemia -replaced and resolved. - monitor and replace as needed   Coronary artery disease -stable, no active chest pain. S/p of stent per his son.   --Continue ASA, Tricor, Imdur   Elevated D-dimer - Lower extremity Dopplers negative for DVT, continue with Lovenox 40 mg subcu daily for prophylaxis.   Urinary retention - Foley catheter inserted 6/17, since has been removed, started on Flomax.   --Continue Flomax   Type 2 diabetes mellitus with complication of foot ulcer, without long-term current use of insulin -  Recent A1c 6.4, well controlled.  On metformin at home --Resumed home metformin --Monitor daily fasting sugar   Hyperlipemia - Fenofibrate.  Pt not taking Lipitor.    GERD - on Protonix   Normocytic anemia - most likely anemia of chronic disease      [facility] provider have called [facility] to requested transfer on 6/19, have discussed with internist on-call Dr. Seth BakeWahid Lanna, given there is no indication for transfer there, and all services available at our facility, they have declined transfer especially they are at capacity, I have updated the son at  Patient BMI: Body mass  index is 19.21 kg/m.   DVT prophylaxis: enoxaparin (LOVENOX) injection 40 mg Start: 05/23/21 2100   Diet:  Diet Orders (From admission, onward)     Start     Ordered   05/29/21 1218  DIET - DYS 1 Room service appropriate? Yes with Assist; Fluid consistency: Honey Thick  Diet effective now       Comments: Extra Gravy on meats, potatoes. Yogurt  and puddings. Magic Cup. Oatmeal per Speech ok.  Question Answer Comment  Room service appropriate? Yes with Assist   Fluid consistency: Honey Thick      05/29/21 1218              Code Status: Full Code   Brief Narrative / Hospital Course to Date:   "87 y.o. male with medical history significant of DM, HLD, PCD, GERD, CAD, MI, diabetic foot ulcer, dementia who presents with foot ulcer with infection, work-up significant for osteomyelitis, patient was seen by vascular surgery, no further work-up from their side, he has been followed by podiatry.  MRI significant for osteomyelitis, patient with severe hospital delirium, on 6/17 early a.m., patient with hypoxia, new oxygen requirement and chest x-ray showing aspiration in right lung base.  Hospital course complicated by severe delirium, aspiration pneumonia with dysphagia."    Subjective 06/04/21    Patient sleeping very soundly, wakes only briefly.  Nonverbal.  No acute events reported.  Appears in no distress.   Disposition Plan & Communication   Status is: Inpatient  Remains inpatient appropriate because:  requires SNF placement, pending.    Dispo: The patient is from: Home              Anticipated d/c is to: SNF              Patient currently is medically stable to d/c.   Difficult to place patient No    Consults, Procedures, Significant Events   Consultants:  Podiatry Vascular surgery Infectious disease  Procedures:  Right foot first ray amputation 6/24  Antimicrobials:  Anti-infectives (From admission, onward)    Start     Dose/Rate Route Frequency Ordered Stop   05/30/21 2200  doxycycline (VIBRA-TABS) tablet 100 mg        100 mg Oral Every 12 hours 05/30/21 1041 06/09/21 2359   05/30/21 2000  vancomycin (VANCOREADY) IVPB 750 mg/150 mL  Status:  Discontinued        750 mg 150 mL/hr over 60 Minutes Intravenous Every 24 hours 05/30/21 0924 05/30/21 1040   05/29/21 1800  vancomycin (VANCOCIN) IVPB 1000 mg/200  mL premix  Status:  Discontinued        1,000 mg 200 mL/hr over 60 Minutes Intravenous Every 24 hours 05/28/21 1247 05/30/21 0924   05/28/21 1800  vancomycin (VANCOREADY) IVPB 1250 mg/250 mL        1,250 mg 166.7 mL/hr over 90 Minutes Intravenous  Once 05/28/21 1247 05/28/21 1942   05/28/21 1419  vancomycin (VANCOCIN) powder  Status:  Discontinued          As needed 05/28/21 1419 05/28/21 1420   05/24/21 1400  ceFAZolin (ANCEF) IVPB 2g/100 mL premix  Status:  Discontinued        2 g 200 mL/hr over 30 Minutes Intravenous Every 8 hours 05/24/21 1116 05/26/21 1037   05/21/21 1300  vancomycin (VANCOCIN) IVPB 1000 mg/200 mL premix  Status:  Discontinued        1,000 mg 200 mL/hr over 60 Minutes Intravenous Every 24 hours 05/21/21  1238 05/21/21 1620   05/21/21 1030  ceFEPIme (MAXIPIME) 2 g in sodium chloride 0.9 % 100 mL IVPB  Status:  Discontinued        2 g 200 mL/hr over 30 Minutes Intravenous Every 12 hours 05/21/21 0932 05/24/21 1116   05/21/21 1000  metroNIDAZOLE (FLAGYL) IVPB 500 mg        500 mg 100 mL/hr over 60 Minutes Intravenous Every 8 hours 05/21/21 0914 05/25/21 1825   05/20/21 1100  vancomycin (VANCOCIN) IVPB 1000 mg/200 mL premix  Status:  Discontinued        1,000 mg 200 mL/hr over 60 Minutes Intravenous Every 24 hours 05/19/21 1033 05/21/21 1238   05/19/21 0930  vancomycin (VANCOREADY) IVPB 1250 mg/250 mL        1,250 mg 166.7 mL/hr over 90 Minutes Intravenous  Once 05/19/21 0845 05/19/21 1231   05/19/21 0900  aztreonam (AZACTAM) 2 g in sodium chloride 0.9 % 100 mL IVPB        2 g 200 mL/hr over 30 Minutes Intravenous  Once 05/19/21 0845 05/19/21 1037         Micro    Objective   Vitals:   06/04/21 0013 06/04/21 0333 06/04/21 0839 06/04/21 1128  BP: 117/75 136/80 (!) 111/54 133/68  Pulse: (!) 110 95 79 (!) 54  Resp: Temp: 99.1 F (37.3 C) 98.6 F (37 C) 97.6 F (36.4 C) 98.7 F (37.1 C)  TempSrc:   Oral   SpO2: 100% 96% 97% 99%  Weight:       Height:        Intake/Output Summary (Last 24 hours) at 06/04/2021 1714 Last data filed at 06/04/2021 1341 Gross per 24 hour  Intake 0 ml  Output --  Net 0 ml   Filed Weights   05/19/21 0722 05/28/21 1223  Weight: 59 kg 59 kg    Physical Exam:  General exam: Sleeping soundly, responds briefly and falls back to sleep, nonverbal, no acute distress Respiratory system: CTAB, normal respiratory effort, no wheezes or rhonchi heard. Cardiovascular system: normal S1/S2, RRR, no pedal edema.   Gastrointestinal system: soft, NT, ND  Labs   Data Reviewed: I have personally reviewed following labs and imaging studies  CBC: Recent Labs  Lab 05/29/21 0424  WBC 10.1  HGB 10.9*  HCT 34.0*  MCV 88.3  PLT 329   Basic Metabolic Panel: Recent Labs  Lab 05/29/21 0424 05/30/21 0413 05/31/21 0515 06/01/21 0733 06/02/21 0627 06/03/21 0454 06/04/21 0440  NA 139 141 141 140  --  138  --   K 3.8 3.7 4.2 4.0  --  4.2  --   CL 101 103 103 102  --  103  --   CO2 31 32 33* 31  --  28  --   GLUCOSE 135* 112* 133* 130* 139* 130* 130*  BUN 13 27* 30* 26*  --  24*  --   CREATININE 0.54* 0.91 0.72 0.73  --  0.75  --   CALCIUM 9.4 9.3 9.4 9.6  --  9.6  --    GFR: Estimated Creatinine Clearance: 47.1 mL/min (by C-G formula based on SCr of 0.75 mg/dL). Liver Function Tests: No results for input(s): AST, ALT, ALKPHOS, BILITOT, PROT, ALBUMIN in the last 168 hours. No results for input(s): LIPASE, AMYLASE in the last 168 hours. No results for input(s): AMMONIA in the last 168 hours. Coagulation Profile: No results for input(s): INR, PROTIME in the last 168  hours. Cardiac Enzymes: No results for input(s): CKTOTAL, CKMB, CKMBINDEX, TROPONINI in the last 168 hours. BNP (last 3 results) No results for input(s): PROBNP in the last 8760 hours. HbA1C: No results for input(s): HGBA1C in the last 72 hours. CBG: Recent Labs  Lab 05/28/21 2058 05/29/21 0744 05/29/21 2044  GLUCAP 131* 120* 177*    Lipid Profile: No results for input(s): CHOL, HDL, LDLCALC, TRIG, CHOLHDL, LDLDIRECT in the last 72 hours. Thyroid Function Tests: No results for input(s): TSH, T4TOTAL, FREET4, T3FREE, THYROIDAB in the last 72 hours. Anemia Panel: No results for input(s): VITAMINB12, FOLATE, FERRITIN, TIBC, IRON, RETICCTPCT in the last 72 hours. Sepsis Labs: No results for input(s): PROCALCITON, LATICACIDVEN in the last 168 hours.  Recent Results (from the past 240 hour(s))  Aerobic/Anaerobic Culture w Gram Stain (surgical/deep wound)     Status: Abnormal   Collection Time: 05/28/21  1:36 PM   Specimen: PATH Other; Tissue  Result Value Ref Range Status   Specimen Description   Final    WOUND Performed at [facility], [address], [place], [place] [address]    Special Requests RIGHT SESAMOID BONE  Final   Gram Stain   Final    RARE WBC PRESENT,BOTH PMN AND MONONUCLEAR RARE GRAM POSITIVE COCCI RARE GRAM POSITIVE RODS    Culture (A)  Final    MULTIPLE ORGANISMS PRESENT, NONE PREDOMINANT NO GROUP A STREP (S.PYOGENES) ISOLATED NO STAPHYLOCOCCUS AUREUS ISOLATED NO ANAEROBES ISOLATED Performed at [facility], [address], [place], [place] [address]    Report Status 06/03/2021 FINAL  Final  Resp Panel by RT-PCR (Flu A&B, Covid) Nasopharyngeal Swab     Status: None   Collection Time: 06/02/21  6:17 AM   Specimen: Nasopharyngeal Swab; Nasopharyngeal(NP) swabs in vial transport medium  Result Value Ref Range Status   SARS Coronavirus 2 by RT PCR NEGATIVE NEGATIVE Final    Comment: (NOTE) SARS-CoV-2 target nucleic acids are NOT DETECTED.  The SARS-CoV-2 RNA is generally detectable in upper respiratory specimens during the acute phase of infection. The lowest concentration of SARS-CoV-2 viral copies this assay can detect is 138 copies/mL. A negative result does not preclude SARS-Cov-2 infection and should not be used as the sole basis for treatment or other patient  management decisions. A negative result may occur with  improper specimen collection/handling, submission of specimen other than nasopharyngeal swab, presence of viral mutation(s) within the areas targeted by this assay, and inadequate number of viral copies(<138 copies/mL). A negative result must be combined with clinical observations, patient history, and epidemiological information. The expected result is Negative.  Fact Sheet for Patients:  BloggerCourse.com  Fact Sheet for Healthcare Providers:  SeriousBroker.it  This test is no t yet approved or cleared by the [place] FDA and  has been authorized for detection and/or diagnosis of SARS-CoV-2 by FDA under an Emergency Use Authorization (EUA). This EUA will remain  in effect (meaning this test can be used) for the duration of the COVID-19 declaration under Section 564(b)(1) of the Act, 21 U.S.C.section 360bbb-3(b)(1), unless the authorization is terminated  or revoked sooner.       Influenza A by PCR NEGATIVE NEGATIVE Final   Influenza B by PCR NEGATIVE NEGATIVE Final    Comment: (NOTE) The Xpert Xpress SARS-CoV-2/FLU/RSV plus assay is intended as an aid in the diagnosis of influenza from Nasopharyngeal swab specimens and should not be used as a sole basis for treatment. Nasal washings and aspirates are unacceptable for Xpert  Xpress SARS-CoV-2/FLU/RSV testing.  Fact Sheet for Patients: BloggerCourse.com  Fact Sheet for Healthcare Providers: SeriousBroker.it  This test is not yet approved or cleared by the [place] FDA and has been authorized for detection and/or diagnosis of SARS-CoV-2 by FDA under an Emergency Use Authorization (EUA). This EUA will remain in effect (meaning this test can be used) for the duration of the COVID-19 declaration under Section 564(b)(1) of the Act, 21 U.S.C. section 360bbb-3(b)(1),  unless the authorization is terminated or revoked.  Performed at [facility], [address], [place], [place] [address]       Imaging Studies   No results found.   Medications   Scheduled Meds:  vitamin C  250 mg Oral BID   aspirin  81 mg Oral Daily   Chlorhexidine Gluconate Cloth  6 each Topical Daily   doxycycline  100 mg Oral Q12H   enoxaparin (LOVENOX) injection  40 mg Subcutaneous Q24H   fenofibrate  160 mg Oral Daily   metFORMIN  1,000 mg Oral BID WC   multivitamin with minerals  1 tablet Oral Daily   mupirocin ointment   Topical Daily   pantoprazole  40 mg Oral Daily   sodium chloride flush  10 mL Intravenous Q12H   tamsulosin  0.4 mg Oral QPC supper   Continuous Infusions:     LOS: 16 days    Time spent: 20 minutes     Pennie Banter, DO [facility]  06/04/2021, 5:14 PM      If 7PM-7AM, please contact night-coverage. How to contact the [facility] Attending or Consulting provider 7A - 7P or covering provider during after hours 7P -7A, for this patient?    Check the care team in [facility] and look for a) attending/consulting [facility] provider listed and b) the [facility] team listed Log into [email or web address] and use [place]'s universal password to access. If you do not have the password, please contact the hospital operator. Locate the [facility] provider you are looking for under [facility] and page to a number that you can be directly reached. If you still have difficulty reaching the provider, please page the [facility] (Director on Call) for the Hospitalists listed on amion for assistance.

## 2021-06-04 NOTE — Progress Notes (Signed)
ID Pt lying in bed with eyes closed On calling his name he nodded  Patient Vitals for the past 24 hrs:  BP Temp Temp src Pulse Resp SpO2  06/04/21 1128 133/68 98.7 F (37.1 C) -- (!) 54 18 99 %  06/04/21 0839 (!) 111/54 97.6 F (36.4 C) Oral 79 16 97 %  06/04/21 0333 136/80 98.6 F (37 C) -- 95 17 96 %  06/04/21 0013 117/75 99.1 F (37.3 C) -- (!) 110 17 100 %  06/03/21 2017 128/63 98 F (36.7 C) -- 91 18 98 %  06/03/21 1727 116/68 97.8 F (36.6 C) Oral 88 16 99 %    Skin Rash trunk- faded Chest b/l air entry [id number] Abd soft  Rt foot dressing removed 1 st toe amputation site looks great- well approximated- no discharge, no erythema Small 3 mm wound on the dorsal aspect of the MTP area scabbing     Labs CBC Latest Ref Rng & Units 05/29/2021 05/27/2021 05/26/2021  WBC 4.0 - 10.5 K/uL 10.1 7.4 7.2  Hemoglobin 13.0 - 17.0 g/dL 10.9(L) 10.9(L) 10.1(L)  Hematocrit 39.0 - 52.0 % 34.0(L) 33.7(L) 31.8(L)  Platelets 150 - 400 K/uL 329 358 349     CMP Latest Ref Rng & Units 06/04/2021 06/03/2021 06/02/2021  Glucose 70 - 99 mg/dL 979(G) 921(J) 941(D)  BUN 8 - 23 mg/dL - 40(C) -  Creatinine 1.44 - 1.24 mg/dL - 8.18 -  Sodium 563 - 145 mmol/L - 138 -  Potassium 3.5 - 5.1 mmol/L - 4.2 -  Chloride 98 - 111 mmol/L - 103 -  CO2 22 - 32 mmol/L - 28 -  Calcium 8.9 - 10.3 mg/dL - 9.6 -  Total Protein 6.5 - 8.1 g/dL - - -  Total Bilirubin 0.3 - 1.2 mg/dL - - -  Alkaline Phos 38 - 126 U/L - - -  AST 15 - 41 U/L - - -  ALT 0 - 44 U/L - - -     Pathology of bone BONE UNDERLYING INKED PLANTAR AREA WITH ACUTE OSTEOMYELITIS.  - VIABLE SOFT TISSUE WITH FOCAL ACUTE INFLAMMATION AT RESECTION MARGIN.  - VIABLE BONE AT RESECTION MARGIN, NEGATIVE FOR ACUTE OSTEOMYELITIS.   Bone culture- no organism of pathological significance cultured   Impression/recommendation  Chronic right foot wound on the first MTP with underlying osteomyelitis.  He underwent first ray amputation on 05/28/2021.  Culture  from the bone did not grow any organisms of pathological significance. Bone biopsy shows osteo of the diseased bone but viable bone at resection margin with no acute osteomyelitis. So this is therapeutic amputation.  Also clinically the small wound is scabbing and the surgical site is looking great. He is currently on doxycycline and end date would be 06/09/2021. Discussed with care team  ID will sign off call if needed.

## 2021-06-05 LAB — BASIC METABOLIC PANEL
Anion gap: 8 (ref 5–15)
BUN: 32 mg/dL — ABNORMAL HIGH (ref 8–23)
CO2: 27 mmol/L (ref 22–32)
Calcium: 9.9 mg/dL (ref 8.9–10.3)
Chloride: 106 mmol/L (ref 98–111)
Creatinine, Ser: 0.8 mg/dL (ref 0.61–1.24)
GFR, Estimated: >60 mL/min (ref >60)
Glucose, Bld: 118 mg/dL — ABNORMAL HIGH (ref 70–99)
Potassium: 4 mmol/L (ref 3.5–5.1)
Sodium: 141 mmol/L (ref 135–145)

## 2021-06-05 NOTE — Progress Notes (Signed)
PROGRESS NOTE    Rodney Kelly   BJS:[id number]  DOB: 1924/10/20  PCP: Wilkie Aye, MD    DOA: 05/19/2021 LOS: 17   Assessment & Plan   Principal Problem:   Diabetic foot infection (HCC) Active Problems:   Coronary artery disease   Type 2 diabetes mellitus with complication, without long-term current use of insulin (HCC)   Diabetic foot ulcers (HCC)   Hyperlipemia   GERD (gastroesophageal reflux disease)   Normocytic anemia   Cellulitis of right lower extremity   Protein-calorie malnutrition, severe     Right foot, cellulitis/myofasciitis with abscess,  osteomyelitis right great toe and first metatarsal bone -  Failed outpatient PO doxycycline treatment. 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 Ether Griffins) -- SNF bed available 6/30 but pt required sitter, now d/c to SNF delayed as they require 48 without a sitter in hospital.  TOC looking into alternate facilities.   Drug Rash - Macular rash on back present since at least 6/21. likely drug reaction, ID agrees. Slowly improving --monitor   Acute metabolic encephalopathy/severe hospital delirium - Iimproved.  Waxes and wanes. CT head with no acute findings. --AVOID SITTER unless needed for safety --Delirium precautions:     -Lights and TV off, minimize interruptions at night    -Blinds open and lights on during day    -Glasses/hearing aid with patient    -Frequent reorientation    -PT/OT when able    -Avoid sedation medications/Beers list medications   Aspiration pneumonia Oropharyngeal dysphagia Chest x-ray significant for right lung base opacity, on antibiotic coverage, seen closely by SLP, currently advanced to dysphagia 1 with nectar thick. S/p 5  days flagyl. Resolved. Per SLP remains high risk for aspiration. Discussed w/ son, no family interest in feeding tube (we advise against). --Continue current diet per SLP rec --Aspiration precautions --Oral hygiene  Sinus tachycardia -  Prior attending discussed EKG finding on 6/19 with Dr. Juliann Pares  who reviewed the EKG, doubt it is A. fib, Most likely sinus tachycardia with PACs versus SVT. -- Telemetry   Hypokalemia -replaced and resolved. - monitor and replace as needed   Coronary artery disease -stable, no active chest pain. S/p of stent per his son.   --Continue ASA, Tricor, Imdur   Elevated D-dimer - Lower extremity Dopplers negative for DVT, continue with Lovenox 40 mg subcu daily for prophylaxis.   Urinary retention - Foley catheter inserted 6/17, since has been removed, started on Flomax.   --Continue Flomax   Type 2 diabetes mellitus with complication of foot ulcer, without long-term current use of insulin -  Recent A1c 6.4, well controlled.  On metformin at home --Resumed home metformin --Monitor daily fasting sugar   Hyperlipemia - Fenofibrate.  Pt not taking Lipitor.    GERD - on Protonix   Normocytic anemia - most likely anemia of chronic disease      Son requested patient to be transferred to [facility], previous provider have called [facility] to requested transfer on 6/19, have discussed with internist on-call Dr. Seth Bake, given there is no indication for transfer there, and all services available at our facility, they have declined transfer especially they are at capacity, I have updated the son at  Patient BMI: Body mass  index is 19.21 kg/m.   DVT prophylaxis: enoxaparin (LOVENOX) injection 40 mg Start: 05/23/21 2100   Diet:  Diet Orders (From admission, onward)     Start     Ordered   05/29/21 1218  DIET - DYS 1 Room service appropriate? Yes with Assist; Fluid consistency: Honey Thick  Diet effective now       Comments: Extra Gravy on meats, potatoes. Yogurt  and puddings. Magic Cup. Oatmeal per Speech ok.  Question Answer Comment  Room service appropriate? Yes with Assist   Fluid consistency: Honey Thick      05/29/21 1218              Code Status: Full Code   Brief Narrative / Hospital Course to Date:   "87 y.o. male with medical history significant of DM, HLD, PCD, GERD, CAD, MI, diabetic foot ulcer, dementia who presents with foot ulcer with infection, work-up significant for osteomyelitis, patient was seen by vascular surgery, no further work-up from their side, he has been followed by podiatry.  MRI significant for osteomyelitis, patient with severe hospital delirium, on 6/17 early a.m., patient with hypoxia, new oxygen requirement and chest x-ray showing aspiration in right lung base.  Hospital course complicated by severe delirium, aspiration pneumonia with dysphagia."    Subjective 06/05/21    Patient awake up in recliner this AM.  This is first day I've seen him when awake and talking.  He denies pain or other complaints.  Says he'd like to go home.     Disposition Plan & Communication   Status is: Inpatient  Remains inpatient appropriate because:  requires SNF placement, pending.    Dispo: The patient is from: Home              Anticipated d/c is to: SNF              Patient currently is medically stable to d/c.   Difficult to place patient No    Consults, Procedures, Significant Events   Consultants:  Podiatry Vascular surgery Infectious disease  Procedures:  Right foot first ray amputation 6/24  Antimicrobials:  Anti-infectives (From admission, onward)    Start     Dose/Rate Route Frequency Ordered Stop   05/30/21 2200  doxycycline (VIBRA-TABS) tablet 100 mg        100 mg Oral Every 12 hours 05/30/21 1041 06/09/21 2359   05/30/21 2000  vancomycin (VANCOREADY) IVPB 750 mg/150 mL  Status:  Discontinued        750 mg 150 mL/hr over 60 Minutes Intravenous Every 24 hours 05/30/21 0924 05/30/21 1040    05/29/21 1800  vancomycin (VANCOCIN) IVPB 1000 mg/200 mL premix  Status:  Discontinued        1,000 mg 200 mL/hr over 60 Minutes Intravenous Every 24 hours 05/28/21 1247 05/30/21 0924   05/28/21 1800  vancomycin (VANCOREADY) IVPB 1250 mg/250 mL        1,250 mg 166.7 mL/hr over 90 Minutes Intravenous  Once 05/28/21 1247 05/28/21 1942   05/28/21 1419  vancomycin (VANCOCIN) powder  Status:  Discontinued          As needed 05/28/21 1419 05/28/21 1420   05/24/21 1400  ceFAZolin (ANCEF) IVPB 2g/100 mL premix  Status:  Discontinued        2 g 200 mL/hr over 30 Minutes Intravenous Every 8 hours 05/24/21 1116 05/26/21 1037   05/21/21 1300  vancomycin (VANCOCIN) IVPB 1000 mg/200 mL premix  Status:  Discontinued  1,000 mg 200 mL/hr over 60 Minutes Intravenous Every 24 hours 05/21/21 1238 05/21/21 1620   05/21/21 1030  ceFEPIme (MAXIPIME) 2 g in sodium chloride 0.9 % 100 mL IVPB  Status:  Discontinued        2 g 200 mL/hr over 30 Minutes Intravenous Every 12 hours 05/21/21 0932 05/24/21 1116   05/21/21 1000  metroNIDAZOLE (FLAGYL) IVPB 500 mg        500 mg 100 mL/hr over 60 Minutes Intravenous Every 8 hours 05/21/21 0914 05/25/21 1825   05/20/21 1100  vancomycin (VANCOCIN) IVPB 1000 mg/200 mL premix  Status:  Discontinued        1,000 mg 200 mL/hr over 60 Minutes Intravenous Every 24 hours 05/19/21 1033 05/21/21 1238   05/19/21 0930  vancomycin (VANCOREADY) IVPB 1250 mg/250 mL        1,250 mg 166.7 mL/hr over 90 Minutes Intravenous  Once 05/19/21 0845 05/19/21 1231   05/19/21 0900  aztreonam (AZACTAM) 2 g in sodium chloride 0.9 % 100 mL IVPB        2 g 200 mL/hr over 30 Minutes Intravenous  Once 05/19/21 0845 05/19/21 1037         Micro    Objective   Vitals:   06/04/21 1928 06/05/21 0431 06/05/21 0728 06/05/21 1528  BP: 134/60 117/65 112/61 104/62  Pulse: 84 82 83 100  Resp: 19 19 18 18   Temp: 97.7 F (36.5 C) 98 F (36.7 C) 97.6 F (36.4 C) 97.7 F (36.5 C)  TempSrc:       SpO2: 98% 96% 95% 97%  Weight:      Height:        Intake/Output Summary (Last 24 hours) at 06/05/2021 1910 Last data filed at 06/05/2021 1020 Gross per 24 hour  Intake 120 ml  Output --  Net 120 ml   Filed Weights   05/19/21 0722 05/28/21 1223  Weight: 59 kg 59 kg    Physical Exam:  General exam: up in recliner, awake, talking, no acute distress Respiratory system: CTAB, normal respiratory effort, no wheezes or rhonchi heard. Cardiovascular system: normal S1/S2, RRR, no pedal edema.   Gastrointestinal system: soft, NT, ND Extremities: right foot dressing clean dry intact, +distal sensation  Labs   Data Reviewed: I have personally reviewed following labs and imaging studies  CBC: No results for input(s): WBC, NEUTROABS, HGB, HCT, MCV, PLT in the last 168 hours.  Basic Metabolic Panel: Recent Labs  Lab 05/30/21 0413 05/31/21 0515 06/01/21 [id number] 06/02/21 0627 06/03/21 0454 06/04/21 0440 06/05/21 0520  NA 141 141 140  --  138  --  141  K 3.7 4.2 4.0  --  4.2  --  4.0  CL 103 103 102  --  103  --  106  CO2 32 33* 31  --  28  --  27  GLUCOSE 112* 133* 130* 139* 130* 130* 118*  BUN 27* 30* 26*  --  24*  --  32*  CREATININE 0.91 0.72 0.73  --  0.75  --  0.80  CALCIUM 9.3 9.4 9.6  --  9.6  --  9.9   GFR: Estimated Creatinine Clearance: 47.1 mL/min (by C-G formula based on SCr of 0.8 mg/dL). Liver Function Tests: No results for input(s): AST, ALT, ALKPHOS, BILITOT, PROT, ALBUMIN in the last 168 hours. No results for input(s): LIPASE, AMYLASE in the last 168 hours. No results for input(s): AMMONIA in the last 168 hours. Coagulation Profile: No results for input(s):  INR, PROTIME in the last 168 hours. Cardiac Enzymes: No results for input(s): CKTOTAL, CKMB, CKMBINDEX, TROPONINI in the last 168 hours. BNP (last 3 results) No results for input(s): PROBNP in the last 8760 hours. HbA1C: No results for input(s): HGBA1C in the last 72 hours. CBG: Recent Labs  Lab  05/29/21 2044  GLUCAP 177*   Lipid Profile: No results for input(s): CHOL, HDL, LDLCALC, TRIG, CHOLHDL, LDLDIRECT in the last 72 hours. Thyroid Function Tests: No results for input(s): TSH, T4TOTAL, FREET4, T3FREE, THYROIDAB in the last 72 hours. Anemia Panel: No results for input(s): VITAMINB12, FOLATE, FERRITIN, TIBC, IRON, RETICCTPCT in the last 72 hours. Sepsis Labs: No results for input(s): PROCALCITON, LATICACIDVEN in the last 168 hours.  Recent Results (from the past 240 hour(s))  Aerobic/Anaerobic Culture w Gram Stain (surgical/deep wound)     Status: Abnormal   Collection Time: 05/28/21  1:36 PM   Specimen: PATH Other; Tissue  Result Value Ref Range Status   Specimen Description   Final    WOUND Performed at [facility], [address] [place], [place] [address]    Special Requests RIGHT SESAMOID BONE  Final   Gram Stain   Final    RARE WBC PRESENT,BOTH PMN AND MONONUCLEAR RARE GRAM POSITIVE COCCI RARE GRAM POSITIVE RODS    Culture (A)  Final    MULTIPLE ORGANISMS PRESENT, NONE PREDOMINANT NO GROUP A STREP (S.PYOGENES) ISOLATED NO STAPHYLOCOCCUS AUREUS ISOLATED NO ANAEROBES ISOLATED Performed at [facility], [address], [place], [place] [address]    Report Status 06/03/2021 FINAL  Final  Resp Panel by RT-PCR (Flu A&B, Covid) Nasopharyngeal Swab     Status: None   Collection Time: 06/02/21  6:17 AM   Specimen: Nasopharyngeal Swab; Nasopharyngeal(NP) swabs in vial transport medium  Result Value Ref Range Status   SARS Coronavirus 2 by RT PCR NEGATIVE NEGATIVE Final    Comment: (NOTE) SARS-CoV-2 target nucleic acids are NOT DETECTED.  The SARS-CoV-2 RNA is generally detectable in upper respiratory specimens during the acute phase of infection. The lowest concentration of SARS-CoV-2 viral copies this assay can detect is 138 copies/mL. A negative result does not preclude SARS-Cov-2 infection and should not be used as the sole basis for  treatment or other patient management decisions. A negative result may occur with  improper specimen collection/handling, submission of specimen other than nasopharyngeal swab, presence of viral mutation(s) within the areas targeted by this assay, and inadequate number of viral copies(<138 copies/mL). A negative result must be combined with clinical observations, patient history, and epidemiological information. The expected result is Negative.  Fact Sheet for Patients:  BloggerCourse.com  Fact Sheet for Healthcare Providers:  SeriousBroker.it  This test is no t yet approved or cleared by the [place] FDA and  has been authorized for detection and/or diagnosis of SARS-CoV-2 by FDA under an Emergency Use Authorization (EUA). This EUA will remain  in effect (meaning this test can be used) for the duration of the COVID-19 declaration under Section 564(b)(1) of the Act, 21 U.S.C.section 360bbb-3(b)(1), unless the authorization is terminated  or revoked sooner.       Influenza A by PCR NEGATIVE NEGATIVE Final   Influenza B by PCR NEGATIVE NEGATIVE Final    Comment: (NOTE) The Xpert Xpress SARS-CoV-2/FLU/RSV plus assay is intended as an aid in the diagnosis of influenza from Nasopharyngeal swab specimens and should not be used as a sole basis for treatment. Nasal washings and aspirates are unacceptable for Xpert  Xpress SARS-CoV-2/FLU/RSV testing.  Fact Sheet for Patients: BloggerCourse.com  Fact Sheet for Healthcare Providers: SeriousBroker.it  This test is not yet approved or cleared by the [place] FDA and has been authorized for detection and/or diagnosis of SARS-CoV-2 by FDA under an Emergency Use Authorization (EUA). This EUA will remain in effect (meaning this test can be used) for the duration of the COVID-19 declaration under Section 564(b)(1) of the Act, 21  U.S.C. section 360bbb-3(b)(1), unless the authorization is terminated or revoked.  Performed at [facility], [address], [place], [place] [address]       Imaging Studies   No results found.   Medications   Scheduled Meds:  vitamin C  250 mg Oral BID   aspirin  81 mg Oral Daily   Chlorhexidine Gluconate Cloth  6 each Topical Daily   doxycycline  100 mg Oral Q12H   enoxaparin (LOVENOX) injection  40 mg Subcutaneous Q24H   fenofibrate  160 mg Oral Daily   metFORMIN  1,000 mg Oral BID WC   multivitamin with minerals  1 tablet Oral Daily   mupirocin ointment   Topical Daily   pantoprazole  40 mg Oral Daily   sodium chloride flush  10 mL Intravenous Q12H   tamsulosin  0.4 mg Oral QPC supper   Continuous Infusions:     LOS: 17 days    Time spent: 20 minutes     Pennie Banter, DO [facility]  06/05/2021, 7:10 PM      If 7PM-7AM, please contact night-coverage. How to contact the [facility] Attending or Consulting provider 7A - 7P or covering provider during after hours 7P -7A, for this patient?    Check the care team in [facility] and look for a) attending/consulting [facility] provider listed and b) the [facility] team listed Log into [email or web address] and use [place]'s universal password to access. If you do not have the password, please contact the hospital operator. Locate the [facility] provider you are looking for under [facility] and page to a number that you can be directly reached. If you still have difficulty reaching the provider, please page the [facility] (Director on Call) for the Hospitalists listed on amion for assistance.

## 2021-06-06 LAB — GLUCOSE, RANDOM: Glucose, Bld: 115 mg/dL — ABNORMAL HIGH (ref 70–99)

## 2021-06-06 NOTE — Progress Notes (Signed)
PROGRESS NOTE    Rodney Kelly   JGG:[id number]  DOB: 1924-11-29  PCP: Wilkie Aye, MD    DOA: 05/19/2021 LOS: 18   Assessment & Plan   Principal Problem:   Diabetic foot infection (HCC) Active Problems:   Coronary artery disease   Type 2 diabetes mellitus with complication, without long-term current use of insulin (HCC)   Diabetic foot ulcers (HCC)   Hyperlipemia   GERD (gastroesophageal reflux disease)   Normocytic anemia   Cellulitis of right lower extremity   Protein-calorie malnutrition, severe     Right foot, cellulitis/myofasciitis with abscess,  osteomyelitis right great toe and first metatarsal bone -  Failed outpatient PO doxycycline treatment. 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 Ether Griffins) -- SNF bed available 6/30 but pt required sitter,  d/c to SNF delayed as they require 48 without a sitter in hospital.  Expect d/c Mon or Tues given holiday.   Drug Rash - Macular rash on back present since at least 6/21. likely drug reaction, ID agrees. Resolved. --monitor   Acute metabolic encephalopathy/severe hospital delirium - Iimproved.  Waxes and wanes. CT head with no acute findings. --AVOID SITTER unless needed for safety --Delirium precautions:     -Lights and TV off, minimize interruptions at night    -Blinds open and lights on during day    -Glasses/hearing aid with patient    -Frequent reorientation    -PT/OT when able    -Avoid sedation medications/Beers list medications   Aspiration pneumonia Oropharyngeal dysphagia Chest x-ray significant for right lung base opacity, on antibiotic coverage, seen closely by SLP, currently advanced to dysphagia 1 with nectar thick. S/p 5 days  flagyl. Resolved. Per SLP remains high risk for aspiration. Discussed w/ son, no family interest in feeding tube (we advise against). --Continue current diet per SLP rec --Aspiration precautions --Oral hygiene  Sinus tachycardia -  Prior attending discussed EKG finding on 6/19 with Dr. Juliann Pares  who reviewed the EKG, doubt it is A. fib, Most likely sinus tachycardia with PACs versus SVT.   Hypokalemia -replaced and resolved. - monitor and replace as needed   Coronary artery disease -stable, no active chest pain. S/p of stent per his son.   --Continue ASA, Tricor, Imdur   Elevated D-dimer - Lower extremity Dopplers negative for DVT, continue with Lovenox 40 mg subcu daily for prophylaxis.   Urinary retention - Foley catheter inserted 6/17, since has been removed, started on Flomax.   --Continue Flomax   Type 2 diabetes mellitus with complication of foot ulcer, without long-term current use of insulin -  Recent A1c 6.4, well controlled.  On metformin at home --Resumed home metformin --Monitor daily fasting sugar   Hyperlipemia - Fenofibrate.  Pt not taking Lipitor.    GERD - on Protonix   Normocytic anemia - most likely anemia of chronic disease      Son requested patient to be transferred to [facility], previous provider have called [facility] to requested transfer on 6/19, have discussed with internist on-call Dr. Seth Bake, given there is no indication for transfer there, and all services available at our facility, they have declined transfer especially they are at capacity, I have updated the son at  Patient BMI: Body mass index  is 19.21 kg/m.   DVT prophylaxis: enoxaparin (LOVENOX) injection 40 mg Start: 05/23/21 2100   Diet:  Diet Orders (From admission, onward)     Start     Ordered   05/29/21 1218  DIET - DYS 1 Room service appropriate? Yes with Assist; Fluid consistency: Honey Thick  Diet effective now       Comments: Extra Gravy on meats, potatoes. Yogurt and puddings.  Magic Cup. Oatmeal per Speech ok.  Question Answer Comment  Room service appropriate? Yes with Assist   Fluid consistency: Honey Thick      05/29/21 1218              Code Status: Full Code   Brief Narrative / Hospital Course to Date:   "87 y.o. male with medical history significant of DM, HLD, PCD, GERD, CAD, MI, diabetic foot ulcer, dementia who presents with foot ulcer with infection, work-up significant for osteomyelitis, patient was seen by vascular surgery, no further work-up from their side, he has been followed by podiatry.  MRI significant for osteomyelitis, patient with severe hospital delirium, on 6/17 early a.m., patient with hypoxia, new oxygen requirement and chest x-ray showing aspiration in right lung base.  Hospital course complicated by severe delirium, aspiration pneumonia with dysphagia."    Subjective 06/06/21    Patient awake sitting up in bed today.  He says feeling well.  Not feeling sick with N/V, cough, fever chills.  No chest pain or SOB.  No acute events reported.   Disposition Plan & Communication   Status is: Inpatient  Remains inpatient appropriate because:  requires SNF placement, pending.    Dispo: The patient is from: Home              Anticipated d/c is to: SNF              Patient currently is medically stable to d/c.   Difficult to place patient No    Consults, Procedures, Significant Events   Consultants:  Podiatry Vascular surgery Infectious disease  Procedures:  Right foot first ray amputation 6/24  Antimicrobials:  Anti-infectives (From admission, onward)    Start     Dose/Rate Route Frequency Ordered Stop   05/30/21 2200  doxycycline (VIBRA-TABS) tablet 100 mg        100 mg Oral Every 12 hours 05/30/21 1041 06/09/21 2359   05/30/21 2000  vancomycin (VANCOREADY) IVPB 750 mg/150 mL  Status:  Discontinued        750 mg 150 mL/hr over 60 Minutes Intravenous Every 24 hours 05/30/21 0924 05/30/21 1040   05/29/21 1800   vancomycin (VANCOCIN) IVPB 1000 mg/200 mL premix  Status:  Discontinued        1,000 mg 200 mL/hr over 60 Minutes Intravenous Every 24 hours 05/28/21 1247 05/30/21 0924   05/28/21 1800  vancomycin (VANCOREADY) IVPB 1250 mg/250 mL        1,250 mg 166.7 mL/hr over 90 Minutes Intravenous  Once 05/28/21 1247 05/28/21 1942   05/28/21 1419  vancomycin (VANCOCIN) powder  Status:  Discontinued          As needed 05/28/21 1419 05/28/21 1420   05/24/21 1400  ceFAZolin (ANCEF) IVPB 2g/100 mL premix  Status:  Discontinued        2 g 200 mL/hr over 30 Minutes Intravenous Every 8 hours 05/24/21 1116 05/26/21 1037   05/21/21 1300  vancomycin (VANCOCIN) IVPB 1000 mg/200 mL premix  Status:  Discontinued  1,000 mg 200 mL/hr over 60 Minutes Intravenous Every 24 hours 05/21/21 1238 05/21/21 1620   05/21/21 1030  ceFEPIme (MAXIPIME) 2 g in sodium chloride 0.9 % 100 mL IVPB  Status:  Discontinued        2 g 200 mL/hr over 30 Minutes Intravenous Every 12 hours 05/21/21 0932 05/24/21 1116   05/21/21 1000  metroNIDAZOLE (FLAGYL) IVPB 500 mg        500 mg 100 mL/hr over 60 Minutes Intravenous Every 8 hours 05/21/21 0914 05/25/21 1825   05/20/21 1100  vancomycin (VANCOCIN) IVPB 1000 mg/200 mL premix  Status:  Discontinued        1,000 mg 200 mL/hr over 60 Minutes Intravenous Every 24 hours 05/19/21 1033 05/21/21 1238   05/19/21 0930  vancomycin (VANCOREADY) IVPB 1250 mg/250 mL        1,250 mg 166.7 mL/hr over 90 Minutes Intravenous  Once 05/19/21 0845 05/19/21 1231   05/19/21 0900  aztreonam (AZACTAM) 2 g in sodium chloride 0.9 % 100 mL IVPB        2 g 200 mL/hr over 30 Minutes Intravenous  Once 05/19/21 0845 05/19/21 1037         Micro    Objective   Vitals:   06/05/21 2038 06/06/21 0419 06/06/21 0742 06/06/21 1531  BP: 131/83 119/65 105/66 115/71  Pulse: 92 82 86 82  Resp: 18 17 18 16   Temp: 98 F (36.7 C) 97.9 F (36.6 C) 97.7 F (36.5 C) 97.8 F (36.6 C)  TempSrc:      SpO2: 100% 97%  100% 99%  Weight:      Height:        Intake/Output Summary (Last 24 hours) at 06/06/2021 1715 Last data filed at 06/06/2021 1038 Gross per 24 hour  Intake 10 ml  Output 300 ml  Net -290 ml   Filed Weights   05/19/21 0722 05/28/21 1223  Weight: 59 kg 59 kg    Physical Exam:  General exam: awake sitting up in bed today, awake, talking, no acute distress Respiratory system: CTAB, on room air, normal respiratory effort, no wheezes or rhonchi heard. Cardiovascular system: normal S1/S2, RRR, no pedal edema.   Gastrointestinal system: soft, NT, ND Extremities: right foot dressing clean dry intact, +distal sensation and toes normal temp  Labs   Data Reviewed: I have personally reviewed following labs and imaging studies  CBC: No results for input(s): WBC, NEUTROABS, HGB, HCT, MCV, PLT in the last 168 hours.  Basic Metabolic Panel: Recent Labs  Lab 05/31/21 0515 06/01/21 0733 06/02/21 0627 06/03/21 0454 06/04/21 0440 06/05/21 0520 06/06/21 0406  NA 141 140  --  138  --  141  --   K 4.2 4.0  --  4.2  --  4.0  --   CL 103 102  --  103  --  106  --   CO2 33* 31  --  28  --  27  --   GLUCOSE 133* 130* 139* 130* 130* 118* 115*  BUN 30* 26*  --  24*  --  32*  --   CREATININE 0.72 0.73  --  0.75  --  0.80  --   CALCIUM 9.4 9.6  --  9.6  --  9.9  --    GFR: Estimated Creatinine Clearance: 47.1 mL/min (by C-G formula based on SCr of 0.8 mg/dL). Liver Function Tests: No results for input(s): AST, ALT, ALKPHOS, BILITOT, PROT, ALBUMIN in the last 168 hours. No results  for input(s): LIPASE, AMYLASE in the last 168 hours. No results for input(s): AMMONIA in the last 168 hours. Coagulation Profile: No results for input(s): INR, PROTIME in the last 168 hours. Cardiac Enzymes: No results for input(s): CKTOTAL, CKMB, CKMBINDEX, TROPONINI in the last 168 hours. BNP (last 3 results) No results for input(s): PROBNP in the last 8760 hours. HbA1C: No results for input(s): HGBA1C in the  last 72 hours. CBG: No results for input(s): GLUCAP in the last 168 hours.  Lipid Profile: No results for input(s): CHOL, HDL, LDLCALC, TRIG, CHOLHDL, LDLDIRECT in the last 72 hours. Thyroid Function Tests: No results for input(s): TSH, T4TOTAL, FREET4, T3FREE, THYROIDAB in the last 72 hours. Anemia Panel: No results for input(s): VITAMINB12, FOLATE, FERRITIN, TIBC, IRON, RETICCTPCT in the last 72 hours. Sepsis Labs: No results for input(s): PROCALCITON, LATICACIDVEN in the last 168 hours.  Recent Results (from the past 240 hour(s))  Aerobic/Anaerobic Culture w Gram Stain (surgical/deep wound)     Status: Abnormal   Collection Time: 05/28/21  1:36 PM   Specimen: PATH Other; Tissue  Result Value Ref Range Status   Specimen Description   Final    WOUND Performed at [facility], [address] [place], [place] [address]    Special Requests RIGHT SESAMOID BONE  Final   Gram Stain   Final    RARE WBC PRESENT,BOTH PMN AND MONONUCLEAR RARE GRAM POSITIVE COCCI RARE GRAM POSITIVE RODS    Culture (A)  Final    MULTIPLE ORGANISMS PRESENT, NONE PREDOMINANT NO GROUP A STREP (S.PYOGENES) ISOLATED NO STAPHYLOCOCCUS AUREUS ISOLATED NO ANAEROBES ISOLATED Performed at [facility], [address], [place], [place] [address]    Report Status 06/03/2021 FINAL  Final  Resp Panel by RT-PCR (Flu A&B, Covid) Nasopharyngeal Swab     Status: None   Collection Time: 06/02/21  6:17 AM   Specimen: Nasopharyngeal Swab; Nasopharyngeal(NP) swabs in vial transport medium  Result Value Ref Range Status   SARS Coronavirus 2 by RT PCR NEGATIVE NEGATIVE Final    Comment: (NOTE) SARS-CoV-2 target nucleic acids are NOT DETECTED.  The SARS-CoV-2 RNA is generally detectable in upper respiratory specimens during the acute phase of infection. The lowest concentration of SARS-CoV-2 viral copies this assay can detect is 138 copies/mL. A negative result does not preclude SARS-Cov-2 infection and  should not be used as the sole basis for treatment or other patient management decisions. A negative result may occur with  improper specimen collection/handling, submission of specimen other than nasopharyngeal swab, presence of viral mutation(s) within the areas targeted by this assay, and inadequate number of viral copies(<138 copies/mL). A negative result must be combined with clinical observations, patient history, and epidemiological information. The expected result is Negative.  Fact Sheet for Patients:  BloggerCourse.com  Fact Sheet for Healthcare Providers:  SeriousBroker.it  This test is no t yet approved or cleared by the [place] FDA and  has been authorized for detection and/or diagnosis of SARS-CoV-2 by FDA under an Emergency Use Authorization (EUA). This EUA will remain  in effect (meaning this test can be used) for the duration of the COVID-19 declaration under Section 564(b)(1) of the Act, 21 U.S.C.section 360bbb-3(b)(1), unless the authorization is terminated  or revoked sooner.       Influenza A by PCR NEGATIVE NEGATIVE Final   Influenza B by PCR NEGATIVE NEGATIVE Final    Comment: (NOTE) The Xpert Xpress SARS-CoV-2/FLU/RSV plus assay is intended as an aid in the diagnosis  of influenza from Nasopharyngeal swab specimens and should not be used as a sole basis for treatment. Nasal washings and aspirates are unacceptable for Xpert Xpress SARS-CoV-2/FLU/RSV testing.  Fact Sheet for Patients: BloggerCourse.comhttps://www.fda.gov/media/152166/download  Fact Sheet for Healthcare Providers: SeriousBroker.ithttps://www.fda.gov/media/152162/download  This test is not yet approved or cleared by the [place] FDA and has been authorized for detection and/or diagnosis of SARS-CoV-2 by FDA under an Emergency Use Authorization (EUA). This EUA will remain in effect (meaning this test can be used) for the duration of the COVID-19 declaration  under Section 564(b)(1) of the Act, 21 U.S.C. section 360bbb-3(b)(1), unless the authorization is terminated or revoked.  Performed at [facility], [address] [place], [place] [address]       Imaging Studies   No results found.   Medications   Scheduled Meds:  vitamin C  250 mg Oral BID   aspirin  81 mg Oral Daily   Chlorhexidine Gluconate Cloth  6 each Topical Daily   doxycycline  100 mg Oral Q12H   enoxaparin (LOVENOX) injection  40 mg Subcutaneous Q24H   fenofibrate  160 mg Oral Daily   metFORMIN  1,000 mg Oral BID WC   multivitamin with minerals  1 tablet Oral Daily   mupirocin ointment   Topical Daily   pantoprazole  40 mg Oral Daily   sodium chloride flush  10 mL Intravenous Q12H   tamsulosin  0.4 mg Oral QPC supper   Continuous Infusions:     LOS: 18 days    Time spent: 20 minutes     Pennie BanterKelly A Louanna Vanliew, DO [facility]  06/06/2021, 5:15 PM      If 7PM-7AM, please contact night-coverage. How to contact the [facility] Attending or Consulting provider 7A - 7P or covering provider during after hours 7P -7A, for this patient?    Check the care team in [facility] and look for a) attending/consulting [facility] provider listed and b) the [facility] team listed Log into [email or web address] and use [place]'s universal password to access. If you do not have the password, please contact the hospital operator. Locate the [facility] provider you are looking for under [facility] and page to a number that you can be directly reached. If you still have difficulty reaching the provider, please page the [facility] (Director on Call) for the Hospitalists listed on amion for assistance.

## 2021-06-07 LAB — BASIC METABOLIC PANEL
Anion gap: 6 (ref 5–15)
BUN: 35 mg/dL — ABNORMAL HIGH (ref 8–23)
CO2: 29 mmol/L (ref 22–32)
Calcium: 9.9 mg/dL (ref 8.9–10.3)
Chloride: 106 mmol/L (ref 98–111)
Creatinine, Ser: 0.87 mg/dL (ref 0.61–1.24)
GFR, Estimated: >60 mL/min (ref >60)
Glucose, Bld: 124 mg/dL — ABNORMAL HIGH (ref 70–99)
Potassium: 3.7 mmol/L (ref 3.5–5.1)
Sodium: 141 mmol/L (ref 135–145)

## 2021-06-07 LAB — RESP PANEL BY RT-PCR (FLU A&B, COVID) ARPGX2
Influenza A by PCR: NEGATIVE
Influenza B by PCR: NEGATIVE
SARS Coronavirus 2 by RT PCR: NEGATIVE

## 2021-06-07 MED ORDER — ADULT MULTIVITAMIN W/MINERALS CH: 1.0000 | ORAL_TABLET | Freq: Every day | ORAL | Status: AC

## 2021-06-07 MED ORDER — DOXYCYCLINE HYCLATE 100 MG PO TABS: 100.0000 mg | ORAL_TABLET | Freq: Two times a day (BID) | ORAL | 0 refills | Status: AC

## 2021-06-07 MED ORDER — HYDROCODONE-ACETAMINOPHEN 5-325 MG PO TABS: 1.0000 | ORAL_TABLET | Freq: Four times a day (QID) | ORAL | 0 refills | Status: AC | PRN

## 2021-06-07 MED ORDER — DOXYCYCLINE HYCLATE 100 MG PO TABS: 100.0000 mg | ORAL_TABLET | Freq: Two times a day (BID) | ORAL | 0 refills | Status: DC

## 2021-06-07 MED ORDER — ASCORBIC ACID 250 MG PO TABS: 250.0000 mg | ORAL_TABLET | Freq: Two times a day (BID) | ORAL | Status: AC

## 2021-06-07 MED ORDER — MUPIROCIN 2 % EX OINT: TOPICAL_OINTMENT | Freq: Every day | CUTANEOUS | 0 refills | Status: AC

## 2021-06-07 MED ORDER — TAMSULOSIN HCL 0.4 MG PO CAPS: 0.4000 mg | ORAL_CAPSULE | Freq: Every day | ORAL | Status: AC

## 2021-06-07 NOTE — Care Management Important Message (Signed)
Important Message  Patient Details  Name: Rodney Kelly MRN: [id number] Date of Birth: 06/17/1924   Medicare Important Message Given:  Yes     Johnell Comings 06/07/2021, 11:25 AM

## 2021-06-07 NOTE — Discharge Summary (Addendum)
Physician Discharge Summary  Rodney Kelly ZOX:[id number] DOB: 04-25-1924 DOA: 05/19/2021  PCP: Wilkie Aye, MD  Admit date: 05/19/2021 Discharge date: 06/08/2021  Admitted From: home Disposition:  SNF  Recommendations for Outpatient Follow-up:  Follow up with PCP in 1-2 weeks Please obtain BMP/CBC in one week Follow-up with podiatry in 2 weeks in the outpatient clinic.  Home Health: No Equipment/Devices: None   Discharge Condition: Stable  CODE STATUS: Full    Diet recommendation: Dysphagia 1 - pureed, Honey thickened liquids Extra Gravy on meats, potatoes. Yogurt and puddings. Magic Cup. Oatmeal ok.  Discharge Diagnoses: Principal Problem:   Diabetic foot infection (HCC) Active Problems:   Coronary artery disease   Type 2 diabetes mellitus with complication, without long-term current use of insulin (HCC)   Diabetic foot ulcers (HCC)   Hyperlipemia   GERD (gastroesophageal reflux disease)   Normocytic anemia   Cellulitis of right lower extremity   Protein-calorie malnutrition, severe    Summary of HPI and Hospital Course:  "87 y.o. male with medical history significant of DM, HLD, PCD, GERD, CAD, MI, diabetic foot ulcer, dementia who presents with foot ulcer with infection, work-up significant for osteomyelitis, patient was seen by vascular surgery, no further work-up from their side, he has been followed by podiatry.  MRI significant for osteomyelitis, patient with severe hospital delirium, on 6/17 early a.m., patient with hypoxia, new oxygen requirement and chest x-ray showing aspiration in right lung base.  Hospital course complicated by severe delirium, aspiration pneumonia with dysphagia."  Patient has been medically stable and delirium resolved now for several days.  Ready for discharge to SNF / rehab today.    Right foot, cellulitis/myofasciitis with abscess Osteomyelitis right great toe and first metatarsal bone - Failed outpatient PO doxycycline  treatment. 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 Ether Griffins)      Acute metabolic encephalopathy/severe hospital delirium - Resolved.   CT head with no acute findings.  Due to hospital environment and infection. Patient's mentation has returned to baseline. --Delirium precautions:    -Lights and TV off, minimize interruptions at night    -Blinds open and lights on during day    -Glasses/hearing aid with patient    -Frequent reorientation    -Avoid sedation medications/Beers list medications   Aspiration pneumonia - resolved Oropharyngeal dysphagia - chronic Chest x-ray significant for right lung base opacity, on antibiotic coverage. Seen by SLP, advanced to dysphagia 1 with honey thick.  Completed 5 days Antibiotics.  Per SLP remains high risk for aspiration.  This was discussed w/ son, no family interest in feeding tube (we advise against). --Continue current diet per SLP rec (as above) --Aspiration precautions --Oral hygiene  Sinus tachycardia - resolved Prior attending discussed EKG finding on 6/19 with Dr. Juliann Pares  who reviewed the EKG, doubt it is A. fib, Most likely sinus tachycardia with PACs versus SVT.   Hypokalemia -replaced and resolved. - monitor and replace as needed   Coronary artery disease -stable, no active chest pain. S/p of stent per his son.   --Continue ASA, Tricor, Imdur   Elevated D-dimer - Lower extremity Dopplers negative for DVT, continue with Lovenox 40 mg subcu daily for prophylaxis.   Urinary retention - Foley catheter inserted 6/17, since has been removed, started on Flomax.   --  Continue Flomax   Type 2 diabetes mellitus with complication of foot ulcer, without long-term current use of insulin - Recent A1c 6.4, well controlled.  On  metformin at home --Resumed home metformin --Monitor daily fasting sugar   Hyperlipemia - Fenofibrate.  Pt not taking Lipitor.   GERD - on Protonix   Normocytic anemia - most likely anemia of chronic disease    Discharge Instructions   Discharge Instructions     Call MD for:  extreme fatigue   Complete by: As directed    Call MD for:  persistant dizziness or light-headedness   Complete by: As directed    Call MD for:  persistant nausea and vomiting   Complete by: As directed    Call MD for:  severe uncontrolled pain   Complete by: As directed    Call MD for:  temperature >100.4   Complete by: As directed    Diet - low sodium heart healthy   Complete by: As directed    Discharge wound care:   Complete by: As directed    Apply Bactroban to left heel and right foot wounds Q day, then cover with 2X2 and foam dressing.   (Change foam dressings Q 3 days or PRN soiling.)   Increase activity slowly   Complete by: As directed       Allergies as of 06/07/2021       Reactions   Penicillins Other (See Comments)   unknown   Sulfa Antibiotics Other (See Comments)   unknown   Cephalosporins Rash   Developed maculopapular rash following treatment with cefepime to cefazolin June 2022        Medication List     STOP taking these medications    alendronate 70 MG tablet Commonly known as: FOSAMAX   atorvastatin 20 MG tablet Commonly known as: LIPITOR   sertraline 50 MG tablet Commonly known as: ZOLOFT       TAKE these medications    acetaminophen 500 MG tablet Commonly known as: TYLENOL Take 1,000 mg by mouth every 8 (eight) hours as needed for moderate pain.   albuterol 108 (90 Base) MCG/ACT inhaler Commonly known as: VENTOLIN HFA Inhale 2 puffs into the lungs every 6 (six) hours as needed for wheezing or shortness of breath.   ascorbic acid 250 MG tablet Commonly known as: VITAMIN C Take 1 tablet (250 mg total) by mouth 2 (two) times daily.   aspirin 81 MG  chewable tablet Chew 81 mg by mouth daily.   doxycycline 100 MG tablet Commonly known as: VIBRA-TABS Take 1 tablet (100 mg total) by mouth every 12 (twelve) hours for 2 days.   fenofibrate 145 MG tablet Commonly known as: TRICOR Take 145 mg by mouth every evening. What changed: Another medication with the same name was removed. Continue taking this medication, and follow the directions you see here.   HYDROcodone-acetaminophen 5-325 MG tablet Commonly known as: Norco Take 1 tablet by mouth every 6 (six) hours as needed for up to 5 days (pain despite Tylenol). What changed: reasons to take this   isosorbide mononitrate 30 MG 24 hr tablet Commonly known as: IMDUR Take by mouth.   metFORMIN 1000 MG tablet Commonly known as: GLUCOPHAGE Take 1,000 mg by mouth 2 (two) times daily with a meal.   multivitamin with minerals Tabs tablet Take 1 tablet by mouth daily.   mupirocin ointment 2 % Commonly known as: BACTROBAN Apply topically daily.   nitroGLYCERIN 0.4 MG SL tablet Commonly known  as: NITROSTAT Place 0.4 mg under the tongue every 5 (five) minutes as needed for chest pain.   omeprazole 40 MG capsule Commonly known as: PRILOSEC Take 40 mg by mouth daily.   pregabalin 75 MG capsule Commonly known as: LYRICA Take 75 mg by mouth 2 (two) times daily.   tamsulosin 0.4 MG Caps capsule Commonly known as: FLOMAX Take 1 capsule (0.4 mg total) by mouth daily after supper.               Discharge Care Instructions  (From admission, onward)           Start     Ordered   06/07/21 0000  Discharge wound care:       Comments: Apply Bactroban to left heel and right foot wounds Q day, then cover with 2X2 and foam dressing.   (Change foam dressings Q 3 days or PRN soiling.)   06/07/21 1324            Follow-up Information     Gwyneth Revels, DPM. Schedule an appointment as soon as possible for a visit in 1 week(s).   Specialty: Podiatry Why: Needs 2 week follow  up Contact information: [address] [place] [address] [phone]         Wilkie Aye, MD. Schedule an appointment as soon as possible for a visit in 1 week(s).   Specialty: Family Medicine Why: Hospital follow up Contact information: [address] [place] [address] [phone]                Allergies  Allergen Reactions   Penicillins Other (See Comments)    unknown   Sulfa Antibiotics Other (See Comments)    unknown   Cephalosporins Rash    Developed maculopapular rash following treatment with cefepime to cefazolin June 2022     If you experience worsening of your admission symptoms, develop shortness of breath, life threatening emergency, suicidal or homicidal thoughts you must seek medical attention immediately by calling 911 or calling your MD immediately  if symptoms less severe.    Please note   You were cared for by a hospitalist during your hospital stay. If you have any questions about your discharge medications or the care you received while you were in the hospital after you are discharged, you can call the unit and asked to speak with the hospitalist on call if the hospitalist that took care of you is not available. Once you are discharged, your primary care physician will handle any further medical issues. Please note that NO REFILLS for any discharge medications will be authorized once you are discharged, as it is imperative that you return to your primary care physician (or establish a relationship with a primary care physician if you do not have one) for your aftercare needs so that they can reassess your need for medications and monitor your lab values.   Consultations: Podiatry Infectious disease Cardiology  Vascular surgery    Procedures/Studies: CT HEAD WO CONTRAST  Result Date: 05/22/2021 CLINICAL DATA:  Delirium. EXAM: CT HEAD WITHOUT CONTRAST TECHNIQUE: Contiguous axial images were obtained from the  base of the skull through the vertex without intravenous contrast. COMPARISON:  None FINDINGS: Brain: Mild atrophy and white matter changes are within normal limits for age. No acute infarct, hemorrhage, or mass lesion is present. The ventricles are of normal size. No significant extraaxial fluid collection is present. The brainstem and cerebellum are within normal limits. Vascular: Atherosclerotic calcifications  present within the cavernous internal carotid arteries bilaterally. No hyperdense vessel is present. Skull: Calvarium is intact. No focal lytic or blastic lesions are present. No significant extracranial soft tissue lesion is present. Sinuses/Orbits: The paranasal sinuses and mastoid air cells are clear. Bilateral lens replacements are noted. Globes and orbits are otherwise unremarkable. IMPRESSION: Normal CT appearance of the brain for age. No acute or focal abnormality to explain the patient's delirium. Electronically Signed   By: Marin Roberts M.D.   On: 05/22/2021 14:53   MR FOOT RIGHT W WO CONTRAST  Result Date: 05/19/2021 CLINICAL DATA:  Foot pain and swelling. Open wound on the plantar aspect great toe. EXAM: MRI OF THE RIGHT FOREFOOT WITHOUT AND WITH CONTRAST TECHNIQUE: Multiplanar, multisequence MR imaging of the right foot was performed before and after the administration of intravenous contrast. CONTRAST:  66mL GADAVIST GADOBUTROL 1 MMOL/ML IV SOLN COMPARISON:  None. FINDINGS: There is a small open wound on the plantar aspect of the medial forefoot at the level of the first MTP joint. There is a small rim enhancing abscess measuring approximately 15 x 13. Associated osteomyelitis involving medial sesamoid of the great toe. Abnormal T1 and T2 signal intensity and enhancement in the proximal phalanx of the great toe consistent with osteomyelitis. I do not see any definite findings for septic arthritis at the first MTP joint. Suspect osteomyelitis involving first metatarsal head along its  plantar aspect. The other bony structures are intact. Diffuse cellulitis myofasciitis without definite findings for pyomyositis. IMPRESSION: 1. Small open wound on the plantar aspect of the medial forefoot at the level of the first MTP joint. Associated underlying soft tissue abscess. 2. Associated osteomyelitis involving the medial sesamoid of the great toe and also the proximal phalanx of the great toe. 3. Suspect osteomyelitis involving the first metatarsal head along its plantar aspect. 4. Diffuse cellulitis and myofasciitis without definite findings pyomyositis. Electronically Signed   By: Rudie Meyer M.D.   On: 05/19/2021 14:37   US Venous Img Lower Unilateral Left (DVT)  Result Date: 05/24/2021 CLINICAL DATA:  Elevated D-dimer.  Left lower extremity edema. EXAM: LEFT LOWER EXTREMITY VENOUS DOPPLER ULTRASOUND TECHNIQUE: Gray-scale sonography with graded compression, as well as color Doppler and duplex ultrasound were performed to evaluate the lower extremity deep venous systems from the level of the common femoral vein and including the common femoral, femoral, profunda femoral, popliteal and calf veins including the posterior tibial, peroneal and gastrocnemius veins when visible. The superficial great saphenous vein was also interrogated. Spectral Doppler was utilized to evaluate flow at rest and with distal augmentation maneuvers in the common femoral, femoral and popliteal veins. COMPARISON:  None. FINDINGS: Contralateral Common Femoral Vein: Respiratory phasicity is normal and symmetric with the symptomatic side. No evidence of thrombus. Normal compressibility. Common Femoral Vein: No evidence of thrombus. Normal compressibility, respiratory phasicity and response to augmentation. Saphenofemoral Junction: No evidence of thrombus. Normal compressibility and flow on color Doppler imaging. Profunda Femoral Vein: No evidence of thrombus. Normal compressibility and flow on color Doppler imaging. Femoral  Vein: No evidence of thrombus. Normal compressibility, respiratory phasicity and response to augmentation. Popliteal Vein: No evidence of thrombus. Normal compressibility, respiratory phasicity and response to augmentation. Calf Veins: No evidence of thrombus. Normal compressibility and flow on color Doppler imaging. Other Findings:  None. IMPRESSION: Negative for deep venous thrombosis in left lower extremity. Electronically Signed   By: Richarda Overlie M.D.   On: 05/24/2021 07:56   US Venous Img Lower Unilateral Right (  DVT)  Result Date: 05/19/2021 CLINICAL DATA:  Lower extremity edema EXAM: RIGHT LOWER EXTREMITY VENOUS DUPLEX ULTRASOUND TECHNIQUE: Gray-scale sonography with graded compression, as well as color Doppler and duplex ultrasound were performed to evaluate the right lower extremity deep venous system from the level of the common femoral vein and including the common femoral, femoral, profunda femoral, popliteal and calf veins including the posterior tibial, peroneal and gastrocnemius veins when visible. The superficial great saphenous vein was also interrogated. Spectral Doppler was utilized to evaluate flow at rest and with distal augmentation maneuvers in the common femoral, femoral and popliteal veins. COMPARISON:  None. FINDINGS: Contralateral Common Femoral Vein: Respiratory phasicity is normal and symmetric with the symptomatic side. No evidence of thrombus. Normal compressibility. Common Femoral Vein: No evidence of thrombus. Normal compressibility, respiratory phasicity and response to augmentation. Saphenofemoral Junction: No evidence of thrombus. Normal compressibility and flow on color Doppler imaging. Profunda Femoral Vein: No evidence of thrombus. Normal compressibility and flow on color Doppler imaging. Femoral Vein: No evidence of thrombus. Normal compressibility, respiratory phasicity and response to augmentation. Popliteal Vein: No evidence of thrombus. Normal compressibility,  respiratory phasicity and response to augmentation. Calf Veins: No evidence of thrombus. Normal compressibility and flow on color Doppler imaging. Superficial Great Saphenous Vein: No evidence of thrombus. Normal compressibility. Venous Reflux:  None. Other Findings:  None. IMPRESSION: No evidence of deep venous thrombosis in the right lower extremity. Left common femoral vein also patent. Electronically Signed   By: Bretta BangWilliam  Woodruff III M.D.   On: 05/19/2021 12:09   DG Chest Port 1 View  Result Date: 05/21/2021 CLINICAL DATA:  Aspiration into the airway EXAM: PORTABLE CHEST 1 VIEW COMPARISON:  None. FINDINGS: Coarse reticular opacities and airways thickening present in the right lung base. The more mild reticular changes seen elsewhere in the lungs. No pneumothorax or visible effusion. Biapical pleuroparenchymal scarring. The aorta is calcified. The remaining cardiomediastinal contours are unremarkable. No acute osseous or soft tissue abnormality. Degenerative changes are present in the imaged spine and shoulders. Multiple surgical clips at base of the neck, a prior thyroidectomy. IMPRESSION: Coarse reticular changes are present in the right lung base with additional airways thickening. Could reflect sequela of aspiration in the appropriate clinical setting. The more diffusely increased interstitial markings elsewhere are nonspecific could reflect findings of an underlying interstitial disease. Aortic Atherosclerosis (ICD10-I70.0). Electronically Signed   By: Kreg ShropshirePrice  DeHay M.D.   On: 05/21/2021 05:16   ECHOCARDIOGRAM COMPLETE  Result Date: 05/23/2021    ECHOCARDIOGRAM REPORT   Patient Name:   Rodney Kelly Date of Exam: 05/23/2021 Medical Rec #:  [id number]                Height:       69.0 in Accession #:    [id number]               Weight:       130.0 lb Date of Birth:  1924-01-31                BSA:          1.720 m Patient Age:    87 years                 BP:           108/57 mmHg Patient  Gender: M                        HR:  112 bpm. Exam Location:  [facility] Procedure: 2D Echo Indications:     Atrial Fibrillation  History:         Patient has no prior history of Echocardiogram examinations.                  CAD.  Sonographer:     L. Thornton-Maynard Referring Phys:  [id number] BRENDA MORRISON Diagnosing Phys: Alwyn Pea MD  Sonographer Comments: Test terminated due to pt's dementia state combativeness. IMPRESSIONS  1. Left ventricular ejection fraction, by estimation, is 60 to 65%. The left ventricle has normal function. The left ventricle has no regional wall motion abnormalities. Left ventricular diastolic parameters were normal.  2. Right ventricular systolic function is normal. The right ventricular size is normal. There is normal pulmonary artery systolic pressure.  3. The mitral valve is normal in structure. No evidence of mitral valve regurgitation.  4. The aortic valve is normal in structure. Aortic valve regurgitation is not visualized. FINDINGS  Left Ventricle: Left ventricular ejection fraction, by estimation, is 60 to 65%. The left ventricle has normal function. The left ventricle has no regional wall motion abnormalities. The left ventricular internal cavity size was normal in size. There is  no left ventricular hypertrophy. Left ventricular diastolic parameters were normal. Right Ventricle: The right ventricular size is normal. No increase in right ventricular wall thickness. Right ventricular systolic function is normal. There is normal pulmonary artery systolic pressure. The tricuspid regurgitant velocity is 2.81 m/s, and  with an assumed right atrial pressure of 3 mmHg, the estimated right ventricular systolic pressure is 34.6 mmHg. Left Atrium: Left atrial size was normal in size. Right Atrium: Right atrial size was normal in size. Pericardium: There is no evidence of pericardial effusion. Mitral Valve: The mitral valve is normal in structure. No evidence of mitral valve  regurgitation. Tricuspid Valve: The tricuspid valve is normal in structure. Tricuspid valve regurgitation is not demonstrated. Aortic Valve: The aortic valve is normal in structure. Aortic valve regurgitation is not visualized. Aortic valve mean gradient measures 3.0 mmHg. Aortic valve peak gradient measures 5.1 mmHg. Aortic valve area, by VTI measures 3.46 cm. Pulmonic Valve: The pulmonic valve was normal in structure. Pulmonic valve regurgitation is not visualized. Aorta: The ascending aorta was not well visualized. IAS/Shunts: No atrial level shunt detected by color flow Doppler.  LEFT VENTRICLE PLAX 2D LVIDd:         3.90 cm  Diastology LVIDs:         2.60 cm  LV e' medial:    5.11 cm/s LV PW:         0.70 cm  LV E/e' medial:  16.5 LV IVS:        1.60 cm  LV e' lateral:   6.09 cm/s LVOT diam:     2.00 cm  LV E/e' lateral: 13.9 LV SV:         68 LV SV Index:   39 LVOT Area:     3.14 cm  RIGHT VENTRICLE RV S prime:     15.90 cm/s TAPSE (M-mode): 2.8 cm LEFT ATRIUM           Index LA diam:      3.10 cm 1.80 cm/m LA Vol (A2C): 21.3 ml 12.38 ml/m LA Vol (A4C): 25.0 ml 14.53 ml/m  AORTIC VALVE                   PULMONIC VALVE AV Area (Vmax):    3.22 cm  PV Vmax:       1.03 m/s AV Area (Vmean):   3.20 cm    PV Peak grad:  4.2 mmHg AV Area (VTI):     3.46 cm AV Vmax:           113.00 cm/s AV Vmean:          73.800 cm/s AV VTI:            0.196 m AV Peak Grad:      5.1 mmHg AV Mean Grad:      3.0 mmHg LVOT Vmax:         116.00 cm/s LVOT Vmean:        75.200 cm/s LVOT VTI:          0.216 m LVOT/AV VTI ratio: 1.10  AORTA Ao Root diam: 3.60 cm MV E velocity: 84.40 cm/s   TRICUSPID VALVE MV A velocity: 114.00 cm/s  TR Peak grad:   31.6 mmHg MV E/A ratio:  0.74         TR Vmax:        281.00 cm/s                              SHUNTS                             Systemic VTI:  0.22 m                             Systemic Diam: 2.00 cm Alwyn Pea MD Electronically signed by Alwyn Pea MD Signature  Date/Time: 05/23/2021/12:11:18 PM    Final     Right Foot First Ray Amputation 05/28/2021   Subjective: Pt doing well today.  Awake, interactive.  Denies pain, SOB, F/C or other complaints.  States feels well.  No acute events reported overnight.     Discharge Exam: Vitals:   06/07/21 0657 06/07/21 0727  BP:  102/69  Pulse:  71  Resp:  18  Temp:  97.8 F (36.6 C)  SpO2: (P) 100% 97%   Vitals:   06/06/21 1937 06/07/21 0531 06/07/21 0657 06/07/21 0727  BP: 129/71 125/69  102/69  Pulse: 86 79  71  Resp: 17 17  18   Temp: 98 F (36.7 C) 97.7 F (36.5 C)  97.8 F (36.6 C)  TempSrc: Oral Oral  Oral  SpO2: 98%  (P) 100% 97%  Weight:      Height:        General: Pt is alert, awake, not in acute distress, frail, underweight Cardiovascular: RRR, S1/S2 +, no rubs, no gallops Respiratory: CTA bilaterally, no wheezing, no rhonchi Abdominal: Soft, NT, ND, bowel sounds + Extremities: Right foot dressing clean,dry,intact, no edema, no cyanosis    The results of significant diagnostics from this hospitalization (including imaging, microbiology, ancillary and laboratory) are listed below for reference.     Microbiology: Recent Results (from the past 240 hour(s))  Aerobic/Anaerobic Culture w Gram Stain (surgical/deep wound)     Status: Abnormal   Collection Time: 05/28/21  1:36 PM   Specimen: PATH Other; Tissue  Result Value Ref Range Status   Specimen Description   Final    WOUND Performed at [facility], [address], [place], [place] [address]    Special Requests RIGHT SESAMOID BONE  Final   Gram  Stain   Final    RARE WBC PRESENT,BOTH PMN AND MONONUCLEAR RARE GRAM POSITIVE COCCI RARE GRAM POSITIVE RODS    Culture (A)  Final    MULTIPLE ORGANISMS PRESENT, NONE PREDOMINANT NO GROUP A STREP (S.PYOGENES) ISOLATED NO STAPHYLOCOCCUS AUREUS ISOLATED NO ANAEROBES ISOLATED Performed at [facility], [address] [place], [place] [address]    Report Status  06/03/2021 FINAL  Final  Resp Panel by RT-PCR (Flu A&B, Covid) Nasopharyngeal Swab     Status: None   Collection Time: 06/02/21  6:17 AM   Specimen: Nasopharyngeal Swab; Nasopharyngeal(NP) swabs in vial transport medium  Result Value Ref Range Status   SARS Coronavirus 2 by RT PCR NEGATIVE NEGATIVE Final    Comment: (NOTE) SARS-CoV-2 target nucleic acids are NOT DETECTED.  The SARS-CoV-2 RNA is generally detectable in upper respiratory specimens during the acute phase of infection. The lowest concentration of SARS-CoV-2 viral copies this assay can detect is 138 copies/mL. A negative result does not preclude SARS-Cov-2 infection and should not be used as the sole basis for treatment or other patient management decisions. A negative result may occur with  improper specimen collection/handling, submission of specimen other than nasopharyngeal swab, presence of viral mutation(s) within the areas targeted by this assay, and inadequate number of viral copies(<138 copies/mL). A negative result must be combined with clinical observations, patient history, and epidemiological information. The expected result is Negative.  Fact Sheet for Patients:  BloggerCourse.com  Fact Sheet for Healthcare Providers:  SeriousBroker.it  This test is no t yet approved or cleared by the [place] FDA and  has been authorized for detection and/or diagnosis of SARS-CoV-2 by FDA under an Emergency Use Authorization (EUA). This EUA will remain  in effect (meaning this test can be used) for the duration of the COVID-19 declaration under Section 564(b)(1) of the Act, 21 U.S.C.section 360bbb-3(b)(1), unless the authorization is terminated  or revoked sooner.       Influenza A by PCR NEGATIVE NEGATIVE Final   Influenza B by PCR NEGATIVE NEGATIVE Final    Comment: (NOTE) The Xpert Xpress SARS-CoV-2/FLU/RSV plus assay is intended as an aid in the diagnosis of  influenza from Nasopharyngeal swab specimens and should not be used as a sole basis for treatment. Nasal washings and aspirates are unacceptable for Xpert Xpress SARS-CoV-2/FLU/RSV testing.  Fact Sheet for Patients: BloggerCourse.com  Fact Sheet for Healthcare Providers: SeriousBroker.it  This test is not yet approved or cleared by the [place] FDA and has been authorized for detection and/or diagnosis of SARS-CoV-2 by FDA under an Emergency Use Authorization (EUA). This EUA will remain in effect (meaning this test can be used) for the duration of the COVID-19 declaration under Section 564(b)(1) of the Act, 21 U.S.C. section 360bbb-3(b)(1), unless the authorization is terminated or revoked.  Performed at [facility], [address] [place], [place] [address]      Labs: BNP (last 3 results) Recent Labs    05/24/21 0422 05/25/21 0535 05/26/21 0423  BNP 138.3* 43.4 53.6   Basic Metabolic Panel: Recent Labs  Lab 06/01/21 0733 06/02/21 0627 06/03/21 0454 06/04/21 0440 06/05/21 0520 06/06/21 0406 06/07/21 0541  NA 140  --  138  --  141  --  141  K 4.0  --  4.2  --  4.0  --  3.7  CL 102  --  103  --  106  --  106  CO2 31  --  28  --  27  --  29  GLUCOSE 130*   < > 130* 130* 118* 115* 124*  BUN 26*  --  24*  --  32*  --  35*  CREATININE 0.73  --  0.75  --  0.80  --  0.87  CALCIUM 9.6  --  9.6  --  9.9  --  9.9   < > = values in this interval not displayed.   Liver Function Tests: No results for input(s): AST, ALT, ALKPHOS, BILITOT, PROT, ALBUMIN in the last 168 hours. No results for input(s): LIPASE, AMYLASE in the last 168 hours. No results for input(s): AMMONIA in the last 168 hours. CBC: No results for input(s): WBC, NEUTROABS, HGB, HCT, MCV, PLT in the last 168 hours. Cardiac Enzymes: No results for input(s): CKTOTAL, CKMB, CKMBINDEX, TROPONINI in the last 168 hours. BNP: Invalid input(s):  POCBNP CBG: No results for input(s): GLUCAP in the last 168 hours. D-Dimer No results for input(s): DDIMER in the last 72 hours. Hgb A1c No results for input(s): HGBA1C in the last 72 hours. Lipid Profile No results for input(s): CHOL, HDL, LDLCALC, TRIG, CHOLHDL, LDLDIRECT in the last 72 hours. Thyroid function studies No results for input(s): TSH, T4TOTAL, T3FREE, THYROIDAB in the last 72 hours.  Invalid input(s): FREET3 Anemia work up No results for input(s): VITAMINB12, FOLATE, FERRITIN, TIBC, IRON, RETICCTPCT in the last 72 hours. Urinalysis    Component Value Date/Time   COLORURINE YELLOW (A) 05/19/2021 1526   APPEARANCEUR CLEAR (A) 05/19/2021 1526   LABSPEC 1.017 05/19/2021 1526   PHURINE 6.0 05/19/2021 1526   GLUCOSEU NEGATIVE 05/19/2021 1526   HGBUR NEGATIVE 05/19/2021 1526   BILIRUBINUR NEGATIVE 05/19/2021 1526   KETONESUR NEGATIVE 05/19/2021 1526   PROTEINUR NEGATIVE 05/19/2021 1526   NITRITE NEGATIVE 05/19/2021 1526   LEUKOCYTESUR NEGATIVE 05/19/2021 1526   Sepsis Labs Invalid input(s): PROCALCITONIN,  WBC,  LACTICIDVEN Microbiology Recent Results (from the past 240 hour(s))  Aerobic/Anaerobic Culture w Gram Stain (surgical/deep wound)     Status: Abnormal   Collection Time: 05/28/21  1:36 PM   Specimen: PATH Other; Tissue  Result Value Ref Range Status   Specimen Description   Final    WOUND Performed at [facility], [address], [place], [place] [address]    Special Requests RIGHT SESAMOID BONE  Final   Gram Stain   Final    RARE WBC PRESENT,BOTH PMN AND MONONUCLEAR RARE GRAM POSITIVE COCCI RARE GRAM POSITIVE RODS    Culture (A)  Final    MULTIPLE ORGANISMS PRESENT, NONE PREDOMINANT NO GROUP A STREP (S.PYOGENES) ISOLATED NO STAPHYLOCOCCUS AUREUS ISOLATED NO ANAEROBES ISOLATED Performed at [facility], [address] [place], [place] [address]    Report Status 06/03/2021 FINAL  Final  Resp Panel by RT-PCR (Flu A&B, Covid)  Nasopharyngeal Swab     Status: None   Collection Time: 06/02/21  6:17 AM   Specimen: Nasopharyngeal Swab; Nasopharyngeal(NP) swabs in vial transport medium  Result Value Ref Range Status   SARS Coronavirus 2 by RT PCR NEGATIVE NEGATIVE Final    Comment: (NOTE) SARS-CoV-2 target nucleic acids are NOT DETECTED.  The SARS-CoV-2 RNA is generally detectable in upper respiratory specimens during the acute phase of infection. The lowest concentration of SARS-CoV-2 viral copies this assay can detect is 138 copies/mL. A negative result does not preclude SARS-Cov-2 infection and should not be used as the sole basis for treatment or other patient management decisions. A negative result may occur with  improper specimen collection/handling, submission of specimen other than nasopharyngeal swab, presence of viral mutation(s) within the areas targeted by this assay, and inadequate number of viral copies(<138 copies/mL). A negative result must be combined with clinical observations, patient history, and epidemiological information. The expected result is Negative.  Fact Sheet for Patients:  BloggerCourse.com  Fact Sheet for Healthcare Providers:  SeriousBroker.it  This test is no t yet approved or cleared by the [place] FDA and  has been authorized for detection and/or diagnosis of SARS-CoV-2 by FDA under an Emergency Use Authorization (EUA). This EUA will remain  in effect (meaning this test can be used) for the duration of the COVID-19 declaration under Section 564(b)(1) of the Act, 21 U.S.C.section 360bbb-3(b)(1), unless the authorization is terminated  or revoked sooner.       Influenza A by PCR NEGATIVE NEGATIVE Final   Influenza B by PCR NEGATIVE NEGATIVE Final    Comment: (NOTE) The Xpert Xpress SARS-CoV-2/FLU/RSV plus assay is intended as an aid in the diagnosis of influenza from Nasopharyngeal swab specimens and should not be  used as a sole basis for treatment. Nasal washings and aspirates are unacceptable for Xpert Xpress SARS-CoV-2/FLU/RSV testing.  Fact Sheet for Patients: BloggerCourse.com  Fact Sheet for Healthcare Providers: SeriousBroker.it  This test is not yet approved or cleared by the [place] FDA and has been authorized for detection and/or diagnosis of SARS-CoV-2 by FDA under an Emergency Use Authorization (EUA). This EUA will remain in effect (meaning this test can be used) for the duration of the COVID-19 declaration under Section 564(b)(1) of the Act, 21 U.S.C. section 360bbb-3(b)(1), unless the authorization is terminated or revoked.  Performed at [facility], [address] [place], [place] [address]      Time coordinating discharge: Over 30 minutes  SIGNED:   Pennie Banter, DO [facility] 06/07/2021, 8:59 AM   If 7PM-7AM, please contact night-coverage [email or web address]

## 2021-06-07 NOTE — TOC Transition Note (Addendum)
Transition of Care [facility]) - CM/SW Discharge Note   Patient Details  Name: Rodney Kelly MRN: [id number] Date of Birth: Aug 31, 1924  [facility] CM/SW Contact:  Caryn Section, RN Phone Number: 06/07/2021, 8:54 AM   Clinical Narrative: Addendum 1108am:  Revonda Standard called back and stated [facility] refuses to take patient today, will take tomorrow.  [facility] will pick patient up at 1130 am tomorrow.  Care team and family aware    As per Revonda Standard, patient is able to be admitted to [facility] today.  Patient has not had a sitter, and [facility] is able to accept admissions.  [facility] will pick patient up at 130 pm.  Care team, patient and family aware.       Barriers to Discharge: Continued Medical Work up   Patient Goals and CMS Choice Patient states their goals for this hospitalization and ongoing recovery are:: For patient to return home with additional support.      Discharge Placement                       Discharge Plan and Services   Discharge Planning Services: CM Consult Post Acute Care Choice: Home Health                               Social Determinants of Health (SDOH) Interventions     Readmission Risk Interventions No flowsheet data found.

## 2021-06-08 LAB — GLUCOSE, RANDOM: Glucose, Bld: 122 mg/dL — ABNORMAL HIGH (ref 70–99)

## 2021-06-08 MED ORDER — PREGABALIN 75 MG PO CAPS: 75.0000 mg | ORAL_CAPSULE | Freq: Two times a day (BID) | ORAL | 0 refills | Status: AC

## 2021-06-08 NOTE — Progress Notes (Signed)
Patient is scheduled to discharge to [facility] today and will be transported by [employer]. Attempted to call report to [phone] and line just rang. Called main desk, [phone], and was told no one available to receive report at this time. Left my name and number and requested a call back once someone is available.

## 2021-06-08 NOTE — TOC Transition Note (Signed)
Transition of Care [facility]) - CM/SW Discharge Note   Patient Details  Name: Rodney Kelly MRN: [id number] Date of Birth: 05/14/1924  Transition of Care [facility]) CM/SW Contact:  Caryn Section, RN Phone Number: 06/08/2021, 10:05 AM   Clinical Narrative:   Patient will discharge today to [facility] in [place], [place] by [facility] at 1130am.  Bunnie Domino is aware of discharge today.  Revonda Standard at [facility] confirms transfer today as well.      Barriers to Discharge: Continued Medical Work up   Patient Goals and CMS Choice Patient states their goals for this hospitalization and ongoing recovery are:: For patient to return home with additional support.      Discharge Placement                       Discharge Plan and Services   Discharge Planning Services: CM Consult Post Acute Care Choice: Home Health                               Social Determinants of Health (SDOH) Interventions     Readmission Risk Interventions No flowsheet data found.

## 2022-05-28 IMAGING — MR MR FOOT*R* WO/W CM
10 series · 40 of 40 positions shown · IV contrast (6ml Gadavist)
Comparison: None.

CLINICAL DATA: Foot pain and swelling. Open wound on the plantar
aspect great toe.

EXAM:
MRI OF THE RIGHT FOREFOOT WITHOUT AND WITH CONTRAST
TECHNIQUE: Multiplanar, multisequence MR imaging of the right foot was
performed before and after the administration of intravenous
contrast.
CONTRAST:  6mL GADAVIST GADOBUTROL 1 MMOL/ML IV SOLN

[Series 5: T1 · coronal · right · 3.0mm · 0.38mm/px · 6 of 45 slices shown (1 of 2)]
[im 1/45]
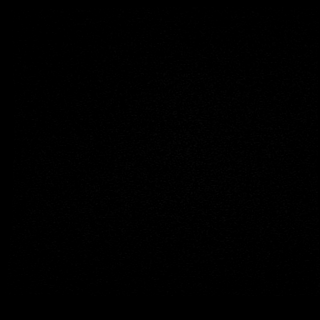
[im 9/45]
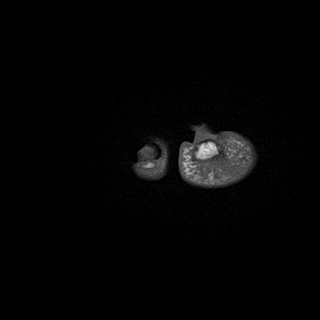
[im 18/45]
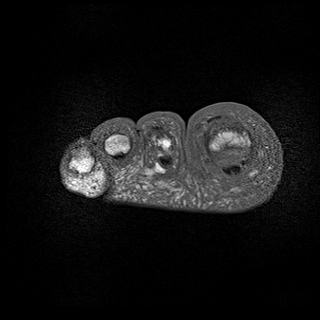
[im 27/45]
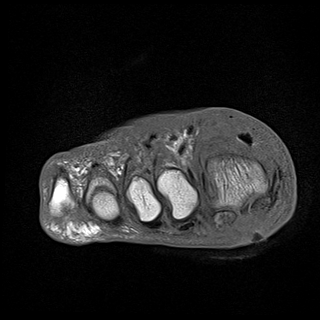
[im 36/45]
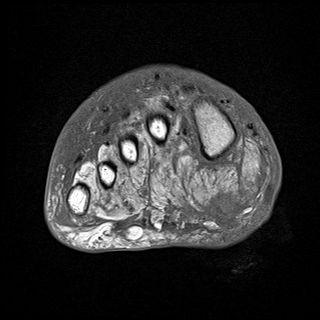
[im 45/45]
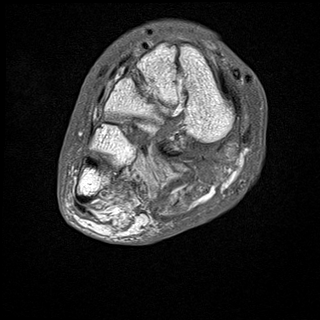

[Series 7: T2 · coronal · right · 3.0mm · 0.38mm/px · 6 of 45 slices shown (1 of 2)]
[im 1/45]
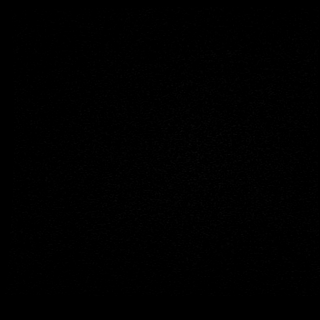
[im 9/45]
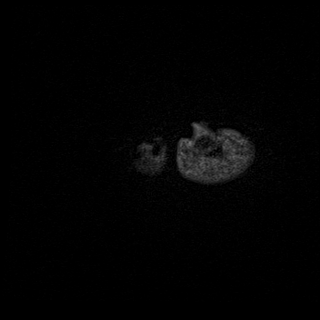
[im 18/45]
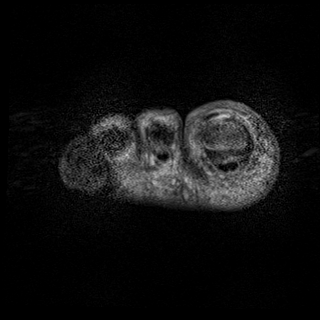
[im 27/45]
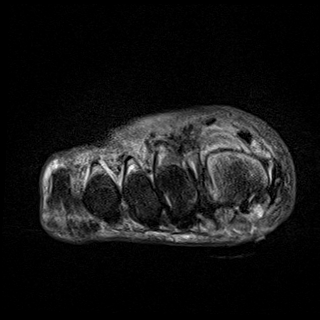
[im 36/45]
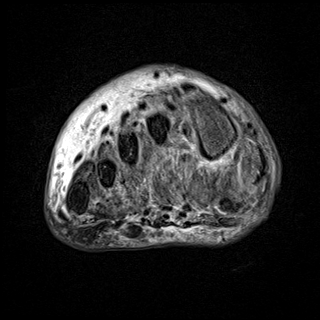
[im 45/45]
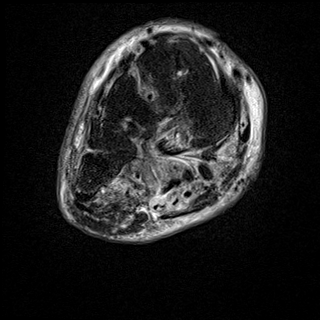

[Series 8: T1 · axial · right · 3.0mm · 0.70mm/px · z∈[-124,-52]mm · 3 of 20 slices shown (2 of 2)]
[im 1/20]
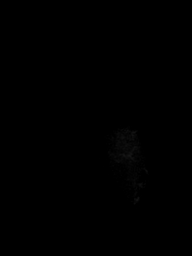
[im 10/20]
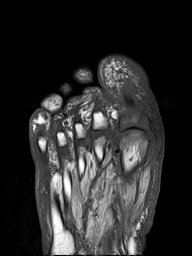
[im 20/20]
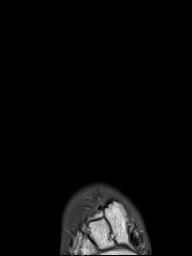

[Series 10: T2 · axial · right · 3.0mm · 0.70mm/px · z∈[-119,-47]mm · 2 of 20 slices shown (2 of 2)]
[im 1/20]
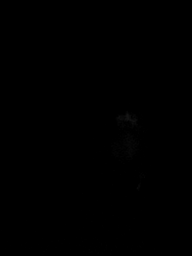
[im 20/20]
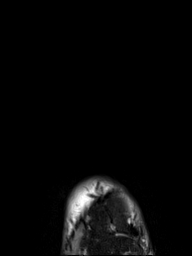

[Series 11: STIR · sagittal · right · 3.0mm · 0.62mm/px · 3 of 29 slices shown]
[im 1/29]
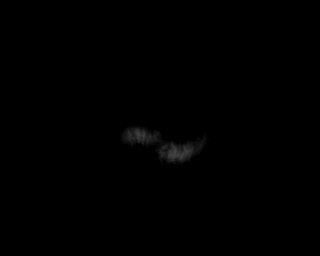
[im 15/29]
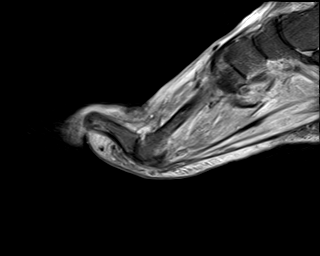
[im 29/29]
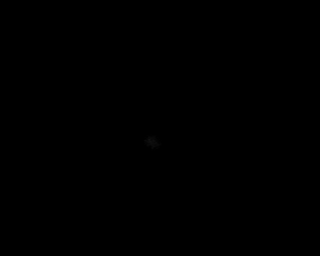

[Series 12: T1 fat-sat · coronal · non-contrast · right · 3.0mm · 0.47mm/px · 5 of 45 slices shown (1 of 3)]
[im 1/45]
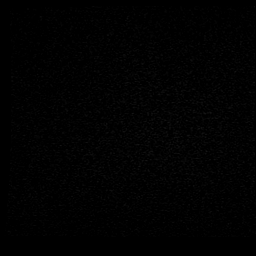
[im 12/45]
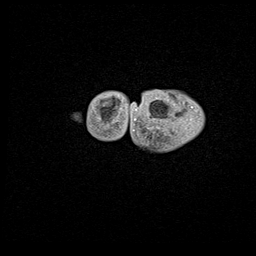
[im 23/45]
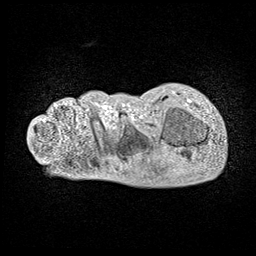
[im 34/45]
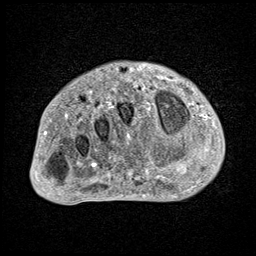
[im 45/45]
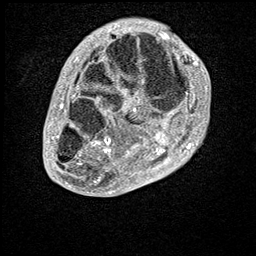

[Series 13: T1 fat-sat post-contrast · coronal · right · 3.0mm · 0.47mm/px · 5 of 45 slices shown (1 of 2)]
[im 1/45]
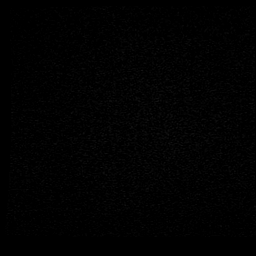
[im 12/45]
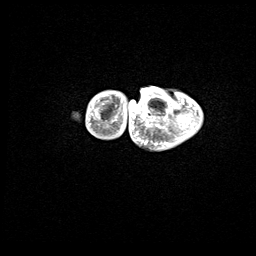
[im 23/45]
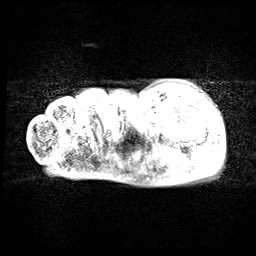
[im 34/45]
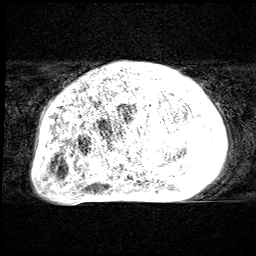
[im 45/45]
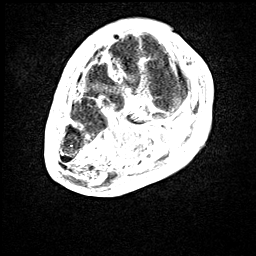

[Series 14: T1 fat-sat · sagittal · right · 3.0mm · 0.62mm/px · 3 of 28 slices shown (2 of 3)]
[im 1/28]
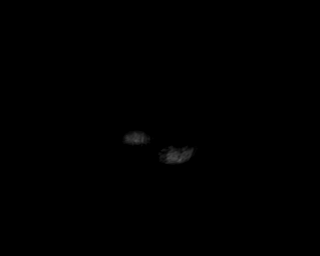
[im 14/28]
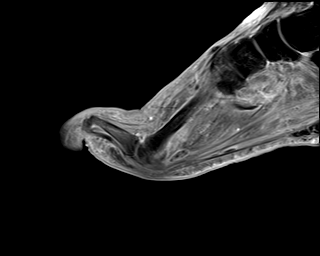
[im 28/28]
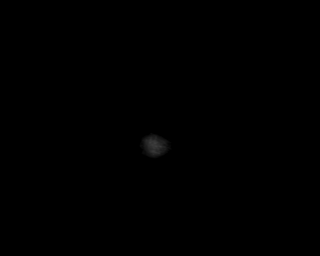

[Series 15: T1 fat-sat · axial · right · 3.0mm · 0.56mm/px · z∈[-124,-52]mm · 2 of 20 slices shown (3 of 3)]
[im 1/20]
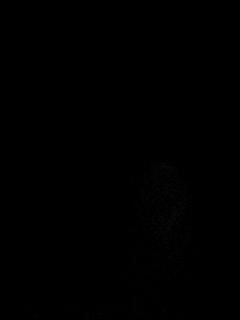
[im 20/20]
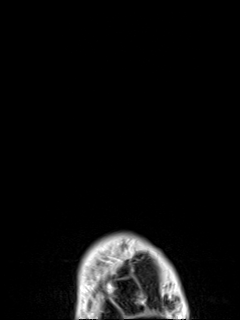

[Series 16: T1 fat-sat post-contrast · coronal · right · 3.0mm · 0.47mm/px · 5 of 45 slices shown (2 of 2)]
[im 1/45]
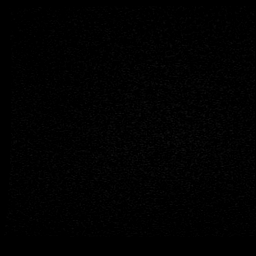
[im 12/45]
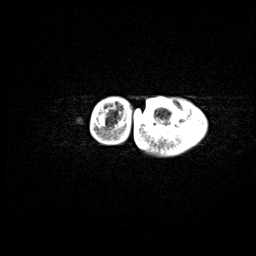
[im 23/45]
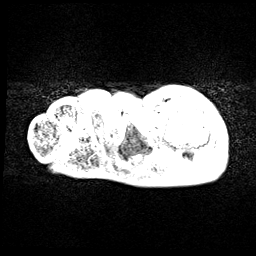
[im 34/45]
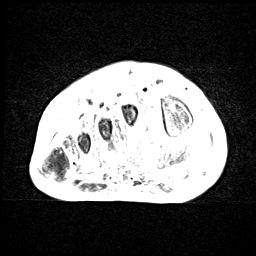
[im 45/45]
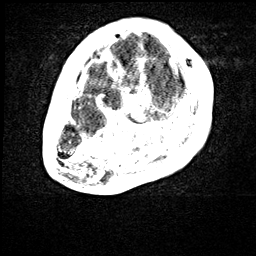

[40 of 40 positions shown; findings below may reference images not displayed]

FINDINGS: There is a small open wound on the plantar aspect of the medial
forefoot at the level of the first MTP joint. There is a small rim
enhancing abscess measuring approximately 15 x 13. Associated
osteomyelitis involving medial sesamoid of the great toe.

Abnormal T1 and T2 signal intensity and enhancement in the proximal
phalanx of the great toe consistent with osteomyelitis. I do not see
any definite findings for septic arthritis at the first MTP joint.
Suspect osteomyelitis involving first metatarsal head along its
plantar aspect.

The other bony structures are intact.

Diffuse cellulitis myofasciitis without definite findings for
pyomyositis.
IMPRESSION: 1. Small open wound on the plantar aspect of the medial forefoot at
the level of the first MTP joint. Associated underlying soft tissue
abscess.
2. Associated osteomyelitis involving the medial sesamoid of the
great toe and also the proximal phalanx of the great toe.
3. Suspect osteomyelitis involving the first metatarsal head along
its plantar aspect.
4. Diffuse cellulitis and myofasciitis without definite findings
pyomyositis.

## 2022-05-28 IMAGING — US US EXTREM LOW VENOUS*R*
1 series · 13 of 24 positions shown · non-contrast
Comparison: None.

CLINICAL DATA: Lower extremity edema

EXAM:
RIGHT LOWER EXTREMITY VENOUS DUPLEX ULTRASOUND
TECHNIQUE: Gray-scale sonography with graded compression, as well as color
Doppler and duplex ultrasound were performed to evaluate the right
lower extremity deep venous system from the level of the common
femoral vein and including the common femoral, femoral, profunda
femoral, popliteal and calf veins including the posterior tibial,
peroneal and gastrocnemius veins when visible. The superficial great
saphenous vein was also interrogated. Spectral Doppler was utilized
to evaluate flow at rest and with distal augmentation maneuvers in
the common femoral, femoral and popliteal veins.

[Series 1: us venous img lower uni right (dvt) · portal-venous · 13 of 35 slices shown]
[im 1/35]
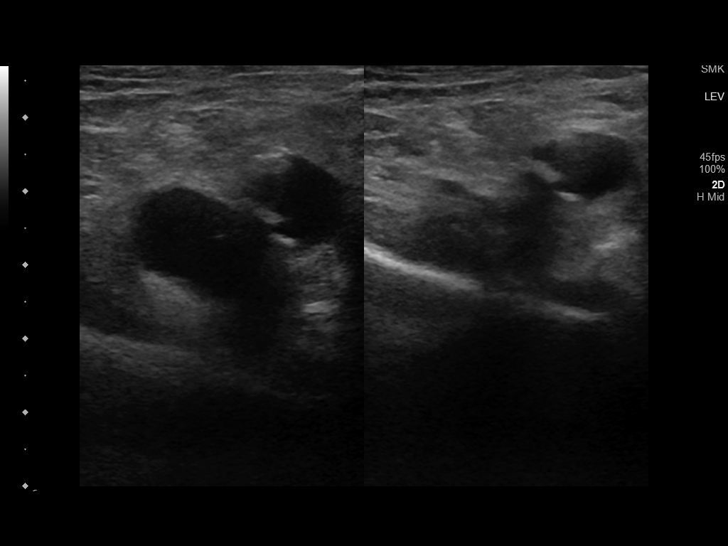
[im 3/35]
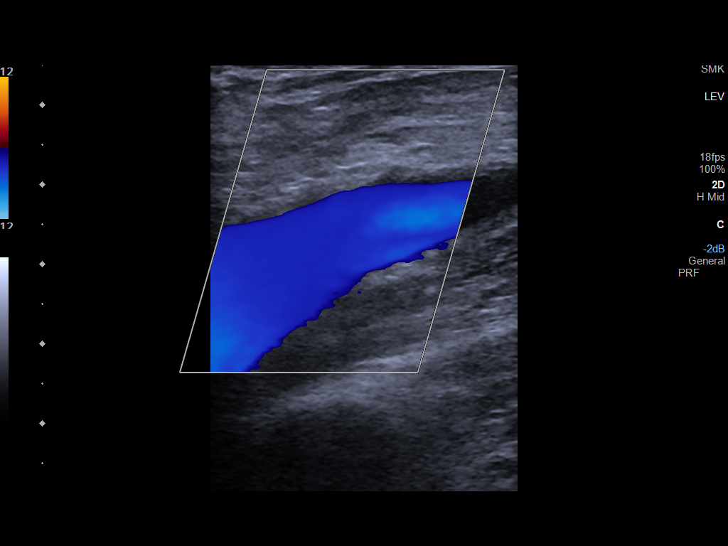
[im 6/35]
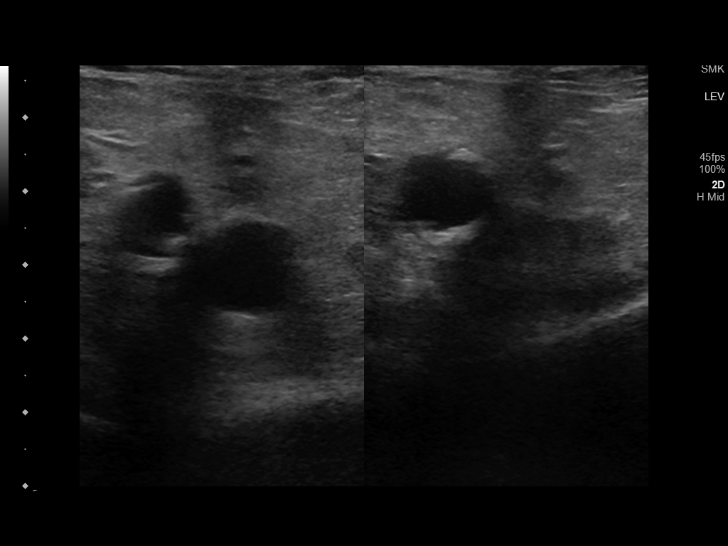
[im 9/35]
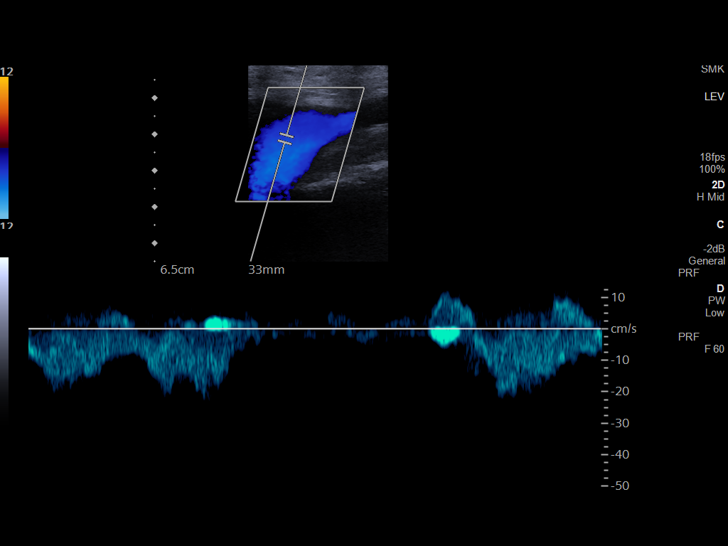
[im 12/35]
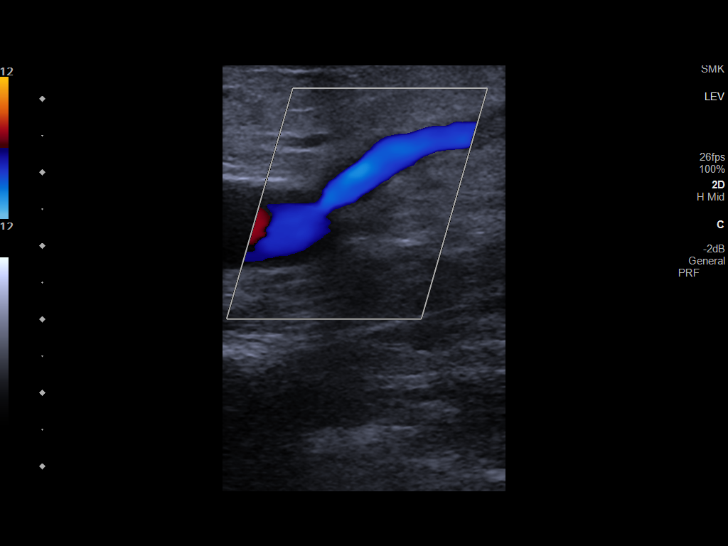
[im 15/35]
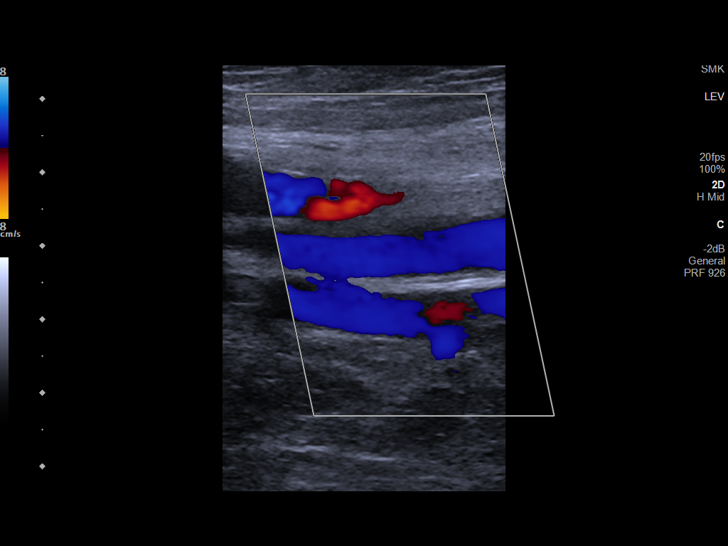
[im 18/35]
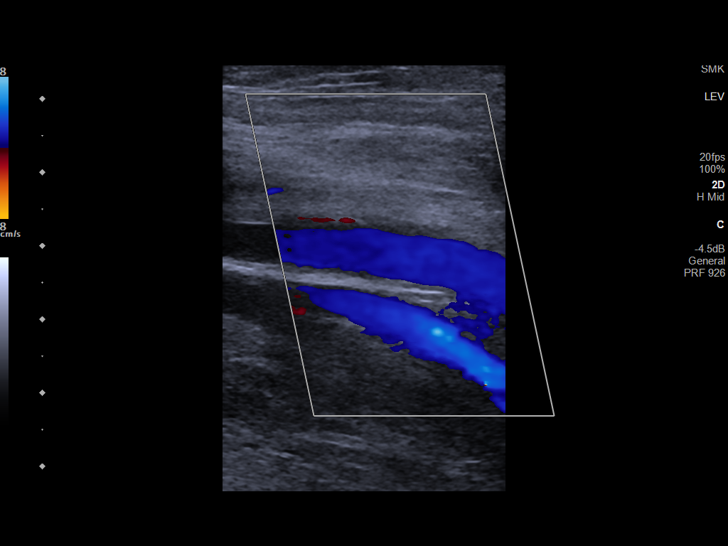
[im 20/35]
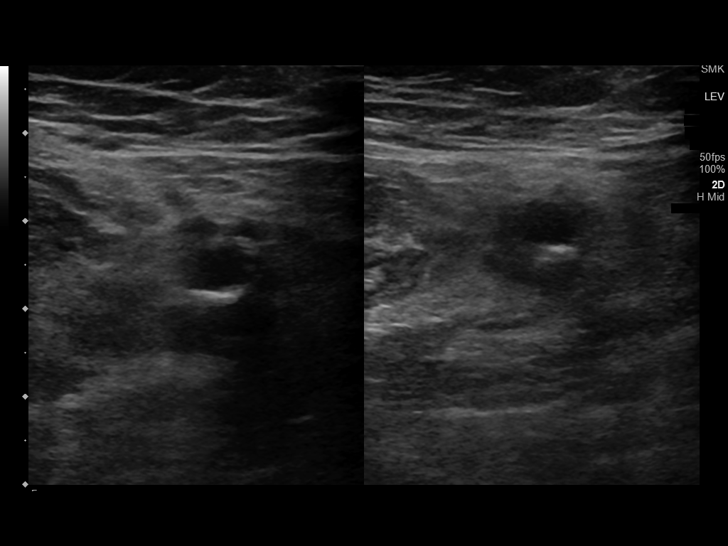
[im 23/35]
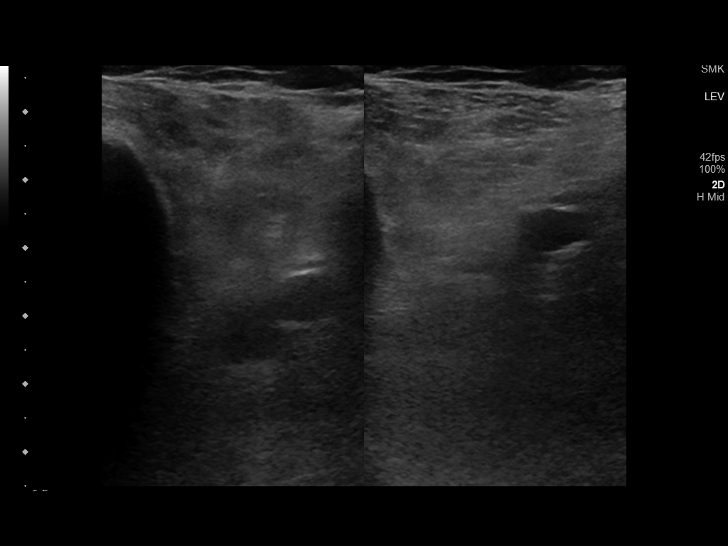
[im 26/35]
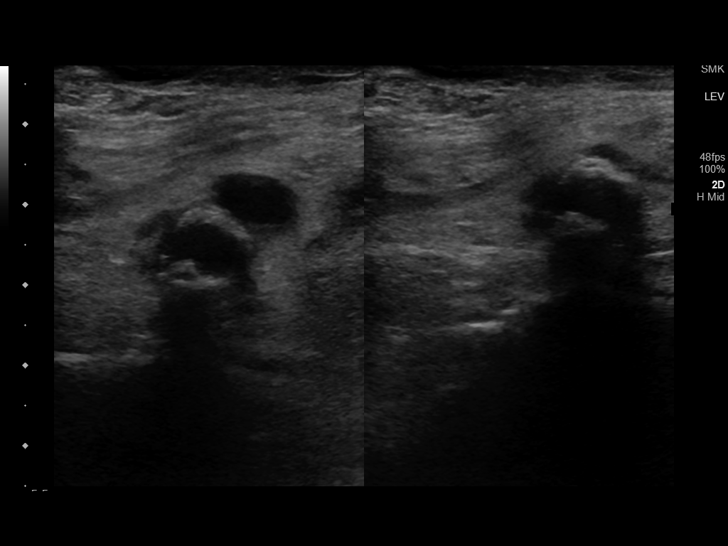
[im 29/35]
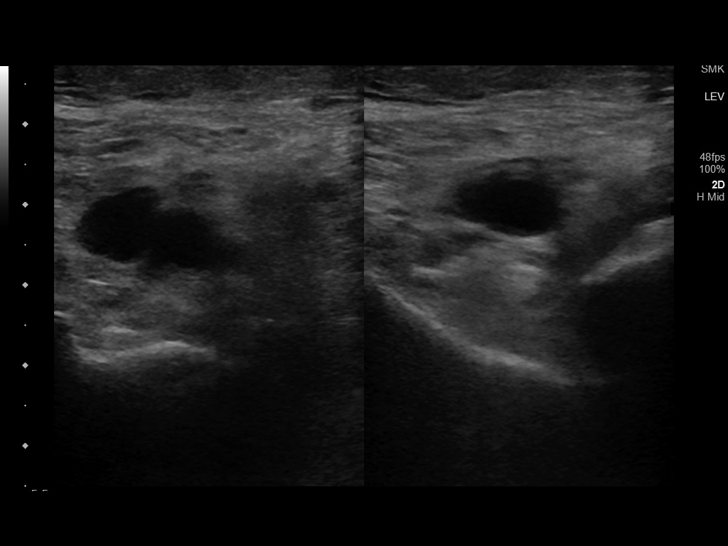
[im 32/35]
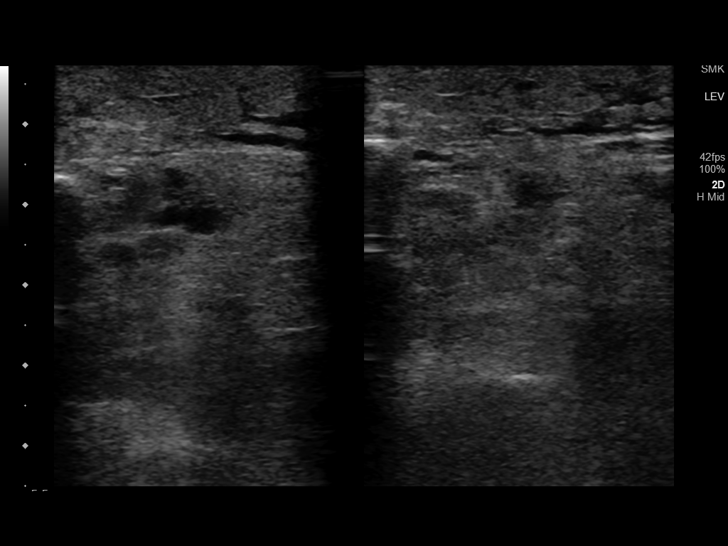
[im 35/35]
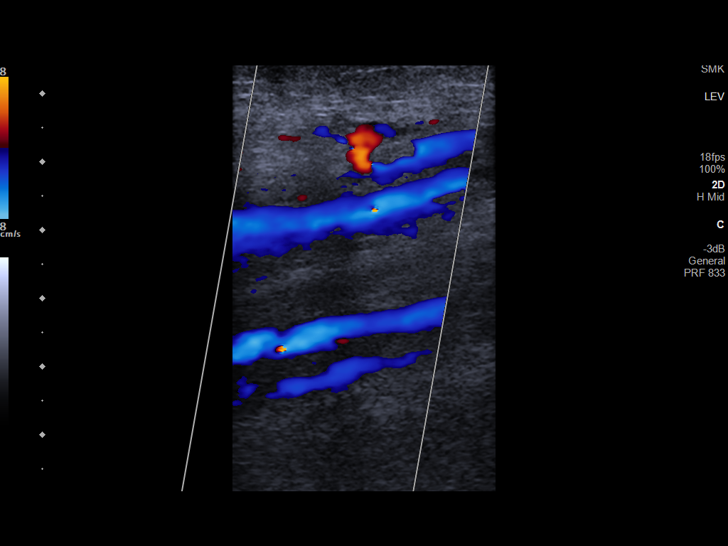

[13 of 24 positions shown; findings below may reference images not displayed]

FINDINGS: Contralateral Common Femoral Vein: Respiratory phasicity is normal
and symmetric with the symptomatic side. No evidence of thrombus.
Normal compressibility.

Common Femoral Vein: No evidence of thrombus. Normal
compressibility, respiratory phasicity and response to augmentation.

Saphenofemoral Junction: No evidence of thrombus. Normal
compressibility and flow on color Doppler imaging.

Profunda Femoral Vein: No evidence of thrombus. Normal
compressibility and flow on color Doppler imaging.

Femoral Vein: No evidence of thrombus. Normal compressibility,
respiratory phasicity and response to augmentation.

Popliteal Vein: No evidence of thrombus. Normal compressibility,
respiratory phasicity and response to augmentation.

Calf Veins: No evidence of thrombus. Normal compressibility and flow
on color Doppler imaging.

Superficial Great Saphenous Vein: No evidence of thrombus. Normal
compressibility.

Venous Reflux:  None.

Other Findings:  None.
IMPRESSION: No evidence of deep venous thrombosis in the right lower extremity.
Left common femoral vein also patent.

## 2022-06-04 DEATH — deceased
